# Patient Record
Sex: Male | Born: 1965
Health system: Southern US, Community
[De-identification: ages and names within clinical notes are randomized; demographics above are authoritative.]

## PROBLEM LIST (undated history)

## (undated) DIAGNOSIS — G4733 Obstructive sleep apnea (adult) (pediatric): Secondary | ICD-10-CM

## (undated) DIAGNOSIS — F99 Mental disorder, not otherwise specified: Secondary | ICD-10-CM

## (undated) DIAGNOSIS — G4730 Sleep apnea, unspecified: Secondary | ICD-10-CM

## (undated) DIAGNOSIS — K118 Other diseases of salivary glands: Secondary | ICD-10-CM

## (undated) DIAGNOSIS — K219 Gastro-esophageal reflux disease without esophagitis: Secondary | ICD-10-CM

## (undated) DIAGNOSIS — F32A Depression, unspecified: Secondary | ICD-10-CM

## (undated) DIAGNOSIS — F329 Major depressive disorder, single episode, unspecified: Secondary | ICD-10-CM

## (undated) DIAGNOSIS — N209 Urinary calculus, unspecified: Secondary | ICD-10-CM

## (undated) DIAGNOSIS — Z8719 Personal history of other diseases of the digestive system: Secondary | ICD-10-CM

## (undated) DIAGNOSIS — F419 Anxiety disorder, unspecified: Secondary | ICD-10-CM

## (undated) HISTORY — DX: Anxiety disorder, unspecified: F41.9

## (undated) HISTORY — DX: Obstructive sleep apnea (adult) (pediatric): G47.33

---

## 1999-01-18 ENCOUNTER — Emergency Department (HOSPITAL_COMMUNITY): Admission: EM | Admit: 1999-01-18 | Discharge: 1999-01-18 | Payer: Self-pay | Admitting: Emergency Medicine

## 1999-01-18 ENCOUNTER — Encounter: Payer: Self-pay | Admitting: Emergency Medicine

## 2000-09-28 ENCOUNTER — Encounter (INDEPENDENT_AMBULATORY_CARE_PROVIDER_SITE_OTHER): Payer: Self-pay | Admitting: Specialist

## 2000-09-28 ENCOUNTER — Ambulatory Visit (HOSPITAL_BASED_OUTPATIENT_CLINIC_OR_DEPARTMENT_OTHER): Admission: RE | Admit: 2000-09-28 | Discharge: 2000-09-28 | Payer: Self-pay | Admitting: Unknown Physician Specialty

## 2001-07-24 ENCOUNTER — Emergency Department (HOSPITAL_COMMUNITY): Admission: EM | Admit: 2001-07-24 | Discharge: 2001-07-24 | Payer: Self-pay | Admitting: Emergency Medicine

## 2001-07-24 ENCOUNTER — Encounter: Payer: Self-pay | Admitting: Emergency Medicine

## 2003-10-28 ENCOUNTER — Encounter (INDEPENDENT_AMBULATORY_CARE_PROVIDER_SITE_OTHER): Payer: Self-pay | Admitting: *Deleted

## 2003-10-28 ENCOUNTER — Ambulatory Visit (HOSPITAL_COMMUNITY): Admission: RE | Admit: 2003-10-28 | Discharge: 2003-10-28 | Payer: Self-pay | Admitting: *Deleted

## 2004-05-08 ENCOUNTER — Encounter: Admission: RE | Admit: 2004-05-08 | Discharge: 2004-05-08 | Payer: Self-pay | Admitting: Neurological Surgery

## 2005-05-29 ENCOUNTER — Encounter: Admission: RE | Admit: 2005-05-29 | Discharge: 2005-05-29 | Payer: Self-pay | Admitting: Family Medicine

## 2009-04-20 ENCOUNTER — Emergency Department (HOSPITAL_COMMUNITY): Admission: EM | Admit: 2009-04-20 | Discharge: 2009-04-20 | Payer: Self-pay | Admitting: Emergency Medicine

## 2009-11-03 ENCOUNTER — Emergency Department (HOSPITAL_COMMUNITY): Admission: EM | Admit: 2009-11-03 | Discharge: 2009-11-03 | Payer: Self-pay | Admitting: Emergency Medicine

## 2010-06-22 ENCOUNTER — Emergency Department (HOSPITAL_COMMUNITY): Admission: EM | Admit: 2010-06-22 | Discharge: 2010-06-23 | Payer: Self-pay | Admitting: Emergency Medicine

## 2010-10-25 LAB — URINALYSIS, ROUTINE W REFLEX MICROSCOPIC
Specific Gravity, Urine: 1.032 — ABNORMAL HIGH (ref 1.005–1.030)
Urobilinogen, UA: 0.2 mg/dL (ref 0.0–1.0)
pH: 5 (ref 5.0–8.0)

## 2010-10-25 LAB — POCT I-STAT, CHEM 8
Calcium, Ion: 1.19 mmol/L (ref 1.12–1.32)
Glucose, Bld: 124 mg/dL — ABNORMAL HIGH (ref 70–99)
HCT: 50 % (ref 39.0–52.0)
Hemoglobin: 17 g/dL (ref 13.0–17.0)
Potassium: 4.7 mEq/L (ref 3.5–5.1)

## 2010-10-25 LAB — URINE MICROSCOPIC-ADD ON

## 2010-11-06 LAB — BASIC METABOLIC PANEL
BUN: 17 mg/dL (ref 6–23)
Calcium: 9.2 mg/dL (ref 8.4–10.5)
Creatinine, Ser: 0.99 mg/dL (ref 0.4–1.5)
GFR calc non Af Amer: >60 mL/min (ref >60)
Glucose, Bld: 99 mg/dL (ref 70–99)

## 2010-11-06 LAB — URINALYSIS, ROUTINE W REFLEX MICROSCOPIC
Bilirubin Urine: NEGATIVE
Ketones, ur: NEGATIVE mg/dL
Nitrite: NEGATIVE
Specific Gravity, Urine: 1.031 — ABNORMAL HIGH (ref 1.005–1.030)
Urobilinogen, UA: 0.2 mg/dL (ref 0.0–1.0)

## 2010-11-06 LAB — DIFFERENTIAL
Basophils Absolute: 0 10*3/uL (ref 0.0–0.1)
Lymphocytes Relative: 42 % (ref 12–46)
Neutro Abs: 4.5 10*3/uL (ref 1.7–7.7)

## 2010-11-06 LAB — CBC
Platelets: 245 10*3/uL (ref 150–400)
RDW: 13.9 % (ref 11.5–15.5)

## 2010-11-18 LAB — GLUCOSE, CAPILLARY: Glucose-Capillary: 108 mg/dL — ABNORMAL HIGH (ref 70–99)

## 2010-12-30 NOTE — Op Note (Signed)
[facility]  Patient:    Matthew [name], Matthew [name]                         MRN: [id number] Proc. Date: 09/28/00 Adm. Date:  [id number] Disc. Date: [id number] Attending:  Caleen Essex                           Operative Report  PREOPERATIVE DIAGNOSIS:  Mass on his right low back.  POSTOPERATIVE DIAGNOSIS:  Mass on his right low back.  PROCEDURE:  Excision of the mass on the right low back.  SURGEON:  Chevis Pretty, M.D.  ANESTHESIA:  Local with 1% lidocaine with epinephrine.  DESCRIPTION OF PROCEDURE:  After informed consent was obtained, the patient was brought to the operating room and placed in the prone position on the operating room table.  After patients right low back area was prepped with Betadine and draped in the usual sterile manner, the area in question was then infiltrated with 1% lidocaine with epinephrine, and this was massaged into the tissue for several minutes.  An elliptical incision was used to carve out the area in question where previous scar tissue had been.  This was approximately 4-5 cm in diameter.  The incision was carried down through the skin and subcutaneous tissues with the 15 blade knife until the entire area in question had been excised.  The tissue in the base of the wound all appeared to be healthy subcutaneous tissue.  Hemostasis was achieved using the Bovie electrocautery, and the incision was then closed with multiple interrupted vertical mattress sutures using a 2-0 nylon stitch.  Sterile dressing was applied.  The patient tolerated the procedure well.  At the end of the case, all needle, sponge, and instrument counts were correct.  Patient was taken to the recovery room in stable condition. DD:  10/01/00 TD:  10/02/00 Job: [id number]

## 2012-01-18 ENCOUNTER — Encounter (HOSPITAL_COMMUNITY): Payer: Self-pay | Admitting: *Deleted

## 2012-01-18 ENCOUNTER — Emergency Department (HOSPITAL_COMMUNITY): Payer: 59

## 2012-01-18 ENCOUNTER — Emergency Department (HOSPITAL_COMMUNITY)
Admission: EM | Admit: 2012-01-18 | Discharge: 2012-01-19 | Disposition: A | Discharge status: Home / Self Care | Payer: 59 | Attending: Emergency Medicine | Admitting: Emergency Medicine

## 2012-01-18 DIAGNOSIS — Z87442 Personal history of urinary calculi: Secondary | ICD-10-CM | POA: Insufficient documentation

## 2012-01-18 DIAGNOSIS — Z79899 Other long term (current) drug therapy: Secondary | ICD-10-CM | POA: Insufficient documentation

## 2012-01-18 DIAGNOSIS — N23 Unspecified renal colic: Secondary | ICD-10-CM

## 2012-01-18 DIAGNOSIS — F17200 Nicotine dependence, unspecified, uncomplicated: Secondary | ICD-10-CM | POA: Insufficient documentation

## 2012-01-18 DIAGNOSIS — R109 Unspecified abdominal pain: Secondary | ICD-10-CM | POA: Insufficient documentation

## 2012-01-18 LAB — URINALYSIS, ROUTINE W REFLEX MICROSCOPIC
Bilirubin Urine: NEGATIVE
Glucose, UA: NEGATIVE mg/dL
Ketones, ur: NEGATIVE mg/dL
Leukocytes, UA: NEGATIVE
Protein, ur: NEGATIVE mg/dL

## 2012-01-18 MED ORDER — ONDANSETRON HCL 4 MG/2ML IJ SOLN
4.0000 mg | Freq: Once | INTRAMUSCULAR | Status: AC
  Administered 2012-01-18: 4 mg via INTRAVENOUS
  Filled 2012-01-18: qty 2

## 2012-01-18 MED ORDER — HYDROMORPHONE HCL PF 1 MG/ML IJ SOLN
2.0000 mg | Freq: Once | INTRAMUSCULAR | Status: AC
  Administered 2012-01-18: 2 mg via INTRAVENOUS
  Filled 2012-01-18: qty 2

## 2012-01-18 MED ORDER — SODIUM CHLORIDE 0.9 % IV SOLN
Freq: Once | INTRAVENOUS | Status: AC
  Administered 2012-01-18: via INTRAVENOUS

## 2012-01-18 NOTE — ED Provider Notes (Signed)
History     CSN: [id number]  Arrival date & time 01/18/12  2229   First MD Initiated Contact with Patient 01/18/12 2303      Chief Complaint  Patient presents with  . Flank Pain    (Consider location/radiation/quality/duration/timing/severity/associated sxs/prior treatment) Patient is a 46 y.o. male presenting with flank pain. The history is provided by the patient and the spouse.  Flank Pain   patient here with left-sided flank pain was within 90 minutes ago. Has been colicky and similar to his history of renal stones. No hematuria or dysuria. No fever. Denies any vomiting or diarrhea. No medications taken prior to arrival. Nothing makes her symptoms better worse. Last kidney stone was approximately one year ago  Past Medical History  Diagnosis Date  . Kidney stone     History reviewed. No pertinent past surgical history.  History reviewed. No pertinent family history.  History  Substance Use Topics  . Smoking status: Current Some Day Smoker  . Smokeless tobacco: Not on file  . Alcohol Use:       Review of Systems  Genitourinary: Positive for flank pain.  All other systems reviewed and are negative.    Allergies  Review of patient's allergies indicates no known allergies.  Home Medications   Current Outpatient Rx  Name Route Sig Dispense Refill  . AMITRIPTYLINE HCL 100 MG PO TABS Oral Take 100 mg by mouth daily.    . BUSPIRONE HCL 15 MG PO TABS Oral Take 15 mg by mouth daily.    Marland Kitchen CLONAZEPAM 2 MG PO TABS Oral Take 2 mg by mouth 2 (two) times daily as needed. For anxiety    . FENOFIBRATE 48 MG PO TABS Oral Take 48 mg by mouth daily.    Marland Kitchen METFORMIN HCL 500 MG PO TABS Oral Take 500 mg by mouth daily.    Marland Kitchen OMEPRAZOLE 20 MG PO CPDR Oral Take 20 mg by mouth daily.      BP 122/85  Pulse 86  Temp(Src) 98.1 F (36.7 C) (Oral)  Resp 20  Wt 300 lb 2 oz (136.136 kg)  SpO2 97%  Physical Exam  Nursing note and vitals reviewed. Constitutional: He is oriented to  person, place, and time. He appears well-developed and well-nourished.  Non-toxic appearance. No distress.  HENT:  Head: Normocephalic and atraumatic.  Eyes: Conjunctivae, EOM and lids are normal. Pupils are equal, round, and reactive to light.  Neck: Normal range of motion. Neck supple. No tracheal deviation present. No mass present.  Cardiovascular: Normal rate, regular rhythm and normal heart sounds.  Exam reveals no gallop.   No murmur heard. Pulmonary/Chest: Effort normal and breath sounds normal. No stridor. No respiratory distress. He has no decreased breath sounds. He has no wheezes. He has no rhonchi. He has no rales.  Abdominal: Soft. Normal appearance and bowel sounds are normal. He exhibits no distension. There is no tenderness. There is CVA tenderness. There is no rigidity, no rebound and no guarding.  Musculoskeletal: Normal range of motion. He exhibits no edema and no tenderness.  Neurological: He is alert and oriented to person, place, and time. He has normal strength. No cranial nerve deficit or sensory deficit. GCS eye subscore is 4. GCS verbal subscore is 5. GCS motor subscore is 6.  Skin: Skin is warm and dry. No abrasion and no rash noted.  Psychiatric: He has a normal mood and affect. His speech is normal and behavior is normal.    ED Course  Procedures (  including critical care time)   Labs Reviewed  URINALYSIS, ROUTINE W REFLEX MICROSCOPIC  URINE CULTURE   No results found.   No diagnosis found.    MDM  Patient given medication here and feels better.  We'll give referral to urology on call        Toy Baker, MD 01/19/12 [phone]

## 2012-01-18 NOTE — ED Notes (Signed)
Pt c/o left flank pain x 90 mins; known kidney stone

## 2012-01-19 MED ORDER — OXYCODONE-ACETAMINOPHEN 5-325 MG PO TABS: 2.0000 | ORAL_TABLET | ORAL | Status: AC | PRN

## 2012-01-19 MED ORDER — TAMSULOSIN HCL 0.4 MG PO CAPS: 0.4000 mg | ORAL_CAPSULE | Freq: Every day | ORAL | Status: DC

## 2012-01-20 LAB — URINE CULTURE: Colony Count: NO GROWTH

## 2012-03-20 ENCOUNTER — Other Ambulatory Visit: Payer: Self-pay | Admitting: Family

## 2012-03-20 ENCOUNTER — Ambulatory Visit (INDEPENDENT_AMBULATORY_CARE_PROVIDER_SITE_OTHER): Payer: 59 | Admitting: Family

## 2012-03-20 ENCOUNTER — Encounter: Payer: Self-pay | Admitting: Family

## 2012-03-20 VITALS — BP 116/78 | HR 97 | Ht 71.0 in | Wt 300.0 lb

## 2012-03-20 DIAGNOSIS — G4700 Insomnia, unspecified: Secondary | ICD-10-CM

## 2012-03-20 DIAGNOSIS — M79673 Pain in unspecified foot: Secondary | ICD-10-CM

## 2012-03-20 DIAGNOSIS — E119 Type 2 diabetes mellitus without complications: Secondary | ICD-10-CM

## 2012-03-20 DIAGNOSIS — K113 Abscess of salivary gland: Secondary | ICD-10-CM

## 2012-03-20 DIAGNOSIS — E7800 Pure hypercholesterolemia, unspecified: Secondary | ICD-10-CM

## 2012-03-20 DIAGNOSIS — M79609 Pain in unspecified limb: Secondary | ICD-10-CM

## 2012-03-20 LAB — BASIC METABOLIC PANEL
BUN: 11 mg/dL (ref 6–23)
CO2: 25 mEq/L (ref 19–32)
Chloride: 104 mEq/L (ref 96–112)
GFR: 100.95 mL/min (ref >60.00)
Glucose, Bld: 117 mg/dL — ABNORMAL HIGH (ref 70–99)
Potassium: 3.9 mEq/L (ref 3.5–5.1)

## 2012-03-20 LAB — LIPID PANEL
Cholesterol: 159 mg/dL (ref 0–200)
LDL Cholesterol: 77 mg/dL (ref 0–99)
Triglycerides: 163 mg/dL — ABNORMAL HIGH (ref 0.0–149.0)

## 2012-03-20 MED ORDER — OMEPRAZOLE 20 MG PO CPDR: 20.0000 mg | DELAYED_RELEASE_CAPSULE | Freq: Every day | ORAL | Status: DC

## 2012-03-20 MED ORDER — DOXYCYCLINE HYCLATE 100 MG PO CPEP: 100.0000 mg | ORAL_CAPSULE | Freq: Two times a day (BID) | ORAL | Status: DC | PRN

## 2012-03-20 MED ORDER — AMITRIPTYLINE HCL 100 MG PO TABS: 100.0000 mg | ORAL_TABLET | Freq: Every day | ORAL | Status: DC

## 2012-03-20 MED ORDER — CLONAZEPAM 2 MG PO TABS: 2.0000 mg | ORAL_TABLET | Freq: Two times a day (BID) | ORAL | Status: DC | PRN

## 2012-03-20 MED ORDER — FENOFIBRATE 48 MG PO TABS: 48.0000 mg | ORAL_TABLET | Freq: Every day | ORAL | Status: DC

## 2012-03-20 NOTE — Progress Notes (Signed)
Subjective:    Patient ID: Matthew [name], male    DOB: 12-18-1965, 46 y.o.   MRN: [id number]  HPI 46 year old white male, new patient to the practice is in to be established. He has a history of type 2 diabetes, insomnia, anxiety, and GERD. He is currently stable on his medications. Last A1c check was 6.0 about 6 months ago. He has complaints of left heel pain that's been going on for about 3 months recipient of 10 out of 10 it is worse with walking. His taken over-the-counter medications with no relief.   Patient also has concerns of a tender area to the right side of his face has been present x1 week. The area is swollen, mildly tender to touch. He denies any dental abscesses. No sneezing coughing or congestion. He has not taken anything for relief.   Review of Systems  Constitutional: Negative.   HENT: Negative.   Eyes: Negative.   Respiratory: Negative.   Cardiovascular: Negative.   Gastrointestinal: Negative.   Genitourinary: Negative.  Negative for urgency and frequency.  Musculoskeletal: Negative.   Skin: Negative for color change, pallor and rash.       Tenderness and moderate swelling to the right side of his face. No redness  Neurological: Negative.   Hematological: Negative.   Psychiatric/Behavioral: Negative.    Past Medical History  Diagnosis Date  . Kidney stone   . Diabetes mellitus   . Hyperlipidemia     History   Social History  . Marital Status: Single    Spouse Name: N/A    Number of Children: N/A  . Years of Education: N/A   Occupational History  . Not on file.   Social History Main Topics  . Smoking status: Former Games developer  . Smokeless tobacco: Not on file  . Alcohol Use: Yes  . Drug Use: Yes    Special: Marijuana  . Sexually Active: Not on file   Other Topics Concern  . Not on file   Social History Narrative  . No narrative on file    No past surgical history on file.  Family History  Problem Relation Age of Onset  . Cancer Mother    breast  . Diabetes Father   . Stroke Brother     No Known Allergies  Current Outpatient Prescriptions on File Prior to Visit  Medication Sig Dispense Refill  . busPIRone (BUSPAR) 15 MG tablet Take 15 mg by mouth daily.      . metFORMIN (GLUCOPHAGE) 500 MG tablet Take 500 mg by mouth daily.      Marland Kitchen DISCONTD: amitriptyline (ELAVIL) 100 MG tablet Take 100 mg by mouth daily.      Marland Kitchen DISCONTD: clonazePAM (KLONOPIN) 2 MG tablet Take 2 mg by mouth 2 (two) times daily as needed. For anxiety      . DISCONTD: fenofibrate (TRICOR) 48 MG tablet Take 48 mg by mouth daily.      Marland Kitchen DISCONTD: omeprazole (PRILOSEC) 20 MG capsule Take 20 mg by mouth daily.        BP 116/78  Pulse 97  Ht 5\' 11"  (1.803 m)  Wt 300 lb (136.079 kg)  BMI 41.84 kg/m2  SpO2 98%chart    Objective:   Physical Exam  Constitutional: He is oriented to person, place, and time. He appears well-developed and well-nourished.  HENT:  Right Ear: External ear normal.  Left Ear: External ear normal.  Nose: Nose normal.  Mouth/Throat: Oropharynx is clear and moist.  Eyes: Conjunctivae and EOM are  normal. Pupils are equal, round, and reactive to light.  Neck: Normal range of motion. Neck supple. No thyromegaly present.  Cardiovascular: Normal rate, regular rhythm and normal heart sounds.   Pulmonary/Chest: Effort normal and breath sounds normal.  Abdominal: Soft. Bowel sounds are normal.  Musculoskeletal: Normal range of motion.  Neurological: He is alert and oriented to person, place, and time.  Skin: Skin is warm and dry.  Psychiatric: He has a normal mood and affect.          Assessment & Plan:  Assessment: Type 2 diabetes, insomnia, anxiety, GERD, parotid abscess, left heel pain  Plan: Doxycycline 100 mg twice a day x10 days. Medications refilled. Lab sent. Encouraged healthy diet and exercise. X-ray of the left heel. Will notify patient pending results. Patient call the office if symptoms worsen or persist. Recheck a  schedule, in 3 months, and when necessary.

## 2012-03-20 NOTE — Patient Instructions (Addendum)

## 2012-03-29 ENCOUNTER — Other Ambulatory Visit: Payer: Self-pay | Admitting: Family

## 2012-03-29 ENCOUNTER — Encounter: Payer: Self-pay | Admitting: Family

## 2012-03-29 ENCOUNTER — Telehealth: Payer: Self-pay | Admitting: Family Medicine

## 2012-03-29 ENCOUNTER — Ambulatory Visit (INDEPENDENT_AMBULATORY_CARE_PROVIDER_SITE_OTHER): Payer: 59 | Admitting: Family

## 2012-03-29 VITALS — BP 132/90 | HR 60 | Temp 98.5°F | Wt 298.0 lb

## 2012-03-29 DIAGNOSIS — M79672 Pain in left foot: Secondary | ICD-10-CM

## 2012-03-29 DIAGNOSIS — E119 Type 2 diabetes mellitus without complications: Secondary | ICD-10-CM

## 2012-03-29 DIAGNOSIS — K113 Abscess of salivary gland: Secondary | ICD-10-CM

## 2012-03-29 DIAGNOSIS — M79609 Pain in unspecified limb: Secondary | ICD-10-CM

## 2012-03-29 MED ORDER — TRAMADOL HCL 50 MG PO TABS: 50.0000 mg | ORAL_TABLET | Freq: Three times a day (TID) | ORAL | Status: AC | PRN

## 2012-03-29 NOTE — Patient Instructions (Addendum)
Diabetes and Exercise Regular exercise is important and can help:   Control blood glucose (sugar).   Decrease blood pressure.    Control blood lipids (cholesterol, triglycerides).   Improve overall health.  BENEFITS FROM EXERCISE  Improved fitness.   Improved flexibility.   Improved endurance.   Increased bone density.   Weight control.   Increased muscle strength.   Decreased body fat.   Improvement of the body's use of insulin, a hormone.   Increased insulin sensitivity.   Reduction of insulin needs.   Reduced stress and tension.   Helps you feel better.  People with diabetes who add exercise to their lifestyle gain additional benefits, including:  Weight loss.   Reduced appetite.   Improvement of the body's use of blood glucose.   Decreased risk factors for heart disease:   Lowering of cholesterol and triglycerides.   Raising the level of good cholesterol (high-density lipoproteins, HDL).   Lowering blood sugar.   Decreased blood pressure.  TYPE 1 DIABETES AND EXERCISE  Exercise will usually lower your blood glucose.   If blood glucose is greater than 240 mg/dl, check urine ketones. If ketones are present, do not exercise.   Location of the insulin injection sites may need to be adjusted with exercise. Avoid injecting insulin into areas of the body that will be exercised. For example, avoid injecting insulin into:   The arms when playing tennis.   The legs when jogging. For more information, discuss this with your caregiver.   Keep a record of:   Food intake.   Type and amount of exercise.   Expected peak times of insulin action.   Blood glucose levels.  Do this before, during, and after exercise. Review your records with your caregiver. This will help you to develop guidelines for adjusting food intake and insulin amounts.  TYPE 2 DIABETES AND EXERCISE  Regular physical activity can help control blood glucose.   Exercise is important  because it may:   Increase the body's sensitivity to insulin.   Improve blood glucose control.   Exercise reduces the risk of heart disease. It decreases serum cholesterol and triglycerides. It also lowers blood pressure.   Those who take insulin or oral hypoglycemic agents should watch for signs of hypoglycemia. These signs include dizziness, shaking, sweating, chills, and confusion.   Body water is lost during exercise. It must be replaced. This will help to avoid loss of body fluids (dehydration) or heat stroke.  Be sure to talk to your caregiver before starting an exercise program to make sure it is safe for you. Remember, any activity is better than none.  Document Released: 10/21/2003 Document Revised: 07/20/2011 Document Reviewed: 02/04/2009 ExitCare Patient Information 2012 [employer].   Diabetes Meal Planning Guide The diabetes meal planning guide is a tool to help you plan your meals and snacks. It is important for people with diabetes to manage their blood glucose (sugar) levels. Choosing the right foods and the right amounts throughout your day will help control your blood glucose. Eating right can even help you improve your blood pressure and reach or maintain a healthy weight. CARBOHYDRATE COUNTING MADE EASY When you eat carbohydrates, they turn to sugar. This raises your blood glucose level. Counting carbohydrates can help you control this level so you feel better. When you plan your meals by counting carbohydrates, you can have more flexibility in what you eat and balance your medicine with your food intake. Carbohydrate counting simply means adding up the total   amount of carbohydrate grams in your meals and snacks. Try to eat about the same amount at each meal. Foods with carbohydrates are listed below. Each portion below is 1 carbohydrate serving or 15 grams of carbohydrates. Ask your dietician how many grams of carbohydrates you should eat at each meal or snack. Grains  and Starches 1 slice bread.   English muffin or hotdog/hamburger bun.   cup cold cereal (unsweetened).  ? cup cooked pasta or rice.   cup starchy vegetables (corn, potatoes, peas, beans, winter squash).  1 tortilla (6 inches).   bagel.  1 waffle or pancake (size of a CD).   cup cooked cereal.  4 to 6 small crackers.  *Whole grain is recommended. Fruit 1 cup fresh unsweetened berries, melon, papaya, pineapple.  1 small fresh fruit.   banana or mango.   cup fruit juice (4 oz unsweetened).   cup canned fruit in natural juice or water.  2 tbs dried fruit.  12 to 15 grapes or cherries.  Milk and Yogurt 1 cup fat-free or 1% milk.  1 cup soy milk.  6 oz light yogurt with sugar-free sweetener.  6 oz low-fat soy yogurt.  6 oz plain yogurt.  Vegetables 1 cup raw or  cup cooked is counted as 0 carbohydrates or a "free" food.  If you eat 3 or more servings at 1 meal, count them as 1 carbohydrate serving.  Other Carbohydrates  oz chips or pretzels.   cup ice cream or frozen yogurt.   cup sherbet or sorbet.  2 inch square cake, no frosting.  1 tbs honey, sugar, jam, jelly, or syrup.  2 small cookies.  3 squares of graham crackers.  3 cups popcorn.  6 crackers.  1 cup broth-based soup.  Count 1 cup casserole or other mixed foods as 2 carbohydrate servings.  Foods with less than 20 calories in a serving may be counted as 0 carbohydrates or a "free" food.  You may want to purchase a book or computer software that lists the carbohydrate gram counts of different foods. In addition, the nutrition facts panel on the labels of the foods you eat are a good source of this information. The label will tell you how big the serving size is and the total number of carbohydrate grams you will be eating per serving. Divide this number by 15 to obtain the number of carbohydrate servings in a portion. Remember, 1 carbohydrate serving equals 15 grams of carbohydrate. SERVING SIZES Measuring  foods and serving sizes helps you make sure you are getting the right amount of food. The list below tells how big or small some common serving sizes are. 1 oz.........4 stacked dice.  3 oz.........Deck of cards.  1 tsp........Tip of little finger.  1 tbs........Thumb.  2 tbs........Golf ball.   cup.......Half of a fist.  1 cup........A fist.  SAMPLE DIABETES MEAL PLAN Below is a sample meal plan that includes foods from the grain and starches, dairy, vegetable, fruit, and meat groups. A dietician can individualize a meal plan to fit your calorie needs and tell you the number of servings needed from each food group. However, controlling the total amount of carbohydrates in your meal or snack is more important than making sure you include all of the food groups at every meal. You may interchange carbohydrate containing foods (dairy, starches, and fruits). The meal plan below is an example of a 2000 calorie diet using carbohydrate counting. This meal plan has 17 carbohydrate servings. Breakfast 1   cup oatmeal (2 carb servings).   cup light yogurt (1 carb serving).  1 cup blueberries (1 carb serving).   cup almonds.  Snack 1 large apple (2 carb servings).  1 low-fat string cheese stick.  Lunch Chicken breast salad.  1 cup spinach.   cup chopped tomatoes.  2 oz chicken breast, sliced.  2 tbs low-fat Italian dressing.  12 whole-wheat crackers (2 carb servings).  12 to 15 grapes (1 carb serving).  1 cup low-fat milk (1 carb serving).  Snack 1 cup carrots.   cup hummus (1 carb serving).  Dinner 3 oz broiled salmon.  1 cup brown rice (3 carb servings).  Snack 1  cups steamed broccoli (1 carb serving) drizzled with 1 tsp olive oil and lemon juice.  1 cup light pudding (2 carb servings).  DIABETES MEAL PLANNING WORKSHEET Your dietician can use this worksheet to help you decide how many servings of foods and what types of foods are right for you.  BREAKFAST Food Group and Servings /  Carb Servings Grain/Starches __________________________________ Dairy __________________________________________ Vegetable ______________________________________ Fruit ___________________________________________ Meat __________________________________________ Fat ____________________________________________ LUNCH Food Group and Servings / Carb Servings Grain/Starches ___________________________________ Dairy ___________________________________________ Fruit ____________________________________________ Meat ___________________________________________ Fat _____________________________________________ DINNER Food Group and Servings / Carb Servings Grain/Starches ___________________________________ Dairy ___________________________________________ Fruit ____________________________________________ Meat ___________________________________________ Fat _____________________________________________ SNACKS Food Group and Servings / Carb Servings Grain/Starches ___________________________________ Dairy ___________________________________________ Vegetable _______________________________________ Fruit ____________________________________________ Meat ___________________________________________ Fat _____________________________________________ DAILY TOTALS Starches _________________________ Vegetable ________________________ Fruit ____________________________ Dairy ____________________________ Meat ____________________________ Fat ______________________________ Document Released: 04/27/2005 Document Revised: 07/20/2011 Document Reviewed: 03/08/2009 ExitCare Patient Information 2012 [employer].    

## 2012-03-29 NOTE — Telephone Encounter (Signed)
Radiology notification: this patient did not complete the foot films you ordered 03/20/12

## 2012-03-29 NOTE — Progress Notes (Signed)
Subjective:    Patient ID: Matthew [name], male    DOB: 1966/02/01, 46 y.o.   MRN: [id number]  HPI 46 year old white male is in for recheck of an abscess of the parotid gland x1 week. He's been on doxycycline twice a day x7 days. Medication has not helped.  Patient is also requesting referral to podiatry for evaluation of his foot pain. I advised an x-ray of his last office visit patient has not gone to get an x-ray. Has also requesting medication for pain. Not currently taken anything.  Patient has a history of newly diagnosed type 2 diabetes that is currently being diet controlled. He is requesting a referral to a nutritionist.   Review of Systems  Constitutional: Negative.   HENT: Negative.        Mass to the right face  Respiratory: Negative.   Cardiovascular: Negative.   Musculoskeletal: Negative.   Skin: Negative.   Neurological: Negative.   Hematological: Negative.   Psychiatric/Behavioral: Negative.    Past Medical History  Diagnosis Date  . Kidney stone   . Diabetes mellitus   . Hyperlipidemia     History   Social History  . Marital Status: Single    Spouse Name: N/A    Number of Children: N/A  . Years of Education: N/A   Occupational History  . Not on file.   Social History Main Topics  . Smoking status: Former Games developer  . Smokeless tobacco: Not on file  . Alcohol Use: Yes  . Drug Use: Yes    Special: Marijuana  . Sexually Active: Not on file   Other Topics Concern  . Not on file   Social History Narrative  . No narrative on file    No past surgical history on file.  Family History  Problem Relation Age of Onset  . Cancer Mother     breast  . Diabetes Father   . Stroke Brother     No Known Allergies  Current Outpatient Prescriptions on File Prior to Visit  Medication Sig Dispense Refill  . amitriptyline (ELAVIL) 100 MG tablet Take 1 tablet (100 mg total) by mouth daily.  30 tablet  5  . busPIRone (BUSPAR) 15 MG tablet Take 15 mg by mouth  daily.      . clonazePAM (KLONOPIN) 2 MG tablet Take 1 tablet (2 mg total) by mouth 2 (two) times daily as needed. For anxiety  60 tablet  3  . doxycycline (DORYX) 100 MG DR capsule Take 1 capsule (100 mg total) by mouth 2 (two) times daily as needed.  20 capsule  0  . fenofibrate (TRICOR) 48 MG tablet Take 1 tablet (48 mg total) by mouth daily.  30 tablet  5  . metFORMIN (GLUCOPHAGE) 500 MG tablet Take 500 mg by mouth daily.      Marland Kitchen omeprazole (PRILOSEC) 20 MG capsule Take 1 capsule (20 mg total) by mouth daily.  30 capsule  5    BP 132/90  Pulse 60  Temp 98.5 F (36.9 C) (Oral)  Wt 298 lb (135.172 kg)  SpO2 97%chart    Objective:   Physical Exam  Constitutional: He is oriented to person, place, and time. He appears well-developed and well-nourished.  HENT:  Right Ear: External ear normal.  Left Ear: External ear normal.  Nose: Nose normal.  Mouth/Throat: Oropharynx is clear and moist.       Large, firm, palpable mass noted to the right face just below the tragus of the ear.  Neck:  Normal range of motion. Neck supple.  Pulmonary/Chest: Effort normal and breath sounds normal.  Abdominal: Soft. Bowel sounds are normal.  Neurological: He is alert and oriented to person, place, and time.  Skin: Skin is warm and dry.  Psychiatric: He has a normal mood and affect.          Assessment & Plan:  Assessment: Parotid abscess, type 2 diabetes, left foot pain  Plan: Advised him to get the x-ray of his left foot. Tramadol when necessary for pain. Referred to nutritionist. Encouraged healthy diet, exercise, weight reduction. Ultrasound of the soft tissue order to evaluate what appears to be a parotid mass. We'll follow up patient of results.

## 2012-04-03 ENCOUNTER — Ambulatory Visit
Admission: RE | Admit: 2012-04-03 | Discharge: 2012-04-03 | Disposition: A | Discharge status: Home / Self Care | Payer: 59 | Source: Ambulatory Visit | Attending: Family | Admitting: Family

## 2012-04-03 DIAGNOSIS — K113 Abscess of salivary gland: Secondary | ICD-10-CM

## 2012-04-08 ENCOUNTER — Other Ambulatory Visit: Payer: Self-pay | Admitting: Family

## 2012-04-08 DIAGNOSIS — K118 Other diseases of salivary glands: Secondary | ICD-10-CM

## 2012-04-09 ENCOUNTER — Other Ambulatory Visit: Payer: Self-pay | Admitting: Family

## 2012-04-10 NOTE — Progress Notes (Signed)
Quick Note:  Left a message for pt to return call. ______ 

## 2012-04-11 ENCOUNTER — Other Ambulatory Visit: Payer: 59

## 2012-04-17 ENCOUNTER — Telehealth: Payer: Self-pay | Admitting: Family

## 2012-04-17 NOTE — Telephone Encounter (Signed)
Pts mom called and said that pt went to get an ultrasound done and was told that he would need to get a CT Scan done.

## 2012-04-17 NOTE — Telephone Encounter (Signed)
Pt aware of [place] results and Padonda's note. CT scan scheduled, per pt

## 2012-05-06 ENCOUNTER — Other Ambulatory Visit: Payer: 59

## 2012-05-17 ENCOUNTER — Telehealth: Payer: Self-pay | Admitting: Family

## 2012-05-17 DIAGNOSIS — R221 Localized swelling, mass and lump, neck: Secondary | ICD-10-CM

## 2012-05-17 NOTE — Telephone Encounter (Signed)
Rose from CT called concerning pt. He has rescheduled his CT several times, when appt was scheduled he has resent labs but are no longer in [facility] for the ct.Can you please place a STAT lab order for BUN and Creat?

## 2012-05-20 ENCOUNTER — Inpatient Hospital Stay: Admission: RE | Admit: 2012-05-20 | Payer: 59 | Source: Ambulatory Visit

## 2012-05-20 ENCOUNTER — Other Ambulatory Visit (INDEPENDENT_AMBULATORY_CARE_PROVIDER_SITE_OTHER): Payer: 59

## 2012-05-20 DIAGNOSIS — R220 Localized swelling, mass and lump, head: Secondary | ICD-10-CM

## 2012-05-20 DIAGNOSIS — R221 Localized swelling, mass and lump, neck: Secondary | ICD-10-CM

## 2012-05-20 LAB — COMPREHENSIVE METABOLIC PANEL
CO2: 30 mEq/L (ref 19–32)
Creatinine, Ser: 1.1 mg/dL (ref 0.4–1.5)
GFR: 76.74 mL/min (ref >60.00)
Glucose, Bld: 90 mg/dL (ref 70–99)
Total Bilirubin: 0.4 mg/dL (ref 0.3–1.2)

## 2012-05-20 LAB — BUN: BUN: 11 mg/dL (ref 6–23)

## 2012-05-24 ENCOUNTER — Other Ambulatory Visit: Payer: Self-pay | Admitting: Family

## 2012-05-24 ENCOUNTER — Ambulatory Visit (INDEPENDENT_AMBULATORY_CARE_PROVIDER_SITE_OTHER)
Admission: RE | Admit: 2012-05-24 | Discharge: 2012-05-24 | Disposition: A | Discharge status: Home / Self Care | Payer: 59 | Source: Ambulatory Visit | Attending: Family | Admitting: Family

## 2012-05-24 DIAGNOSIS — R221 Localized swelling, mass and lump, neck: Secondary | ICD-10-CM

## 2012-05-24 DIAGNOSIS — R220 Localized swelling, mass and lump, head: Secondary | ICD-10-CM

## 2012-05-24 MED ORDER — IOHEXOL 300 MG/ML  SOLN
80.0000 mL | Freq: Once | INTRAMUSCULAR | Status: AC | PRN
  Administered 2012-05-24: 80 mL via INTRAVENOUS

## 2012-06-05 ENCOUNTER — Ambulatory Visit (HOSPITAL_BASED_OUTPATIENT_CLINIC_OR_DEPARTMENT_OTHER): Payer: 59 | Attending: Otolaryngology | Admitting: Radiology

## 2012-06-05 VITALS — Ht 71.0 in | Wt 295.0 lb

## 2012-06-05 DIAGNOSIS — G4733 Obstructive sleep apnea (adult) (pediatric): Secondary | ICD-10-CM | POA: Insufficient documentation

## 2012-06-08 DIAGNOSIS — G4733 Obstructive sleep apnea (adult) (pediatric): Secondary | ICD-10-CM

## 2012-06-09 NOTE — Procedures (Signed)
NAMEJULIANO, HEPBURN                ACCOUNT NO.:  [id number]  MEDICAL RECORD NO.:  [id number]          PATIENT TYPE:  OUT  LOCATION:  SLEEP CENTER                 FACILITY:  [facility]  PHYSICIAN:  Clinton D. Maple Hudson, MD, FCCP, FACPDATE OF BIRTH:  1963/09/12  DATE OF STUDY:  06/05/2012                           NOCTURNAL POLYSOMNOGRAM  REFERRING PHYSICIAN:  Antony Contras, MD  INDICATION FOR STUDY:  Hypersomnia with sleep apnea.  EPWORTH SLEEPINESS SCORE:  2/24.  BMI 41.1, weight 295 pounds, height 71 inches, neck 19 inches.  MEDICATIONS:  Home medications are charted and reviewed.  SLEEP ARCHITECTURE:  Total sleep time 389 minutes with sleep efficiency 93.4%.  Stage I was 1.4%, stage II 95%, stage III absent.  REM 3.6% of total sleep time.  Sleep latency 26.5 minutes, REM latency 293 minutes. Awake after sleep onset 0.5 minutes.  Arousal index 5.2.  Bedtime medication:  Amitriptyline.  RESPIRATORY DATA:  Apnea-hypopnea index (AHI) 17.3 per hour.  A total of 112 events was scored including 79 obstructive apneas and 33 hypopneas. Events were associated with supine sleep position and REM.  REM AHI 85.7 per hour.  There were insufficient early events to permit application of split protocol of CPAP titration.  OXYGEN DATA:  Moderate-to-occasionally loud snoring with oxygen desaturation to a nadir of 80% and mean oxygen saturation through the study of 93.3% on room air.  CARDIAC DATA:  Normal sinus rhythm.  MOVEMENT-PARASOMNIA:  No movement disturbance.  No bathroom trips.  IMPRESSION-RECOMMENDATION: 1. Sleep pattern shows medication effect with sustained stage II sleep     after amitriptyline. 2. Moderate obstructive sleep apnea/hypopnea syndrome, AHI 17.3 per     hour with supine events.  Moderate to loud snoring with oxygen     desaturation to a nadir of 80% and mean oxygen saturation through     the study of 93.3% on room air.  There were insufficient early     events to permit  split protocol CPAP titration on this study night.     Clinton D. Maple Hudson, MD, [facility], FACP Diplomate, American Board of Sleep Medicine   CDY/MEDQ  D:  06/08/2012 12:20:21  T:  06/09/2012 01:27:39  Job:  [id number]

## 2012-06-11 ENCOUNTER — Other Ambulatory Visit (HOSPITAL_COMMUNITY): Payer: Self-pay | Admitting: Otolaryngology

## 2012-06-24 ENCOUNTER — Encounter (HOSPITAL_COMMUNITY): Payer: Self-pay | Admitting: Pharmacy Technician

## 2012-06-28 ENCOUNTER — Encounter (HOSPITAL_COMMUNITY): Payer: Self-pay

## 2012-06-28 ENCOUNTER — Other Ambulatory Visit: Payer: Self-pay | Admitting: Family

## 2012-06-28 ENCOUNTER — Encounter (HOSPITAL_COMMUNITY)
Admission: RE | Admit: 2012-06-28 | Discharge: 2012-06-28 | Disposition: A | Discharge status: Home / Self Care | Payer: 59 | Source: Ambulatory Visit | Attending: Otolaryngology | Admitting: Otolaryngology

## 2012-06-28 HISTORY — DX: Personal history of other diseases of the digestive system: Z87.19

## 2012-06-28 HISTORY — DX: Mental disorder, not otherwise specified: F99

## 2012-06-28 HISTORY — DX: Sleep apnea, unspecified: G47.30

## 2012-06-28 HISTORY — DX: Urinary calculus, unspecified: N20.9

## 2012-06-28 HISTORY — DX: Gastro-esophageal reflux disease without esophagitis: K21.9

## 2012-06-28 HISTORY — DX: Depression, unspecified: F32.A

## 2012-06-28 HISTORY — DX: Other diseases of salivary glands: K11.8

## 2012-06-28 HISTORY — DX: Major depressive disorder, single episode, unspecified: F32.9

## 2012-06-28 LAB — CBC
Hemoglobin: 14.7 g/dL (ref 13.0–17.0)
MCH: 30.9 pg (ref 26.0–34.0)
MCHC: 32.8 g/dL (ref 30.0–36.0)
Platelets: 276 10*3/uL (ref 150–400)
RDW: 13.6 % (ref 11.5–15.5)

## 2012-06-28 LAB — BASIC METABOLIC PANEL
BUN: 19 mg/dL (ref 6–23)
Calcium: 9.6 mg/dL (ref 8.4–10.5)
GFR calc Af Amer: 84 mL/min — ABNORMAL LOW (ref >90)
GFR calc non Af Amer: 72 mL/min — ABNORMAL LOW (ref >90)
Glucose, Bld: 140 mg/dL — ABNORMAL HIGH (ref 70–99)
Potassium: 5.4 mEq/L — ABNORMAL HIGH (ref 3.5–5.1)

## 2012-06-28 LAB — SURGICAL PCR SCREEN: MRSA, PCR: NEGATIVE

## 2012-06-28 NOTE — Progress Notes (Signed)
New dx of sleep apnea. cpap for 2 weeks. Study in epic

## 2012-06-28 NOTE — Pre-Procedure Instructions (Addendum)
20 DANLEY ALARCON  06/28/2012   Your procedure is scheduled on:  07/04/12  Report to [facility] at 700 AM.  Call this number if you have problems the morning of surgery: [phone]   Remember:   Do not eat food or drink:After Midnight.    Take these medicines the morning of surgery with A SIP OF WATER: elavil, buspar, kionapin, prilosec,,STOP advil, ibuprofen   Do not wear jewelry, .  Do not wear lotions, powders, or perfumes. .  Do not shave 48 hours prior to surgery. Men may shave face and neck.  Do not bring valuables to the hospital.  Contacts, dentures or bridgework may not be worn into surgery.  Leave suitcase in the car. After surgery it may be brought to your room.  For patients admitted to the hospital, checkout time is 11:00 AM the day of discharge.   Patients discharged the day of surgery will not be allowed to drive home.  Name and phone number of your driver: mom delores  [phone]  Special Instructions: Shower using CHG 2 nights before surgery and the night before surgery.  If you shower the day of surgery use CHG.  Use special wash - you have one bottle of CHG for all showers.  You should use approximately 1/3 of the bottle for each shower.   Please read over the following fact sheets that you were given: Pain Booklet, Coughing and Deep Breathing, MRSA Information and Surgical Site Infection Prevention

## 2012-07-01 NOTE — Consult Note (Signed)
Anesthesia Chart Review:  Patient is a 46 year old male scheduled for parotidectomy by Dr. Jenne Pane on 07/04/12.  History includes newly diagnosed moderate OSA (sleep study on 06/05/12) with CPAP use, morbid obesity, former smoker, GERD, hiatal hernia, DM2, depression, marijuana use.  Labs noted.  K 5.4.  Cr 1.17.  Glucose 140.  CBC WNL.  CT of the neck on 05/24/12 showed: 1. 4.5 cm homogeneously enhancing right parotid mass. This likely represents a benign tumor given the characteristics and size. A benign mixed tumor is favored. A Warthin's tumors also considered. Malignancy is less likely.  2. Asymmetric right-sided right submandibular and level II lymph nodes. These increase the possibility of malignancy although the nodes maintaining an ovoid shaped.   EKG on 06/28/12 showed NSR.  He will be evaluated by his anesthesiologist on the day of surgery to discuss the definitive anesthesia plan.  Anticipate he can proceed as planned.  Shonna Chock, PA-C

## 2012-07-04 ENCOUNTER — Ambulatory Visit (HOSPITAL_COMMUNITY): Payer: 59 | Admitting: Vascular Surgery

## 2012-07-04 ENCOUNTER — Encounter (HOSPITAL_COMMUNITY)
Admission: RE | Disposition: A | Discharge status: Home / Self Care | Payer: Self-pay | Source: Ambulatory Visit | Attending: Otolaryngology

## 2012-07-04 ENCOUNTER — Encounter (HOSPITAL_COMMUNITY): Payer: Self-pay | Admitting: Certified Registered"

## 2012-07-04 ENCOUNTER — Observation Stay (HOSPITAL_COMMUNITY)
Admission: RE | Admit: 2012-07-04 | Discharge: 2012-07-05 | Disposition: A | Discharge status: Home / Self Care | Payer: 59 | Source: Ambulatory Visit | Attending: Otolaryngology | Admitting: Otolaryngology

## 2012-07-04 ENCOUNTER — Encounter (HOSPITAL_COMMUNITY): Payer: Self-pay | Admitting: *Deleted

## 2012-07-04 ENCOUNTER — Encounter (HOSPITAL_COMMUNITY): Payer: Self-pay | Admitting: Vascular Surgery

## 2012-07-04 ENCOUNTER — Ambulatory Visit (HOSPITAL_COMMUNITY): Payer: 59 | Admitting: Certified Registered"

## 2012-07-04 DIAGNOSIS — F3289 Other specified depressive episodes: Secondary | ICD-10-CM | POA: Insufficient documentation

## 2012-07-04 DIAGNOSIS — K219 Gastro-esophageal reflux disease without esophagitis: Secondary | ICD-10-CM | POA: Insufficient documentation

## 2012-07-04 DIAGNOSIS — E119 Type 2 diabetes mellitus without complications: Secondary | ICD-10-CM | POA: Insufficient documentation

## 2012-07-04 DIAGNOSIS — Y838 Other surgical procedures as the cause of abnormal reaction of the patient, or of later complication, without mention of misadventure at the time of the procedure: Secondary | ICD-10-CM | POA: Insufficient documentation

## 2012-07-04 DIAGNOSIS — D490 Neoplasm of unspecified behavior of digestive system: Secondary | ICD-10-CM

## 2012-07-04 DIAGNOSIS — Z01810 Encounter for preprocedural cardiovascular examination: Secondary | ICD-10-CM | POA: Insufficient documentation

## 2012-07-04 DIAGNOSIS — IMO0002 Reserved for concepts with insufficient information to code with codable children: Secondary | ICD-10-CM | POA: Insufficient documentation

## 2012-07-04 DIAGNOSIS — F329 Major depressive disorder, single episode, unspecified: Secondary | ICD-10-CM | POA: Insufficient documentation

## 2012-07-04 DIAGNOSIS — G4730 Sleep apnea, unspecified: Secondary | ICD-10-CM | POA: Insufficient documentation

## 2012-07-04 DIAGNOSIS — D119 Benign neoplasm of major salivary gland, unspecified: Principal | ICD-10-CM | POA: Insufficient documentation

## 2012-07-04 DIAGNOSIS — Z79899 Other long term (current) drug therapy: Secondary | ICD-10-CM | POA: Insufficient documentation

## 2012-07-04 DIAGNOSIS — Z01812 Encounter for preprocedural laboratory examination: Secondary | ICD-10-CM | POA: Insufficient documentation

## 2012-07-04 DIAGNOSIS — Z23 Encounter for immunization: Secondary | ICD-10-CM | POA: Insufficient documentation

## 2012-07-04 DIAGNOSIS — Y9210 Unspecified residential institution as the place of occurrence of the external cause: Secondary | ICD-10-CM | POA: Insufficient documentation

## 2012-07-04 HISTORY — PX: HEMATOMA EVACUATION: SHX5118

## 2012-07-04 HISTORY — PX: PAROTIDECTOMY: SHX2163

## 2012-07-04 LAB — GLUCOSE, CAPILLARY
Glucose-Capillary: 179 mg/dL — ABNORMAL HIGH (ref 70–99)
Glucose-Capillary: 109 mg/dL — ABNORMAL HIGH (ref 70–99)
Glucose-Capillary: 111 mg/dL — ABNORMAL HIGH (ref 70–99)

## 2012-07-04 SURGERY — EVACUATION HEMATOMA
Anesthesia: General | Site: Neck | Laterality: Right | Wound class: Clean

## 2012-07-04 SURGERY — EXCISION, PAROTID GLAND
Anesthesia: General | Site: Neck | Laterality: Right | Wound class: Clean

## 2012-07-04 MED ORDER — HYDROCODONE-ACETAMINOPHEN 5-325 MG PO TABS
1.0000 | ORAL_TABLET | ORAL | Status: DC | PRN
  Administered 2012-07-04 – 2012-07-05 (×2): 2 via ORAL
  Filled 2012-07-04 (×2): qty 2

## 2012-07-04 MED ORDER — BACITRACIN ZINC 500 UNIT/GM EX OINT
TOPICAL_OINTMENT | CUTANEOUS | Status: DC | PRN
  Administered 2012-07-04: 1 via TOPICAL

## 2012-07-04 MED ORDER — METFORMIN HCL 500 MG PO TABS
500.0000 mg | ORAL_TABLET | Freq: Every day | ORAL | Status: DC
  Administered 2012-07-05: 500 mg via ORAL
  Filled 2012-07-04 (×2): qty 1

## 2012-07-04 MED ORDER — AMITRIPTYLINE HCL 100 MG PO TABS
100.0000 mg | ORAL_TABLET | Freq: Every day | ORAL | Status: DC
  Administered 2012-07-04 – 2012-07-05 (×2): 100 mg via ORAL
  Filled 2012-07-04 (×2): qty 1

## 2012-07-04 MED ORDER — ONDANSETRON HCL 4 MG/2ML IJ SOLN
4.0000 mg | Freq: Once | INTRAMUSCULAR | Status: AC | PRN
  Administered 2012-07-04: 4 mg via INTRAVENOUS

## 2012-07-04 MED ORDER — 0.9 % SODIUM CHLORIDE (POUR BTL) OPTIME
TOPICAL | Status: DC | PRN
  Administered 2012-07-04: 1000 mL

## 2012-07-04 MED ORDER — ONDANSETRON HCL 4 MG/2ML IJ SOLN: 4.0000 mg | Freq: Once | INTRAMUSCULAR | Status: DC | PRN

## 2012-07-04 MED ORDER — OXYCODONE HCL 5 MG/5ML PO SOLN: 5.0000 mg | Freq: Once | ORAL | Status: DC | PRN

## 2012-07-04 MED ORDER — MORPHINE SULFATE 2 MG/ML IJ SOLN: 2.0000 mg | INTRAMUSCULAR | Status: DC | PRN

## 2012-07-04 MED ORDER — SUCCINYLCHOLINE CHLORIDE 20 MG/ML IJ SOLN
INTRAMUSCULAR | Status: DC | PRN
  Administered 2012-07-04: 140 mg via INTRAVENOUS

## 2012-07-04 MED ORDER — FENTANYL CITRATE 0.05 MG/ML IJ SOLN
INTRAMUSCULAR | Status: DC | PRN
  Administered 2012-07-04: 150 ug via INTRAVENOUS

## 2012-07-04 MED ORDER — ARTIFICIAL TEARS OP OINT
TOPICAL_OINTMENT | Freq: Every day | OPHTHALMIC | Status: DC
  Filled 2012-07-04: qty 3.5

## 2012-07-04 MED ORDER — MEPERIDINE HCL 25 MG/ML IJ SOLN: 6.2500 mg | INTRAMUSCULAR | Status: DC | PRN

## 2012-07-04 MED ORDER — ONDANSETRON HCL 4 MG/2ML IJ SOLN
INTRAMUSCULAR | Status: AC
  Administered 2012-07-04: 4 mg via INTRAVENOUS
  Filled 2012-07-04: qty 2

## 2012-07-04 MED ORDER — LACTATED RINGERS IV SOLN
INTRAVENOUS | Status: DC | PRN
  Administered 2012-07-04 (×2): via INTRAVENOUS

## 2012-07-04 MED ORDER — INSULIN ASPART 100 UNIT/ML ~~LOC~~ SOLN: 0.0000 [IU] | Freq: Every day | SUBCUTANEOUS | Status: DC

## 2012-07-04 MED ORDER — CLONAZEPAM 1 MG PO TABS
2.0000 mg | ORAL_TABLET | Freq: Two times a day (BID) | ORAL | Status: DC
  Administered 2012-07-05: 2 mg via ORAL
  Filled 2012-07-04: qty 2

## 2012-07-04 MED ORDER — LACTATED RINGERS IV SOLN
INTRAVENOUS | Status: DC
  Administered 2012-07-04: 08:00:00 via INTRAVENOUS

## 2012-07-04 MED ORDER — HYDROMORPHONE HCL PF 1 MG/ML IJ SOLN
INTRAMUSCULAR | Status: AC
  Administered 2012-07-04: 0.5 mg via INTRAVENOUS
  Filled 2012-07-04: qty 1

## 2012-07-04 MED ORDER — ONDANSETRON HCL 4 MG/2ML IJ SOLN
INTRAMUSCULAR | Status: DC | PRN
  Administered 2012-07-04: 4 mg via INTRAVENOUS

## 2012-07-04 MED ORDER — PROPOFOL 10 MG/ML IV BOLUS
INTRAVENOUS | Status: DC | PRN
  Administered 2012-07-04: 200 mg via INTRAVENOUS

## 2012-07-04 MED ORDER — LIDOCAINE-EPINEPHRINE 1 %-1:100000 IJ SOLN
INTRAMUSCULAR | Status: AC
  Filled 2012-07-04: qty 1

## 2012-07-04 MED ORDER — LIDOCAINE HCL (CARDIAC) 20 MG/ML IV SOLN
INTRAVENOUS | Status: DC | PRN
  Administered 2012-07-04: 100 mg via INTRAVENOUS

## 2012-07-04 MED ORDER — BACITRACIN ZINC 500 UNIT/GM EX OINT
TOPICAL_OINTMENT | CUTANEOUS | Status: AC
  Filled 2012-07-04: qty 15

## 2012-07-04 MED ORDER — LACTATED RINGERS IV SOLN
INTRAVENOUS | Status: DC | PRN
  Administered 2012-07-04: 13:00:00 via INTRAVENOUS

## 2012-07-04 MED ORDER — CEFAZOLIN SODIUM-DEXTROSE 2-3 GM-% IV SOLR
INTRAVENOUS | Status: AC
  Filled 2012-07-04: qty 50

## 2012-07-04 MED ORDER — BUSPIRONE HCL 15 MG PO TABS
15.0000 mg | ORAL_TABLET | Freq: Every day | ORAL | Status: DC
  Administered 2012-07-05: 15 mg via ORAL
  Filled 2012-07-04 (×2): qty 1

## 2012-07-04 MED ORDER — PANTOPRAZOLE SODIUM 40 MG PO TBEC
40.0000 mg | DELAYED_RELEASE_TABLET | Freq: Every day | ORAL | Status: DC
  Administered 2012-07-05: 40 mg via ORAL
  Filled 2012-07-04: qty 1

## 2012-07-04 MED ORDER — INFLUENZA VIRUS VACC SPLIT PF IM SUSP
0.5000 mL | INTRAMUSCULAR | Status: AC
  Administered 2012-07-05: 0.5 mL via INTRAMUSCULAR
  Filled 2012-07-04: qty 0.5

## 2012-07-04 MED ORDER — BACITRACIN ZINC 500 UNIT/GM EX OINT
1.0000 "application " | TOPICAL_OINTMENT | Freq: Three times a day (TID) | CUTANEOUS | Status: DC
  Administered 2012-07-04 – 2012-07-05 (×3): 1 via TOPICAL
  Filled 2012-07-04: qty 15

## 2012-07-04 MED ORDER — SUCCINYLCHOLINE CHLORIDE 20 MG/ML IJ SOLN
INTRAMUSCULAR | Status: DC | PRN
  Administered 2012-07-04: 120 mg via INTRAVENOUS

## 2012-07-04 MED ORDER — INSULIN ASPART 100 UNIT/ML ~~LOC~~ SOLN
0.0000 [IU] | Freq: Three times a day (TID) | SUBCUTANEOUS | Status: DC
  Administered 2012-07-05: 2 [IU] via SUBCUTANEOUS

## 2012-07-04 MED ORDER — MIDAZOLAM HCL 5 MG/5ML IJ SOLN
INTRAMUSCULAR | Status: DC | PRN
  Administered 2012-07-04: 2 mg via INTRAVENOUS

## 2012-07-04 MED ORDER — CEFAZOLIN SODIUM-DEXTROSE 2-3 GM-% IV SOLR
INTRAVENOUS | Status: DC | PRN
  Administered 2012-07-04: 2 g via INTRAVENOUS

## 2012-07-04 MED ORDER — PROPOFOL 10 MG/ML IV BOLUS
INTRAVENOUS | Status: DC | PRN
  Administered 2012-07-04: 120 mg via INTRAVENOUS
  Administered 2012-07-04: 40 mg via INTRAVENOUS

## 2012-07-04 MED ORDER — SODIUM CHLORIDE 0.45 % IV SOLN
INTRAVENOUS | Status: DC
  Administered 2012-07-04: 18:00:00 via INTRAVENOUS

## 2012-07-04 MED ORDER — POLYVINYL ALCOHOL 1.4 % OP SOLN
1.0000 [drp] | OPHTHALMIC | Status: DC | PRN
  Filled 2012-07-04: qty 15

## 2012-07-04 MED ORDER — HYDROMORPHONE HCL PF 1 MG/ML IJ SOLN: 0.2500 mg | INTRAMUSCULAR | Status: DC | PRN

## 2012-07-04 MED ORDER — FENOFIBRATE 54 MG PO TABS
54.0000 mg | ORAL_TABLET | Freq: Every day | ORAL | Status: DC
  Administered 2012-07-04 – 2012-07-05 (×2): 54 mg via ORAL
  Filled 2012-07-04 (×2): qty 1

## 2012-07-04 MED ORDER — OXYCODONE HCL 5 MG PO TABS: 5.0000 mg | ORAL_TABLET | Freq: Once | ORAL | Status: DC | PRN

## 2012-07-04 MED ORDER — HYDROMORPHONE HCL PF 1 MG/ML IJ SOLN
0.2500 mg | INTRAMUSCULAR | Status: DC | PRN
  Administered 2012-07-04: 0.5 mg via INTRAVENOUS

## 2012-07-04 MED ORDER — FENTANYL CITRATE 0.05 MG/ML IJ SOLN
INTRAMUSCULAR | Status: DC | PRN
  Administered 2012-07-04: 250 ug via INTRAVENOUS
  Administered 2012-07-04 (×3): 50 ug via INTRAVENOUS

## 2012-07-04 MED ORDER — LIDOCAINE-EPINEPHRINE 1 %-1:100000 IJ SOLN
INTRAMUSCULAR | Status: DC | PRN
  Administered 2012-07-04: 5.5 mL

## 2012-07-04 MED ORDER — CEFAZOLIN SODIUM 1-5 GM-% IV SOLN
1.0000 g | Freq: Three times a day (TID) | INTRAVENOUS | Status: AC
  Administered 2012-07-04 – 2012-07-05 (×3): 1 g via INTRAVENOUS
  Filled 2012-07-04 (×4): qty 50

## 2012-07-04 MED ORDER — LIDOCAINE HCL (CARDIAC) 20 MG/ML IV SOLN
INTRAVENOUS | Status: DC | PRN
  Administered 2012-07-04: 75 mg via INTRAVENOUS

## 2012-07-04 MED ORDER — PROMETHAZINE HCL 25 MG PO TABS: 12.5000 mg | ORAL_TABLET | Freq: Four times a day (QID) | ORAL | Status: DC | PRN

## 2012-07-04 MED ORDER — PROPOFOL INFUSION 10 MG/ML OPTIME
INTRAVENOUS | Status: DC | PRN
  Administered 2012-07-04: 100 ug/kg/min via INTRAVENOUS

## 2012-07-04 MED ORDER — PROMETHAZINE HCL 25 MG RE SUPP: 12.5000 mg | Freq: Four times a day (QID) | RECTAL | Status: DC | PRN

## 2012-07-04 MED ORDER — PNEUMOCOCCAL VAC POLYVALENT 25 MCG/0.5ML IJ INJ
0.5000 mL | INJECTION | INTRAMUSCULAR | Status: AC
  Administered 2012-07-05: 0.5 mL via INTRAMUSCULAR
  Filled 2012-07-04: qty 0.5

## 2012-07-04 MED ORDER — ACETAMINOPHEN 80 MG PO CHEW
320.0000 mg | CHEWABLE_TABLET | ORAL | Status: DC | PRN
  Filled 2012-07-04: qty 4

## 2012-07-04 SURGICAL SUPPLY — 41 items
ATTRACTOMAT 16X20 MAGNETIC DRP (DRAPES) ×2 IMPLANT
BLADE SURG ROTATE 9660 (MISCELLANEOUS) IMPLANT
CANISTER SUCTION 2500CC (MISCELLANEOUS) ×2 IMPLANT
CLEANER TIP ELECTROSURG 2X2 (MISCELLANEOUS) ×2 IMPLANT
CLOTH BEACON ORANGE TIMEOUT ST (SAFETY) ×2 IMPLANT
CONT SPEC 4OZ CLIKSEAL STRL BL (MISCELLANEOUS) ×2 IMPLANT
CORDS BIPOLAR (ELECTRODE) ×2 IMPLANT
COVER SURGICAL LIGHT HANDLE (MISCELLANEOUS) ×2 IMPLANT
CRADLE DONUT ADULT HEAD (MISCELLANEOUS) ×2 IMPLANT
DRAIN JACKSON RD 7FR 3/32 (WOUND CARE) ×2 IMPLANT
DRAPE INCISE 13X13 STRL (DRAPES) ×2 IMPLANT
ELECT COATED BLADE 2.86 ST (ELECTRODE) ×2 IMPLANT
ELECT PAIRED SUBDERMAL (MISCELLANEOUS) ×2
ELECT REM PT RETURN 9FT ADLT (ELECTROSURGICAL) ×2
ELECTRODE PAIRED SUBDERMAL (MISCELLANEOUS) ×1 IMPLANT
ELECTRODE REM PT RTRN 9FT ADLT (ELECTROSURGICAL) ×1 IMPLANT
EVACUATOR SILICONE 100CC (DRAIN) ×2 IMPLANT
GAUZE SPONGE 4X4 16PLY XRAY LF (GAUZE/BANDAGES/DRESSINGS) IMPLANT
GLOVE BIO SURGEON STRL SZ 6.5 (GLOVE) ×2 IMPLANT
GLOVE BIO SURGEON STRL SZ7.5 (GLOVE) ×2 IMPLANT
GOWN STRL NON-REIN LRG LVL3 (GOWN DISPOSABLE) ×4 IMPLANT
KIT BASIN OR (CUSTOM PROCEDURE TRAY) ×2 IMPLANT
KIT ROOM TURNOVER OR (KITS) ×2 IMPLANT
NS IRRIG 1000ML POUR BTL (IV SOLUTION) ×4 IMPLANT
PAD ARMBOARD 7.5X6 YLW CONV (MISCELLANEOUS) ×4 IMPLANT
PENCIL BUTTON HOLSTER BLD 10FT (ELECTRODE) ×2 IMPLANT
PROBE NERVBE PRASS .33 (MISCELLANEOUS) ×2 IMPLANT
SPECIMEN JAR MEDIUM (MISCELLANEOUS) ×2 IMPLANT
STAPLER VISISTAT 35W (STAPLE) ×2 IMPLANT
SUT ETHILON 2 0 FS 18 (SUTURE) ×2 IMPLANT
SUT ETHILON 5 0 P 3 18 (SUTURE) ×1
SUT NYLON ETHILON 5-0 P-3 1X18 (SUTURE) ×1 IMPLANT
SUT SILK 2 0 FS (SUTURE) ×4 IMPLANT
SUT VIC AB 3-0 SH 27 (SUTURE) ×2
SUT VIC AB 3-0 SH 27X BRD (SUTURE) ×2 IMPLANT
TOWEL OR 17X24 6PK STRL BLUE (TOWEL DISPOSABLE) ×2 IMPLANT
TOWEL OR 17X26 10 PK STRL BLUE (TOWEL DISPOSABLE) ×2 IMPLANT
TRAY ENT MC OR (CUSTOM PROCEDURE TRAY) ×2 IMPLANT
TRAY FOLEY CATH 14FRSI W/METER (CATHETERS) IMPLANT
UNDERPAD 30X30 INCONTINENT (UNDERPADS AND DIAPERS) IMPLANT
WATER STERILE IRR 1000ML POUR (IV SOLUTION) IMPLANT

## 2012-07-04 SURGICAL SUPPLY — 47 items
ATTRACTOMAT 16X20 MAGNETIC DRP (DRAPES) IMPLANT
BENZOIN TINCTURE PRP APPL 2/3 (GAUZE/BANDAGES/DRESSINGS) ×3 IMPLANT
BLADE SURG ROTATE 9660 (MISCELLANEOUS) IMPLANT
CANISTER SUCTION 2500CC (MISCELLANEOUS) ×3 IMPLANT
CLEANER TIP ELECTROSURG 2X2 (MISCELLANEOUS) ×3 IMPLANT
CLOTH BEACON ORANGE TIMEOUT ST (SAFETY) ×3 IMPLANT
CONT SPEC 4OZ CLIKSEAL STRL BL (MISCELLANEOUS) IMPLANT
CORDS BIPOLAR (ELECTRODE) ×3 IMPLANT
COVER SURGICAL LIGHT HANDLE (MISCELLANEOUS) ×3 IMPLANT
CRADLE DONUT ADULT HEAD (MISCELLANEOUS) ×3 IMPLANT
DRAIN JACKSON RD 7FR 3/32 (WOUND CARE) ×3 IMPLANT
DRAPE INCISE 13X13 STRL (DRAPES) ×3 IMPLANT
ELECT COATED BLADE 2.86 ST (ELECTRODE) ×6 IMPLANT
ELECT PAIRED SUBDERMAL (MISCELLANEOUS)
ELECT REM PT RETURN 9FT ADLT (ELECTROSURGICAL) ×3
ELECTRODE PAIRED SUBDERMAL (MISCELLANEOUS) IMPLANT
ELECTRODE REM PT RTRN 9FT ADLT (ELECTROSURGICAL) ×2 IMPLANT
EVACUATOR SILICONE 100CC (DRAIN) ×6 IMPLANT
GAUZE SPONGE 4X4 16PLY XRAY LF (GAUZE/BANDAGES/DRESSINGS) IMPLANT
GLOVE BIO SURGEON STRL SZ7.5 (GLOVE) ×3 IMPLANT
GLOVE BIOGEL PI IND STRL 6.5 (GLOVE) ×4 IMPLANT
GLOVE BIOGEL PI INDICATOR 6.5 (GLOVE) ×2
GLOVE ECLIPSE 6.5 STRL STRAW (GLOVE) ×3 IMPLANT
GOWN STRL NON-REIN LRG LVL3 (GOWN DISPOSABLE) ×6 IMPLANT
KIT BASIN OR (CUSTOM PROCEDURE TRAY) ×3 IMPLANT
KIT ROOM TURNOVER OR (KITS) ×3 IMPLANT
NS IRRIG 1000ML POUR BTL (IV SOLUTION) ×3 IMPLANT
PAD ARMBOARD 7.5X6 YLW CONV (MISCELLANEOUS) ×6 IMPLANT
PENCIL BUTTON HOLSTER BLD 10FT (ELECTRODE) ×3 IMPLANT
PROBE NERVBE PRASS .33 (MISCELLANEOUS) IMPLANT
SPECIMEN JAR MEDIUM (MISCELLANEOUS) IMPLANT
STAPLER VISISTAT 35W (STAPLE) IMPLANT
SUT ETHILON 2 0 FS 18 (SUTURE) ×6 IMPLANT
SUT ETHILON 5 0 P 3 18 (SUTURE) ×2
SUT NYLON ETHILON 5-0 P-3 1X18 (SUTURE) ×4 IMPLANT
SUT SILK 2 0 FS (SUTURE) ×3 IMPLANT
SUT SILK 2 0 SH (SUTURE) ×3 IMPLANT
SUT SILK 3 0 REEL (SUTURE) ×3 IMPLANT
SUT SILK 4 0 REEL (SUTURE) ×3 IMPLANT
SUT VIC AB 3-0 SH 27 (SUTURE) ×4
SUT VIC AB 3-0 SH 27X BRD (SUTURE) ×8 IMPLANT
TOWEL OR 17X24 6PK STRL BLUE (TOWEL DISPOSABLE) ×3 IMPLANT
TOWEL OR 17X26 10 PK STRL BLUE (TOWEL DISPOSABLE) ×3 IMPLANT
TRAY ENT MC OR (CUSTOM PROCEDURE TRAY) ×3 IMPLANT
TRAY FOLEY CATH 14FRSI W/METER (CATHETERS) IMPLANT
UNDERPAD 30X30 INCONTINENT (UNDERPADS AND DIAPERS) IMPLANT
WATER STERILE IRR 1000ML POUR (IV SOLUTION) ×3 IMPLANT

## 2012-07-04 NOTE — Anesthesia Procedure Notes (Signed)
Procedure Name: Intubation Date/Time: 07/04/2012 1:01 PM Performed by: Jerilee Hoh Pre-anesthesia Checklist: Patient identified, Emergency Drugs available, Suction available and Patient being monitored Patient Re-evaluated:Patient Re-evaluated prior to inductionOxygen Delivery Method: Circle system utilized Preoxygenation: Pre-oxygenation with 100% oxygen Intubation Type: IV induction Ventilation: Mask ventilation with difficulty and Oral airway inserted - appropriate to patient size Laryngoscope Size: Mac and 4 Grade View: Grade III Tube type: Oral Tube size: 7.5 mm Number of attempts: 2 Airway Equipment and Method: Stylet Placement Confirmation: ETT inserted through vocal cords under direct vision,  positive ETCO2 and breath sounds checked- equal and bilateral Secured at: 22 cm Tube secured with: Tape Dental Injury: Teeth and Oropharynx as per pre-operative assessment

## 2012-07-04 NOTE — Preoperative (Signed)
Beta Blockers   Reason not to administer Beta Blockers:Not Applicable 

## 2012-07-04 NOTE — Anesthesia Preprocedure Evaluation (Addendum)
Anesthesia Evaluation  Patient identified by MRN, date of birth, ID band Patient awake    Reviewed: Allergy & Precautions, H&P , NPO status , Patient's Chart, lab work & pertinent test results  History of Anesthesia Complications Negative for: history of anesthetic complications  Airway Mallampati: II TM Distance: >3 FB Neck ROM: Full    Dental  (+) Dental Advisory Given   Pulmonary sleep apnea and Continuous Positive Airway Pressure Ventilation ,          Cardiovascular negative cardio ROS      Neuro/Psych Depression    GI/Hepatic Neg liver ROS, hiatal hernia, GERD-  Medicated and Controlled,  Endo/Other  diabetes, Well Controlled, Oral Hypoglycemic AgentsMorbid obesity  Renal/GU negative Renal ROS  negative genitourinary   Musculoskeletal negative musculoskeletal ROS (+)   Abdominal   Peds  Hematology negative hematology ROS (+)   Anesthesia Other Findings   Reproductive/Obstetrics negative OB ROS                           Anesthesia Physical Anesthesia Plan  ASA: III  Anesthesia Plan: General   Post-op Pain Management:    Induction: Intravenous  Airway Management Planned: Oral ETT  Additional Equipment:   Intra-op Plan:   Post-operative Plan: Extubation in OR  Informed Consent: I have reviewed the patients History and Physical, chart, labs and discussed the procedure including the risks, benefits and alternatives for the proposed anesthesia with the patient or authorized representative who has indicated his/her understanding and acceptance.     Plan Discussed with: CRNA and Surgeon  Anesthesia Plan Comments:         Anesthesia Quick Evaluation

## 2012-07-04 NOTE — H&P (Signed)
Matthew [name] is an 46 y.o. male.   Chief Complaint: right parotid mass HPI: 46 year old with four month history of right parotid mass that has not changed.  No pain.  CT demonstrated round parotid mass.  Recently diagnosed with sleep apnea, on CPAP.  Past Medical History  Diagnosis Date  . Diabetes mellitus   . Mental disorder   . Depression   . Parotid mass     rt  . Sleep apnea     just started on cpap 2 weeks  . Stones in the urinary tract   . GERD (gastroesophageal reflux disease)   . H/O hiatal hernia     ?    History reviewed. No pertinent past surgical history.  Family History  Problem Relation Age of Onset  . Cancer Mother     breast  . Diabetes Father   . Stroke Brother    Social History:  reports that he quit smoking about 3 months ago. His smoking use included Cigarettes. He has a 1.25 pack-year smoking history. He does not have any smokeless tobacco history on file. He reports that he drinks alcohol. He reports that he uses illicit drugs (Marijuana) about once per week.  Allergies: No Known Allergies  Medications Prior to Admission  Medication Sig Dispense Refill  . amitriptyline (ELAVIL) 100 MG tablet Take 100 mg by mouth daily.      . busPIRone (BUSPAR) 15 MG tablet Take 15 mg by mouth daily.      . clonazePAM (KLONOPIN) 2 MG tablet Take 2 mg by mouth 2 (two) times daily. For anxiety      . fenofibrate (TRICOR) 48 MG tablet Take 48 mg by mouth daily.      Marland Kitchen ibuprofen (ADVIL,MOTRIN) 200 MG tablet Take 600 mg by mouth every 6 (six) hours as needed. For pain      . metFORMIN (GLUCOPHAGE) 500 MG tablet Take 500 mg by mouth daily.      Marland Kitchen omeprazole (PRILOSEC) 20 MG capsule Take 20 mg by mouth daily.      . traMADol (ULTRAM) 50 MG tablet TAKE 1 TABLET BY MOUTH EVERY 6 HOURS AS NEEDED FOR PAIN AFTER MEALS  60 tablet  1    Results for orders placed during the hospital encounter of 07/04/12 (from the past 48 hour(s))  GLUCOSE, CAPILLARY     Status: Abnormal   Collection Time   07/04/12  7:18 AM      Component Value Range Comment   Glucose-Capillary 179 (*) 70 - 99 mg/dL    No results found.  Review of Systems  Musculoskeletal: Positive for joint pain (left foot).  All other systems reviewed and are negative.    Blood pressure 139/82, pulse 84, temperature 97.6 F (36.4 C), temperature source Oral, resp. rate 20, SpO2 96.00%. Physical Exam  Constitutional: He is oriented to person, place, and time. He appears well-developed and well-nourished. No distress.  HENT:  Head: Normocephalic and atraumatic.  Right Ear: External ear normal.  Left Ear: External ear normal.  Nose: Nose normal.  Mouth/Throat: Oropharynx is clear and moist.  Eyes: Conjunctivae normal and EOM are normal. Pupils are equal, round, and reactive to light.  Neck: Normal range of motion. Neck supple.       Right parotid mass with 3 cm firm, round mass.  Cardiovascular: Normal rate.   Respiratory: Effort normal.  GI:       Did not examine.  Genitourinary:       Did  not examine.  Musculoskeletal: Normal range of motion.  Neurological: He is alert and oriented to person, place, and time. No cranial nerve deficit.  Skin: Skin is warm and dry.  Psychiatric: He has a normal mood and affect. His behavior is normal. Judgment and thought content normal.     Assessment/Plan Right parotid neoplasm To OR for right parotidectomy.  Observe overnight.  Matthew [name] 07/04/2012, 9:06 AM

## 2012-07-04 NOTE — Brief Op Note (Signed)
07/04/2012  11:56 AM  PATIENT:  Matthew [name]  46 y.o. male  PRE-OPERATIVE DIAGNOSIS:  right parotid mass  POST-OPERATIVE DIAGNOSIS:  right parotid mass  PROCEDURE:  Procedure(s) (LRB) with comments: PAROTIDECTOMY (Right) - right parotidectomy  SURGEON:  Surgeon(s) and Role:    * Christia Reading, MD - Primary  PHYSICIAN ASSISTANT: Clovis Cao  ASSISTANTS: as above  ANESTHESIA:   general  EBL:  Total I/O In: 1200 [I.V.:1200] Out: -   BLOOD ADMINISTERED:none  DRAINS: (7 Fr) Jackson-Pratt drain(s) with closed bulb suction in the right parotid   LOCAL MEDICATIONS USED:  LIDOCAINE   SPECIMEN:  Source of Specimen:  Right parotid gland  DISPOSITION OF SPECIMEN:  PATHOLOGY  COUNTS:  YES  TOURNIQUET:  * No tourniquets in log *  DICTATION: .Other Dictation: Dictation Number [id number]  PLAN OF CARE: Admit for overnight observation  PATIENT DISPOSITION:  PACU - hemodynamically stable.   Delay start of Pharmacological VTE agent (>24hrs) due to surgical blood loss or risk of bleeding: no

## 2012-07-04 NOTE — Brief Op Note (Signed)
07/04/2012  1:32 PM  PATIENT:  Matthew [name]  46 y.o. male  PRE-OPERATIVE DIAGNOSIS:  hematoma right neck  POST-OPERATIVE DIAGNOSIS:  hematoma right neck  PROCEDURE:  Procedure(s) (LRB) with comments: EVACUATION HEMATOMA (Right)  SURGEON:  Surgeon(s) and Role:    * Christia Reading, MD - Primary  PHYSICIAN ASSISTANT:   ASSISTANTS: none   ANESTHESIA:   general  EBL:  Total I/O In: 1750 [I.V.:1750] Out: -   BLOOD ADMINISTERED:none  DRAINS: (7 Fr) Jackson-Pratt drain(s) with closed bulb suction in the right parotid   LOCAL MEDICATIONS USED:  NONE  SPECIMEN:  No Specimen  DISPOSITION OF SPECIMEN:  N/A  COUNTS:  YES  TOURNIQUET:  * No tourniquets in log *  DICTATION: .Other Dictation: Dictation Number [id number]  PLAN OF CARE: Admit for overnight observation  PATIENT DISPOSITION:  PACU - hemodynamically stable.   Delay start of Pharmacological VTE agent (>24hrs) due to surgical blood loss or risk of bleeding: no

## 2012-07-04 NOTE — Anesthesia Postprocedure Evaluation (Signed)
Anesthesia Post Note  Patient: Matthew [name]  Procedure(s) Performed: Procedure(s) (LRB): EVACUATION HEMATOMA (Right)  Anesthesia type: general  Patient location: PACU  Post pain: Pain level controlled  Post assessment: Patient's Cardiovascular Status Stable  Last Vitals:  Filed Vitals:   07/04/12 1415  BP:   Pulse: 89  Temp:   Resp: 17    Post vital signs: Reviewed and stable  Level of consciousness: sedated  Complications: No apparent anesthesia complications

## 2012-07-04 NOTE — Transfer of Care (Signed)
Immediate Anesthesia Transfer of Care Note  Patient: Matthew [name]  Procedure(s) Performed: Procedure(s) (LRB) with comments: EVACUATION HEMATOMA (Right)  Patient Location: PACU  Anesthesia Type:General  Level of Consciousness: awake, alert , oriented and patient cooperative  Airway & Oxygen Therapy: Patient Spontanous Breathing and Patient connected to nasal cannula oxygen  Post-op Assessment: Report given to PACU RN, Post -op Vital signs reviewed and stable and Patient moving all extremities  Post vital signs: Reviewed and stable  Complications: No apparent anesthesia complications

## 2012-07-04 NOTE — Anesthesia Postprocedure Evaluation (Signed)
Anesthesia Post Note  Patient: Matthew [name]  Procedure(s) Performed: Procedure(s) (LRB): PAROTIDECTOMY (Right)  Anesthesia type: general  Patient location: PACU  Post pain: Pain level controlled  Post assessment: Patient's Cardiovascular Status Stable  Last Vitals:  Filed Vitals:   07/04/12 1415  BP:   Pulse: 89  Temp:   Resp: 17    Post vital signs: Reviewed and stable  Level of consciousness: sedated  Complications: No apparent anesthesia complications Surgical Sub Q bleeding to OR

## 2012-07-04 NOTE — Anesthesia Procedure Notes (Signed)
Procedure Name: Intubation Date/Time: 07/04/2012 9:40 AM Performed by: Gwenyth Allegra Pre-anesthesia Checklist: Patient identified, Timeout performed, Emergency Drugs available, Suction available and Patient being monitored Patient Re-evaluated:Patient Re-evaluated prior to inductionOxygen Delivery Method: Circle system utilized Preoxygenation: Pre-oxygenation with 100% oxygen Intubation Type: IV induction, Rapid sequence and Cricoid Pressure applied Laryngoscope Size: Mac and 4 Grade View: Grade III Tube type: Oral Tube size: 8.0 mm Number of attempts: 2 Airway Equipment and Method: Stylet Placement Confirmation: ETT inserted through vocal cords under direct vision,  breath sounds checked- equal and bilateral and positive ETCO2 Secured at: 23 cm Dental Injury: Teeth and Oropharynx as per pre-operative assessment

## 2012-07-04 NOTE — Anesthesia Preprocedure Evaluation (Signed)
Anesthesia Evaluation  Patient identified by MRN, date of birth, ID band Patient awake    Reviewed: Allergy & Precautions, H&P , NPO status   Airway Mallampati: I TM Distance: >3 FB Neck ROM: Full    Dental   Pulmonary sleep apnea ,          Cardiovascular     Neuro/Psych    GI/Hepatic GERD-  Medicated and Controlled,  Endo/Other  diabetes  Renal/GU      Musculoskeletal   Abdominal   Peds  Hematology   Anesthesia Other Findings   Reproductive/Obstetrics                           Anesthesia Physical Anesthesia Plan  ASA: III and emergent  Anesthesia Plan: General   Post-op Pain Management:    Induction: Intravenous, Rapid sequence and Cricoid pressure planned  Airway Management Planned: Oral ETT  Additional Equipment:   Intra-op Plan:   Post-operative Plan: Extubation in OR  Informed Consent: I have reviewed the patients History and Physical, chart, labs and discussed the procedure including the risks, benefits and alternatives for the proposed anesthesia with the patient or authorized representative who has indicated his/her understanding and acceptance.     Plan Discussed with: CRNA and Surgeon  Anesthesia Plan Comments:         Anesthesia Quick Evaluation

## 2012-07-04 NOTE — Transfer of Care (Signed)
Immediate Anesthesia Transfer of Care Note  Patient: Matthew [name]  Procedure(s) Performed: Procedure(s) (LRB) with comments: PAROTIDECTOMY (Right) - right parotidectomy  Patient Location: PACU  Anesthesia Type:General  Level of Consciousness: awake, alert  and oriented  Airway & Oxygen Therapy: Patient Spontanous Breathing and Patient connected to nasal cannula oxygen  Post-op Assessment: Report given to PACU RN and Post -op Vital signs reviewed and stable  Post vital signs: Reviewed and stable  Complications: No apparent anesthesia complications

## 2012-07-04 NOTE — Progress Notes (Signed)
Subjective: S/p right parotidectomy and subsequent drainage of right neck hematoma. Doing well.  Objective: Vital signs in last 24 hours: Temp:  [97.3 F (36.3 C)-98.6 F (37 C)] 97.8 F (36.6 C) (11/21 1600) Pulse Rate:  [75-101] 87  (11/21 1600) Resp:  [15-23] 18  (11/21 1600) BP: (115-154)/(66-82) 146/82 mmHg (11/21 1600) SpO2:  [93 %-100 %] 97 % (11/21 1600) Weight:  [134.9 kg (297 lb 6.4 oz)] 134.9 kg (297 lb 6.4 oz) (11/21 1600)  Facial nerve is House Brackmann 1/6 on left, 1 to 2/6 on right with full eye closure bilaterally with maybe some minimal/mild right upper division weakness but this is subtle. Neck supple with expected post-parotidectomy concavity and incision C/D/I with drain holding suction and no hematoma.  @LABLAST2 (wbc:2,hgb:2,hct:2,plt:2) No results found for this basename: NA:2,K:2,CL:2,CO2:2,GLUCOSE:2,BUN:2,CREATININE:2,CALCIUM:2 in the last 72 hours  Medications:  Scheduled Meds:   . amitriptyline  100 mg Oral Daily  . artificial tears   Both Eyes QHS  . bacitracin  1 application Topical Q8H  . busPIRone  15 mg Oral Daily  .  ceFAZolin (ANCEF) IV  1 g Intravenous Q8H  . clonazePAM  2 mg Oral BID  . fenofibrate  54 mg Oral Daily  . influenza  inactive virus vaccine  0.5 mL Intramuscular Tomorrow-1000  . insulin aspart  0-15 Units Subcutaneous TID WC  . insulin aspart  0-5 Units Subcutaneous QHS  . metFORMIN  500 mg Oral Daily  . pantoprazole  40 mg Oral Daily  . pneumococcal 23 valent vaccine  0.5 mL Intramuscular Tomorrow-1000   Continuous Infusions:   . sodium chloride 75 mL/hr at 07/04/12 1738  . [DISCONTINUED] lactated ringers 50 mL/hr at 07/04/12 0829   PRN Meds:.acetaminophen, HYDROcodone-acetaminophen, morphine injection, [COMPLETED] ondansetron (ZOFRAN) IV, polyvinyl alcohol, promethazine, promethazine, [DISCONTINUED] 0.9 % irrigation (POUR BTL), [DISCONTINUED] 0.9 % irrigation (POUR BTL), [DISCONTINUED] bacitracin, [DISCONTINUED] bacitracin,  [DISCONTINUED]  HYDROmorphone (DILAUDID) injection, [DISCONTINUED]  HYDROmorphone (DILAUDID) injection, [DISCONTINUED] lidocaine-EPINEPHrine [DISCONTINUED] meperidine (DEMEROL) injection, [DISCONTINUED] meperidine (DEMEROL) injection, [DISCONTINUED] ondansetron (ZOFRAN) IV, [DISCONTINUED] oxyCODONE, [DISCONTINUED] oxyCODONE, [DISCONTINUED] oxyCODONE, [DISCONTINUED] oxyCODONE  Assessment/Plan: Doing well s/p right parotidectomy followed by incision and drainage of right hematoma. Doing well with facial nerve intact in all divisions bilaterally. Monitor drain output, sliding scale insulin, home meds.   LOS: 0 days   Melvenia Beam 07/04/2012, 5:43 PM

## 2012-07-04 NOTE — Progress Notes (Signed)
Pt had nipple ring in left  Breast .. It was removed ... Today in short stay and given to pt's mother ...    Also pt drank  From water fountain upon arrival to short stay .Marland Kitchen He states " only one sip"

## 2012-07-05 ENCOUNTER — Encounter (HOSPITAL_COMMUNITY): Payer: Self-pay | Admitting: Otolaryngology

## 2012-07-05 LAB — GLUCOSE, CAPILLARY: Glucose-Capillary: 124 mg/dL — ABNORMAL HIGH (ref 70–99)

## 2012-07-05 MED ORDER — HYDROCODONE-ACETAMINOPHEN 5-325 MG PO TABS: 1.0000 | ORAL_TABLET | ORAL | Status: DC | PRN

## 2012-07-05 NOTE — Op Note (Signed)
Matthew [name], Matthew [name]                ACCOUNT NO.:  [id number]  MEDICAL RECORD NO.:  [id number]  LOCATION:  6N30C                        FACILITY:  [facility]  PHYSICIAN:  Antony Contras, MD     DATE OF BIRTH:  02/11/1966  DATE OF PROCEDURE:  07/04/2012 DATE OF DISCHARGE:                              OPERATIVE REPORT   PREOPERATIVE DIAGNOSIS:  Right parotid gland neoplasm.  POSTOPERATIVE DIAGNOSIS:  Right parotid gland neoplasm.  PROCEDURE:  Right total parotidectomy.  SURGEON:  Antony Contras, MD  ANESTHESIA:  General endotracheal anesthesia.  COMPLICATIONS:  None.  INDICATION:  The patient is a 46 year old white male who has felt a mass in his right parotid gland for the past 4 months that has not changed over that time.  CT imaging demonstrates a well-circumscribed benign- appearing mass and he presents to the operating room for surgical management.  FINDINGS:  A large round mass was found in the center of the parotid gland that was soft and well circumscribed.  The mass extended down through the full depth of the parotid gland to the maxillary vasculature and pushed the superior division of the facial nerve inferiorly and anteriorly.  DESCRIPTION OF PROCEDURE:  The patient was identified in the holding room and informed consent having been obtained including discussion of risks, benefits, and alternatives, the patient was brought to the operative suite and put on the operating table in supine position. Anesthesia was induced and the patient was intubated by the anesthesia team without difficulty.  The patient was given intravenous antibiotics during the case.  The eyes were taped closed and a shoulder roll was placed.  The parotid incision was marked with a marking pen and injected with 1% lidocaine with 1:100,000 epinephrine.  The right face was prepped and draped in sterile fashion.  An incision was made with a 15- blade scalpel through the skin and extended through the  subcutaneous layer using Bovie electrocautery.  A preparotid flap was then elevated using Bovie electrocautery to the periphery of the gland anteriorly. The earlobe was then also freed up and stay sutures were then placed to hold back the flaps.  Dissection was then performed around the parotid tail down to the sternocleidomastoid muscle and the great auricular nerve was divided in this process.  Dissection continued inferiorly down to the depth of the digastric muscle.  Careful dissection was then performed down the tragal cartilage into the region of the stylomastoid foramen.  Careful dissection was performed in this area with the assistance of the nerve stimulator until the main trunk of the facial nerve was finally identified deep in the dissection.  The main trunk was then dissected in an antegrade fashion dividing the parotid gland from the nerve leaving a small portion of the gland inferiorly and dissecting along the nerve dividing it with bipolar electrocautery and scissors. The nerve wrapped around the edge of the tumor and was carefully dissected.  The superior division was not found initially and careful dissection continued out to the periphery of the inferior division until the tumor was passed.  At this point, dissection was performed superiorly from the main trunk of the facial nerve using nerve  stimulator for guidance dividing the parotid gland in same fashion, and then starting around the superior part of the gland above the tumor. The parotid fascia was divided in this fashion and careful dissection continued to free the tumor somewhat to be able follow the facial nerve better.  At this point, the nerve was additionally dissected where it had to be carefully dissected from the actual tumor.  This yielded a branch that wrapped around the tumor and then had it superiorly, which turned out to represent the superior division.  This was carefully dissected and gland safely  divided off of the nerve.  After the superior division was fully dissected, additional dissection was performed then around the tumor through the parotid gland still using the nerve stimulator for assistance.  At this point, with the tumor freed from surrounding tissues except for deeply, careful dissection was performed then deeply to dissect the nerve from the maxillary vasculature.  A couple of arterial branches and venous branches required ligation. Ultimately, the tumor was then removed with the overlying parotid gland tissue and passed to nursing for pathology.  The wound was then copiously irrigated with saline and carefully examined with no sign of any bleeding.  The main trunk of the facial nerve was then stimulated and the inferior division stimulated very well.  The superior division, which was kept intact and carefully dissected did not stimulate very well probably given the dissection that was required off of the tumor. A 7-French round drain was then placed in the depth of the wound and secured at the anterior extent of the incision using 2-0 nylon sutures and a standard drain stitch.  The flaps were released and subcutaneous layer was closed with 3-0 Vicryl suture in a simple interrupted fashion. The skin was closed with 5-0 nylon in a simple running fashion.  The drain was hooked to suction during wake up.  At this point, the drain was changed to a bulb suction and the patient was cleaned off. Antibiotic ointment was added to the incision and the drain was attached to the right shoulder.  The patient was then returned to Anesthesia for wake up, and was extubated and moved to the recovery room in stable condition.     Antony Contras, MD     DDB/MEDQ  D:  07/04/2012  T:  07/05/2012  Job:  [id number]

## 2012-07-05 NOTE — Progress Notes (Signed)
Discharge instructions reviewed with pt and prescription given.  Pt verbalized understanding and had no questions.  Pt discharged in stable condition via wheelchair with family.  Archana Eckman Lindsay   

## 2012-07-05 NOTE — Op Note (Signed)
Matthew [name], Matthew [name]                ACCOUNT NO.:  [id number]  MEDICAL RECORD NO.:  [id number]  LOCATION:  6N30C                        FACILITY:  [facility]  PHYSICIAN:  Antony Contras, MD     DATE OF BIRTH:  09-Sep-1965  DATE OF PROCEDURE:  07/04/2012 DATE OF DISCHARGE:                              OPERATIVE REPORT   PREOPERATIVE DIAGNOSIS:  Right neck postoperative hematoma.  POSTOPERATIVE DIAGNOSIS:  Right neck postoperative hematoma.  PROCEDURE:  Evacuation of right neck hematoma.  SURGEON:  Antony Contras, MD.  ANESTHESIA:  General endotracheal anesthesia.  COMPLICATIONS:  None.  INDICATION:  The patient is a 46 year old male who underwent total thyroidectomy earlier this morning, which was uneventful.  A drain was placed at the time of surgery.  No bleeding seen at the end of surgery. However, in the recovery room, he was found to have an obvious hematoma of the surgical site.  Thus, he presents back to the operating room for evacuation and control bleeding.  FINDINGS:  A hematoma was found to fill the parotid dissection zone that was cleaned out and washed out.  Two bleeding sites were identified and were ligated.  One was in the deep portion of the dissection near the maxillary vessels and the other was near the inferior facial nerve division more distally.  Couple of other spots were cauterized with bipolar electrocautery.  DESCRIPTION OF PROCEDURE:  The patient was identified in the PACU and after discussing the situation with his mother by telephone, he was moved back to the operating room and placed in the operative suite on supine position.  Anesthesia was induced and the patient was intubated by the anesthesia team without difficulty.  The right neck was prepped and draped in sterile fashion.  This was done after removing the drain. After prep, the sutures were removed with scissors and the flaps opened. Hematoma was then cleaned out with finger dissection as well  as irrigation until all the blood was removed.  There were 2 bleeding sites identified with careful observation including 1 near the maxillary vessels deeply and 1 near the distal inferior facial nerve division. Both of these were ligated with 3-0 silk suture.  A couple of other small bleeding sites were cauterized with bipolar electrocautery.  The patient was given a Valsalva, and no additional bleeding was seen.  A new 7-French drain was placed into the wound and secured to the skin using 2-0 nylon suture in a standard drain stitch.  The subcutaneous layer was closed with 3-0 Vicryl suture in a simple interrupted fashion. The skin was closed with 5-0 nylon in simple running fashion.  The drain was hooked to suction during closure.  At this point, the drain was changed to a bulb suction, and the patient was cleaned off. Bacitracin ointment was added to the incision.  The drain was attached to the right shoulder.  The patient was returned to anesthesia for wake up, was extubated, and moved to the recovery room in stable condition.     Antony Contras, MD     DDB/MEDQ  D:  07/04/2012  T:  07/05/2012  Job:  [id number]

## 2012-07-05 NOTE — Discharge Summary (Signed)
Physician Discharge Summary  Patient ID: Matthew [name] MRN: [id number] DOB/AGE: 1966/07/05 46 y.o.  Admit date: 07/04/2012 Discharge date: 07/05/2012  Admission Diagnoses: Right parotid neoplasm  Discharge Diagnoses: same Active Problems:  * No active hospital problems. *    Discharged Condition: good  Hospital Course: 46 year old male with four month known history of right parotid mass that has not changed in size.  CT demonstrated well-circumscribed mass in right parotid gland.  Presented to OR for excision.  For details, see operative note.  In the PACU, he was found to have an immediate hematoma and had to go back to the OR for evacuation and control of bleeding.  For details, see operative note.  He did fine thereafter with drain in place and was observed overnight.  The upper distribution of the facial nerve was found wrapped around the tumor and had to be carefully dissected from the tumor wall.  Post-operatively, the upper division facial movement was found to be weak and eye care was initiated.  On POD 1, the patient felt good and the drain was removed.  The right upper face remains weak.  He was felt stable for discharge.  Consults: None  Significant Diagnostic Studies: none  Treatments: surgery: Right total parotidectomy, evacuation of right neck hematoma  Discharge Exam: Blood pressure 125/74, pulse 82, temperature 98.9 F (37.2 C), temperature source Tympanic, resp. rate 22, height 5\' 11"  (1.803 m), weight 134.9 kg (297 lb 6.4 oz), SpO2 94.00%. General appearance: alert, cooperative and no distress Neck: Right parotid incision clean and intact.  No fluid collection.  Drain removed.  Right upper face weak with poor eye closure.  Right conjunctiva without redness.  Disposition: 01-Home or Self Care  Discharge Orders    Future Orders Please Complete By Expires   Diet - low sodium heart healthy      Increase activity slowly      Discharge instructions      Comments:   Keep the right eye moisturized by applying artificial tears at least hourly or more often and taping the right eye shut when sleeping.  Call immediately if the right eye becomes red or irritated.  Keep the diet bland, no spicy or sour.  Clean incision twice daily with half strength peroxide and apply antibiotic ointment (Bacitracin or Neosporin).  OK to bathe the incision starting tomorrow, gently pat dry.       Medication List     As of 07/05/2012  8:41 AM    TAKE these medications         amitriptyline 100 MG tablet   Commonly known as: ELAVIL   Take 100 mg by mouth daily.      busPIRone 15 MG tablet   Commonly known as: BUSPAR   Take 15 mg by mouth daily.      clonazePAM 2 MG tablet   Commonly known as: KLONOPIN   Take 2 mg by mouth 2 (two) times daily. For anxiety      fenofibrate 48 MG tablet   Commonly known as: TRICOR   Take 48 mg by mouth daily.      HYDROcodone-acetaminophen 5-325 MG per tablet   Commonly known as: NORCO/VICODIN   Take 1-2 tablets by mouth every 4 (four) hours as needed.      ibuprofen 200 MG tablet   Commonly known as: ADVIL,MOTRIN   Take 600 mg by mouth every 6 (six) hours as needed. For pain      metFORMIN 500 MG  tablet   Commonly known as: GLUCOPHAGE   Take 500 mg by mouth daily.      omeprazole 20 MG capsule   Commonly known as: PRILOSEC   Take 20 mg by mouth daily.      traMADol 50 MG tablet   Commonly known as: ULTRAM   TAKE 1 TABLET BY MOUTH EVERY 6 HOURS AS NEEDED FOR PAIN AFTER MEALS           Follow-up Information    Follow up with Ally Knodel, MD. Schedule an appointment as soon as possible for a visit on 07/10/2012.   Contact information:   [address] [place] [address] [phone]          Signed: Christia Reading 07/05/2012, 8:41 AM

## 2012-07-16 ENCOUNTER — Other Ambulatory Visit: Payer: Self-pay | Admitting: Family

## 2012-07-29 ENCOUNTER — Other Ambulatory Visit: Payer: Self-pay | Admitting: Family

## 2012-10-05 ENCOUNTER — Other Ambulatory Visit: Payer: Self-pay | Admitting: Family

## 2012-12-12 ENCOUNTER — Other Ambulatory Visit: Payer: Self-pay | Admitting: Family

## 2013-01-13 ENCOUNTER — Ambulatory Visit: Payer: 59 | Admitting: Family

## 2013-01-20 ENCOUNTER — Telehealth: Payer: Self-pay | Admitting: Family

## 2013-01-20 NOTE — Telephone Encounter (Signed)
Noted  

## 2013-01-20 NOTE — Telephone Encounter (Signed)
Patient Information:  Caller Name: Delores  Phone: [phone]  Patient: Katie, Moch  Gender: Male  DOB: 02-04-1966  Age: 47 Years  PCP: Ad[facility])  Office Follow Up:  Does the office need to follow up with this patient?: No  Instructions For The Office: N/A  RN Note:  States he has periods of moodiness and "up" as well as depression.  States currently he is sleeping a lot, and gets to work daily, but sleeps otherwise.  Told caller that he is currently depressed.  Mom states he has had history of bipolar disorder.  Per depression protocol, advised appt within 72 hours; declines appt until 01/24/13.  Caller transferred to office to schedule appt.  krs/can  Symptoms  Reason For Call & Symptoms: depression, sleeping a lot.  Is working.  Reviewed Health History In EMR: Yes  Reviewed Medications In EMR: Yes  Reviewed Allergies In EMR: Yes  Reviewed Surgeries / Procedures: Yes  Date of Onset of Symptoms: Unknown  Guideline(s) Used:  Depression  Disposition Per Guideline:   See Within 3 Days in Office  Reason For Disposition Reached:   History of manic-depression (bipolar disorder)  Advice Given:  N/A  Patient Will Follow Care Advice:  YES

## 2013-01-24 ENCOUNTER — Ambulatory Visit (INDEPENDENT_AMBULATORY_CARE_PROVIDER_SITE_OTHER): Payer: 59 | Admitting: Family

## 2013-01-24 ENCOUNTER — Encounter: Payer: Self-pay | Admitting: Family

## 2013-01-24 ENCOUNTER — Telehealth: Payer: Self-pay | Admitting: Family

## 2013-01-24 VITALS — BP 130/80 | HR 77 | Wt 302.0 lb

## 2013-01-24 DIAGNOSIS — E119 Type 2 diabetes mellitus without complications: Secondary | ICD-10-CM

## 2013-01-24 DIAGNOSIS — E669 Obesity, unspecified: Secondary | ICD-10-CM | POA: Insufficient documentation

## 2013-01-24 DIAGNOSIS — K219 Gastro-esophageal reflux disease without esophagitis: Secondary | ICD-10-CM

## 2013-01-24 DIAGNOSIS — F32A Depression, unspecified: Secondary | ICD-10-CM

## 2013-01-24 DIAGNOSIS — F329 Major depressive disorder, single episode, unspecified: Secondary | ICD-10-CM

## 2013-01-24 DIAGNOSIS — F3289 Other specified depressive episodes: Secondary | ICD-10-CM

## 2013-01-24 DIAGNOSIS — F411 Generalized anxiety disorder: Secondary | ICD-10-CM

## 2013-01-24 DIAGNOSIS — E7800 Pure hypercholesterolemia, unspecified: Secondary | ICD-10-CM

## 2013-01-24 DIAGNOSIS — F419 Anxiety disorder, unspecified: Secondary | ICD-10-CM | POA: Insufficient documentation

## 2013-01-24 DIAGNOSIS — E1169 Type 2 diabetes mellitus with other specified complication: Secondary | ICD-10-CM | POA: Insufficient documentation

## 2013-01-24 LAB — COMPREHENSIVE METABOLIC PANEL
ALT: 54 U/L — ABNORMAL HIGH (ref 0–53)
AST: 34 U/L (ref 0–37)
CO2: 30 mEq/L (ref 19–32)
Calcium: 9.7 mg/dL (ref 8.4–10.5)
Chloride: 108 mEq/L (ref 96–112)
Creatinine, Ser: 1 mg/dL (ref 0.4–1.5)
GFR: 81.68 mL/min (ref >60.00)
Sodium: 143 mEq/L (ref 135–145)
Total Protein: 6.2 g/dL (ref 6.0–8.3)

## 2013-01-24 LAB — LIPID PANEL
HDL: 35 mg/dL — ABNORMAL LOW (ref >39.00)
VLDL: 53.4 mg/dL — ABNORMAL HIGH (ref 0.0–40.0)

## 2013-01-24 LAB — TESTOSTERONE: Testosterone: 169.6 ng/dL — ABNORMAL LOW (ref 350.00–890.00)

## 2013-01-24 LAB — HEMOGLOBIN A1C: Hgb A1c MFr Bld: 7.3 % — ABNORMAL HIGH (ref 4.6–6.5)

## 2013-01-24 LAB — LDL CHOLESTEROL, DIRECT: Direct LDL: 76.8 mg/dL

## 2013-01-24 LAB — TSH: TSH: 2.07 u[IU]/mL (ref 0.35–5.50)

## 2013-01-24 MED ORDER — OMEPRAZOLE 20 MG PO CPDR: 20.0000 mg | DELAYED_RELEASE_CAPSULE | Freq: Every day | ORAL | Status: DC

## 2013-01-24 MED ORDER — TRAMADOL HCL 50 MG PO TABS: ORAL_TABLET | ORAL | Status: DC

## 2013-01-24 NOTE — Addendum Note (Signed)
Addended by: Beverely Low on: 01/24/2013 04:03 PM   Modules accepted: Orders

## 2013-01-24 NOTE — Telephone Encounter (Signed)
PT stated that he needed a refill of his omeprazole (PRILOSEC) 20 MG capsule, and traMADol (ULTRAM) 50 MG tablet. He would like them called into [facility] [place]. Please assist.

## 2013-01-24 NOTE — Progress Notes (Signed)
Subjective:    Patient ID: Matthew [name], male    DOB: [birth year], 47 y.o.   MRN: [id number]  HPI  47 year old white male, nonsmoker is in today for recheck of depression, anxiety, obesity, hyperlipidemia, and type 2 diabetes. He has concerns of worsening fatigue that's been ongoing for several months. He also has concerns of erectile dysfunction. Occasionally, he wakes up with an erection in the mornings. He reports eating able to achieve an erection but has difficulty maintaining an erection.  Review of Systems  Constitutional: Positive for fatigue.  HENT: Negative.   Respiratory: Negative.   Cardiovascular: Negative.   Gastrointestinal: Negative.   Endocrine: Negative.  Negative for polydipsia.  Genitourinary:       Erectile dysfunction  Musculoskeletal: Negative.   Skin: Negative.   Hematological: Negative.   Psychiatric/Behavioral: Positive for sleep disturbance and decreased concentration. The patient is nervous/anxious.    Past Medical History  Diagnosis Date  . Diabetes mellitus   . Mental disorder   . Depression   . Parotid mass     rt  . Sleep apnea     just started on cpap 2 weeks  . Stones in the urinary tract   . GERD (gastroesophageal reflux disease)   . H/O hiatal hernia     ?    History   Social History  . Marital Status: Single    Spouse Name: N/A    Number of Children: N/A  . Years of Education: N/A   Occupational History  . Not on file.   Social History Main Topics  . Smoking status: Former Smoker -- 0.25 packs/day for 5 years    Types: Cigarettes    Quit date: 03/28/2012  . Smokeless tobacco: Not on file     Comment: marijuana 06/26/12  occ social drinker  . Alcohol Use: Yes  . Drug Use: 1.00 per week    Special: Marijuana  . Sexually Active: Not on file   Other Topics Concern  . Not on file   Social History Narrative  . No narrative on file    Past Surgical History  Procedure Laterality Date  . Parotidectomy  07/04/2012   Procedure: PAROTIDECTOMY;  Surgeon: Christia Reading, MD;  Location: [facility] OR;  Service: ENT;  Laterality: Right;  right parotidectomy  . Hematoma evacuation  07/04/2012    Procedure: EVACUATION HEMATOMA;  Surgeon: Christia Reading, MD;  Location: [facility] OR;  Service: ENT;  Laterality: Right;    Family History  Problem Relation Age of Onset  . Cancer Mother     breast  . Diabetes Father   . Stroke Brother     No Known Allergies  Current Outpatient Prescriptions on File Prior to Visit  Medication Sig Dispense Refill  . amitriptyline (ELAVIL) 100 MG tablet Take 100 mg by mouth daily.      Marland Kitchen amitriptyline (ELAVIL) 100 MG tablet TAKE 1 TABLET EVERY DAY  30 tablet  5  . busPIRone (BUSPAR) 15 MG tablet Take 15 mg by mouth daily.      . clonazePAM (KLONOPIN) 2 MG tablet TAKE 1 TABLET TWICE A DAY AS NEEDED FOR ANIXIETY  60 tablet  3  . fenofibrate (TRICOR) 48 MG tablet Take 48 mg by mouth daily.      Marland Kitchen HYDROcodone-acetaminophen (NORCO/VICODIN) 5-325 MG per tablet Take 1-2 tablets by mouth every 4 (four) hours as needed.  30 tablet  0  . ibuprofen (ADVIL,MOTRIN) 200 MG tablet Take 600 mg by mouth every 6 (six) hours  as needed. For pain      . metFORMIN (GLUCOPHAGE) 500 MG tablet Take 500 mg by mouth daily.      . metFORMIN (GLUCOPHAGE-XR) 500 MG 24 hr tablet TAKE 1 TABLET BY MOUTH DAILY WITH MEALS  90 tablet  0   No current facility-administered medications on file prior to visit.    BP 130/80  Pulse 77  Wt 302 lb (136.986 kg)  BMI 42.14 kg/m2  SpO2 98%chart    Objective:   Physical Exam  Constitutional: He is oriented to person, place, and time. He appears well-developed and well-nourished.  HENT:  Right Ear: External ear normal.  Left Ear: External ear normal.  Nose: Nose normal.  Mouth/Throat: Oropharynx is clear and moist.  Eyes: Conjunctivae are normal.  Neck: Normal range of motion. Neck supple. No thyromegaly present.  Cardiovascular: Normal rate, regular rhythm and normal heart sounds.    Pulmonary/Chest: Effort normal and breath sounds normal.  Abdominal: Soft. Bowel sounds are normal.  Neurological: He is alert and oriented to person, place, and time.  Skin: Skin is warm and dry.  Psychiatric: He has a normal mood and affect.          Assessment & Plan:  Assessment:  1. Type 2 diabetes 2. Depression 3. Anxiety 4. Hypercholesterolemia 5. Obesity 6. Fatigue  Plan: Lab sent to include A1c, CMP, lipids, testosterone, TSH notify patient pending results. We'll discuss further treatment plan thereafter. Return for complete physical exam as soon as possible. Call the office with any questions or concerns.

## 2013-01-25 ENCOUNTER — Other Ambulatory Visit: Payer: Self-pay | Admitting: Family

## 2013-01-27 ENCOUNTER — Other Ambulatory Visit: Payer: Self-pay | Admitting: Family

## 2013-01-27 MED ORDER — METFORMIN HCL ER (OSM) 1000 MG PO TB24: ORAL_TABLET | ORAL | Status: DC

## 2013-01-27 MED ORDER — TESTOSTERONE CYPIONATE 200 MG/ML IM SOLN: 200.0000 mg | INTRAMUSCULAR | Status: DC

## 2013-02-04 ENCOUNTER — Ambulatory Visit (INDEPENDENT_AMBULATORY_CARE_PROVIDER_SITE_OTHER): Payer: 59

## 2013-02-04 DIAGNOSIS — E291 Testicular hypofunction: Secondary | ICD-10-CM

## 2013-02-04 DIAGNOSIS — R7989 Other specified abnormal findings of blood chemistry: Secondary | ICD-10-CM

## 2013-02-04 MED ORDER — TESTOSTERONE CYPIONATE 200 MG/ML IM SOLN
200.0000 mg | INTRAMUSCULAR | Status: DC
  Administered 2013-02-04: 200 mg via INTRAMUSCULAR

## 2013-02-07 ENCOUNTER — Other Ambulatory Visit: Payer: 59

## 2013-02-21 ENCOUNTER — Ambulatory Visit (INDEPENDENT_AMBULATORY_CARE_PROVIDER_SITE_OTHER): Payer: 59 | Admitting: Family

## 2013-02-21 ENCOUNTER — Telehealth: Payer: Self-pay | Admitting: Family

## 2013-02-21 ENCOUNTER — Other Ambulatory Visit: Payer: 59

## 2013-02-21 ENCOUNTER — Encounter: Payer: Self-pay | Admitting: Family

## 2013-02-21 VITALS — BP 120/80 | HR 77 | Ht 71.0 in | Wt 294.0 lb

## 2013-02-21 DIAGNOSIS — E1159 Type 2 diabetes mellitus with other circulatory complications: Secondary | ICD-10-CM

## 2013-02-21 DIAGNOSIS — R3911 Hesitancy of micturition: Secondary | ICD-10-CM

## 2013-02-21 DIAGNOSIS — Z0000 Encounter for general adult medical examination without abnormal findings: Secondary | ICD-10-CM

## 2013-02-21 DIAGNOSIS — E291 Testicular hypofunction: Secondary | ICD-10-CM | POA: Insufficient documentation

## 2013-02-21 DIAGNOSIS — R7989 Other specified abnormal findings of blood chemistry: Secondary | ICD-10-CM

## 2013-02-21 DIAGNOSIS — K644 Residual hemorrhoidal skin tags: Secondary | ICD-10-CM

## 2013-02-21 MED ORDER — METFORMIN HCL 500 MG PO TABS: 500.0000 mg | ORAL_TABLET | Freq: Two times a day (BID) | ORAL | Status: DC

## 2013-02-21 MED ORDER — TESTOSTERONE CYPIONATE 200 MG/ML IM SOLN
200.0000 mg | INTRAMUSCULAR | Status: DC
  Administered 2013-02-21: 200 mg via INTRAMUSCULAR

## 2013-02-21 MED ORDER — TAMSULOSIN HCL 0.4 MG PO CAPS: 0.4000 mg | ORAL_CAPSULE | Freq: Every day | ORAL | Status: DC

## 2013-02-21 NOTE — Patient Instructions (Addendum)

## 2013-02-21 NOTE — Progress Notes (Signed)
Subjective:    Patient ID: Matthew [name], male    DOB: Jan 16, 1966, 47 y.o.   MRN: [id number]  HPI 47 year old white male, is in for complete physical exam. He has a history of hyperlipidemia, obesity, depression, anxiety, type 2 diabetes, and hypogonadism. He tolerates his medications well. Denies any concerns. Last colonoscopy was in 2013. He is due to go back in 5 years.   Review of Systems  Constitutional: Negative.   HENT: Negative.   Eyes: Negative.   Respiratory: Negative.   Cardiovascular: Negative.   Gastrointestinal: Negative.   Endocrine: Negative.   Genitourinary: Negative.   Musculoskeletal: Negative.   Skin: Negative.   Allergic/Immunologic: Negative.   Neurological: Negative.   Hematological: Negative.   Psychiatric/Behavioral: Negative.    Past Medical History  Diagnosis Date  . Diabetes mellitus   . Mental disorder   . Depression   . Parotid mass     rt  . Sleep apnea     just started on cpap 2 weeks  . Stones in the urinary tract   . GERD (gastroesophageal reflux disease)   . H/O hiatal hernia     ?    History   Social History  . Marital Status: Single    Spouse Name: N/A    Number of Children: N/A  . Years of Education: N/A   Occupational History  . Not on file.   Social History Main Topics  . Smoking status: Former Smoker -- 0.25 packs/day for 5 years    Types: Cigarettes    Quit date: 03/28/2012  . Smokeless tobacco: Not on file     Comment: marijuana 06/26/12  occ social drinker  . Alcohol Use: Yes  . Drug Use: 1.00 per week    Special: Marijuana  . Sexually Active: Not on file   Other Topics Concern  . Not on file   Social History Narrative  . No narrative on file    Past Surgical History  Procedure Laterality Date  . Parotidectomy  07/04/2012    Procedure: PAROTIDECTOMY;  Surgeon: Christia Reading, MD;  Location: [facility] OR;  Service: ENT;  Laterality: Right;  right parotidectomy  . Hematoma evacuation  07/04/2012    Procedure:  EVACUATION HEMATOMA;  Surgeon: Christia Reading, MD;  Location: [facility] OR;  Service: ENT;  Laterality: Right;    Family History  Problem Relation Age of Onset  . Cancer Mother     breast  . Diabetes Father   . Stroke Brother     No Known Allergies  Current Outpatient Prescriptions on File Prior to Visit  Medication Sig Dispense Refill  . amitriptyline (ELAVIL) 100 MG tablet Take 100 mg by mouth daily.      Marland Kitchen amitriptyline (ELAVIL) 100 MG tablet TAKE 1 TABLET EVERY DAY  30 tablet  5  . busPIRone (BUSPAR) 15 MG tablet Take 15 mg by mouth daily.      . clonazePAM (KLONOPIN) 2 MG tablet TAKE 1 TABLET TWICE A DAY AS NEEDED FOR ANIXIETY  60 tablet  3  . fenofibrate (TRICOR) 48 MG tablet Take 48 mg by mouth daily.      Marland Kitchen omeprazole (PRILOSEC) 20 MG capsule Take 1 capsule (20 mg total) by mouth daily.  30 capsule  3  . testosterone cypionate (DEPOTESTOTERONE CYPIONATE) 200 MG/ML injection Inject 1 mL (200 mg total) into the muscle every 14 (fourteen) days.  10 mL  0  . traMADol (ULTRAM) 50 MG tablet TAKE 1 TABLET BY MOUTH EVERY 6  HOURS AS NEEDED FOR PAIN AFTER MEALS  60 tablet  1  . HYDROcodone-acetaminophen (NORCO/VICODIN) 5-325 MG per tablet Take 1-2 tablets by mouth every 4 (four) hours as needed.  30 tablet  0  . ibuprofen (ADVIL,MOTRIN) 200 MG tablet Take 600 mg by mouth every 6 (six) hours as needed. For pain       No current facility-administered medications on file prior to visit.    BP 120/80  Pulse 77  Ht 5\' 11"  (1.803 m)  Wt 294 lb (133.358 kg)  BMI 41.02 kg/m2  SpO2 98%chart    Objective:   Physical Exam  Constitutional: He is oriented to person, place, and time. He appears well-developed and well-nourished.  HENT:  Head: Normocephalic.  Right Ear: External ear normal.  Left Ear: External ear normal.  Nose: Nose normal.  Mouth/Throat: Oropharynx is clear and moist.  Eyes: Conjunctivae are normal. Pupils are equal, round, and reactive to light.  Neck: Normal range of motion.  Neck supple. No thyromegaly present.  Cardiovascular: Normal rate, regular rhythm and normal heart sounds.   Pulmonary/Chest: Effort normal and breath sounds normal.  Abdominal: Soft. Bowel sounds are normal.  Genitourinary: Rectum normal, prostate normal and penis normal. Guaiac negative stool. No penile tenderness.  Musculoskeletal: Normal range of motion.  Neurological: He is alert and oriented to person, place, and time. He has normal reflexes.  Skin: Skin is warm and dry.  Psychiatric: He has a normal mood and affect.          Assessment & Plan:  Assessment:  1. Complete physical exam 2. Obesity 3. Hyperlipidemia 4. Anxiety 5. Hypogonadism 6. Type 2 diabetes  Plan: Continue current medications. Encouraged healthy diet, exercise, weight reduction. We'll follow with patient in 4 months and sooner as needed.

## 2013-02-21 NOTE — Telephone Encounter (Signed)
PT would like to know if he could come in at 8am for his injection on Monday 03/03/13. Please assist.

## 2013-02-21 NOTE — Telephone Encounter (Signed)
Left message to notify pt it is ok for him to come in at 8

## 2013-03-03 ENCOUNTER — Ambulatory Visit (INDEPENDENT_AMBULATORY_CARE_PROVIDER_SITE_OTHER): Payer: 59

## 2013-03-03 DIAGNOSIS — E291 Testicular hypofunction: Secondary | ICD-10-CM

## 2013-03-03 MED ORDER — TESTOSTERONE CYPIONATE 200 MG/ML IM SOLN
200.0000 mg | Freq: Once | INTRAMUSCULAR | Status: AC
  Administered 2013-03-03: 200 mg via INTRAMUSCULAR

## 2013-03-14 ENCOUNTER — Ambulatory Visit (INDEPENDENT_AMBULATORY_CARE_PROVIDER_SITE_OTHER): Payer: 59

## 2013-03-14 DIAGNOSIS — E291 Testicular hypofunction: Secondary | ICD-10-CM

## 2013-03-14 MED ORDER — TESTOSTERONE CYPIONATE 200 MG/ML IM SOLN
200.0000 mg | INTRAMUSCULAR | Status: DC
  Administered 2013-03-14: 200 mg via INTRAMUSCULAR

## 2013-03-14 MED ORDER — "SYRINGE/NEEDLE (DISP) 20G X 1"" 1 ML MISC": 1.0000 mL | Status: DC

## 2013-03-20 ENCOUNTER — Other Ambulatory Visit: Payer: Self-pay | Admitting: Family

## 2013-04-15 ENCOUNTER — Ambulatory Visit: Payer: 59 | Admitting: Family

## 2013-04-15 ENCOUNTER — Ambulatory Visit (INDEPENDENT_AMBULATORY_CARE_PROVIDER_SITE_OTHER): Payer: 59 | Admitting: Family

## 2013-04-15 ENCOUNTER — Encounter: Payer: Self-pay | Admitting: Family

## 2013-04-15 VITALS — BP 100/80 | HR 88 | Wt 276.0 lb

## 2013-04-15 DIAGNOSIS — E291 Testicular hypofunction: Secondary | ICD-10-CM

## 2013-04-15 DIAGNOSIS — W19XXXA Unspecified fall, initial encounter: Secondary | ICD-10-CM

## 2013-04-15 DIAGNOSIS — M533 Sacrococcygeal disorders, not elsewhere classified: Secondary | ICD-10-CM

## 2013-04-15 MED ORDER — HYDROCODONE-ACETAMINOPHEN 5-325 MG PO TABS: 1.0000 | ORAL_TABLET | Freq: Three times a day (TID) | ORAL | Status: DC | PRN

## 2013-04-15 MED ORDER — TESTOSTERONE CYPIONATE 200 MG/ML IM SOLN
200.0000 mg | INTRAMUSCULAR | Status: DC
  Administered 2013-04-15: 200 mg via INTRAMUSCULAR

## 2013-04-15 NOTE — Patient Instructions (Signed)
Tailbone Injury °The tailbone (coccyx) is the small bone at the lower end of the spine. A tailbone injury may involve stretched ligaments, bruising, or a broken bone (fracture). Women are more vulnerable to this injury due to having a wider pelvis. °CAUSES  °This type of injury typically occurs from falling and landing on the tailbone. Repeated strain or friction from actions such as rowing and bicycling may also injure the area. The tailbone can be injured during childbirth. Infections or tumors may also press on the tailbone and cause pain. Sometimes, the cause of injury is unknown. °SYMPTOMS  °· Bruising. °· Pain when sitting. °· Painful bowel movements. °· In women, pain during intercourse. °DIAGNOSIS  °Your caregiver can diagnose a tailbone injury based on your symptoms and a physical exam. X-rays may be taken if a fracture is suspected. Your caregiver may also use an MRI scan imaging test to evaluate your symptoms. °TREATMENT  °Your caregiver may prescribe medicines to help relieve your pain. Most tailbone injuries heal on their own in 4 to 6 weeks. However, if the injury is caused by an infection or tumor, the recovery period may vary. °PREVENTION  °Wear appropriate padding and sports gear when bicycling and rowing. This can help prevent an injury from repeated strain or friction. °HOME CARE INSTRUCTIONS  °· Put ice on the injured area. °· Put ice in a plastic bag. °· Place a towel between your skin and the bag. °· Leave the ice on for 15-20 minutes, every hour while awake for the first 1 to 2 days. °· Sit on a large, rubber or inflated ring or cushion to ease your pain. Lean forward when sitting to help decrease discomfort. °· Avoid sitting for long periods of time. °· Increase your activity as the pain allows. °· Only take over-the-counter or prescription medicines for pain, discomfort, or fever as directed by your caregiver. °· You may use stool softeners if it is painful to have a bowel movement, or as  directed by your caregiver. °· Eat a diet with plenty of fiber to help prevent constipation. °· Keep all follow-up appointments as directed by your caregiver. °SEEK MEDICAL CARE IF:  °· Your pain becomes worse. °· Your bowel movements cause a great deal of discomfort. °· You are unable to have a bowel movement. °· You have a fever. °MAKE SURE YOU: °· Understand these instructions. °· Will watch your condition. °· Will get help right away if you are not doing well or get worse. °Document Released: 07/28/2000 Document Revised: 10/23/2011 Document Reviewed: 02/23/2011 °ExitCare® Patient Information ©2014 [employer]. ° °

## 2013-04-16 ENCOUNTER — Encounter: Payer: Self-pay | Admitting: Family

## 2013-04-16 LAB — TESTOSTERONE: Testosterone: 476.77 ng/dL (ref 350.00–890.00)

## 2013-04-16 NOTE — Progress Notes (Signed)
Subjective:    Patient ID: Matthew [name], male    DOB: [birth year], 47 y.o.   MRN: [id number]  HPI 47 year old male, smoker, is in today after a fall 2 days ago. Reports he has been walking on crutches. He had surgery on his foot. Reports falling backwards while trying to learn to walk without crutches. Pain is a 7/10, worse with sitting. Has been taking pain medication that's not helping.  Patient has a history of hypergonadism. Reports feeling like the testosterone helps but can fill the medication when it wears off for a new dose.   Review of Systems  Constitutional: Negative.   Respiratory: Negative.   Cardiovascular: Negative.   Gastrointestinal: Negative.   Endocrine: Negative.   Musculoskeletal: Positive for back pain.  Skin: Negative.   Allergic/Immunologic: Negative.   Neurological: Negative.   Psychiatric/Behavioral: Negative.    Past Medical History  Diagnosis Date  . Diabetes mellitus   . Mental disorder   . Depression   . Parotid mass     rt  . Sleep apnea     just started on cpap 2 weeks  . Stones in the urinary tract   . GERD (gastroesophageal reflux disease)   . H/O hiatal hernia     ?    History   Social History  . Marital Status: Single    Spouse Name: N/A    Number of Children: N/A  . Years of Education: N/A   Occupational History  . Not on file.   Social History Main Topics  . Smoking status: Former Smoker -- 0.25 packs/day for 5 years    Types: Cigarettes    Quit date: 03/28/2012  . Smokeless tobacco: Not on file     Comment: marijuana 06/26/12  occ social drinker  . Alcohol Use: Yes  . Drug Use: 1.00 per week    Special: Marijuana  . Sexual Activity: Not on file   Other Topics Concern  . Not on file   Social History Narrative  . No narrative on file    Past Surgical History  Procedure Laterality Date  . Parotidectomy  07/04/2012    Procedure: PAROTIDECTOMY;  Surgeon: Christia Reading, MD;  Location: [facility] OR;  Service: ENT;   Laterality: Right;  right parotidectomy  . Hematoma evacuation  07/04/2012    Procedure: EVACUATION HEMATOMA;  Surgeon: Christia Reading, MD;  Location: [facility] OR;  Service: ENT;  Laterality: Right;    Family History  Problem Relation Age of Onset  . Cancer Mother     breast  . Diabetes Father   . Stroke Brother     No Known Allergies  Current Outpatient Prescriptions on File Prior to Visit  Medication Sig Dispense Refill  . amitriptyline (ELAVIL) 100 MG tablet Take 100 mg by mouth daily.      Marland Kitchen amitriptyline (ELAVIL) 100 MG tablet TAKE 1 TABLET EVERY DAY  30 tablet  5  . busPIRone (BUSPAR) 15 MG tablet Take 15 mg by mouth daily.      . clonazePAM (KLONOPIN) 2 MG tablet TAKE 1 TABLET TWICE A DAY AS NEEDED FOR ANIXIETY  60 tablet  3  . fenofibrate (TRICOR) 48 MG tablet Take 48 mg by mouth daily.      Marland Kitchen ibuprofen (ADVIL,MOTRIN) 200 MG tablet Take 600 mg by mouth every 6 (six) hours as needed. For pain      . metFORMIN (GLUCOPHAGE) 500 MG tablet Take 1 tablet (500 mg total) by mouth 2 (two) times  daily with a meal.  60 tablet  3  . omeprazole (PRILOSEC) 20 MG capsule Take 1 capsule (20 mg total) by mouth daily.  30 capsule  3  . Syringe/Needle, Disp, 20G X 1" 1 ML MISC Inject 1 mL into the muscle every 14 (fourteen) days.  100 each  0  . tamsulosin (FLOMAX) 0.4 MG CAPS Take 1 capsule (0.4 mg total) by mouth daily.  30 capsule  3  . testosterone cypionate (DEPOTESTOTERONE CYPIONATE) 200 MG/ML injection Inject 1 mL (200 mg total) into the muscle every 14 (fourteen) days.  10 mL  0  . traMADol (ULTRAM) 50 MG tablet TAKE 1 TABLET BY MOUTH EVERY 6 HOURS AS NEEDED FOR PAIN AFTER MEALS  60 tablet  1   No current facility-administered medications on file prior to visit.    BP 100/80  Pulse 88  Wt 276 lb (125.193 kg)  BMI 38.51 kg/m2chart    Objective:   Physical Exam  Constitutional: He is oriented to person, place, and time. He appears well-developed and well-nourished.  HENT:  Right Ear:  External ear normal.  Left Ear: External ear normal.  Nose: Nose normal.  Mouth/Throat: Oropharynx is clear and moist.  Neck: Normal range of motion. Neck supple.  Cardiovascular: Normal rate, regular rhythm and normal heart sounds.   Pulmonary/Chest: Effort normal and breath sounds normal.  Abdominal: Soft. Bowel sounds are normal.  Musculoskeletal: He exhibits tenderness. He exhibits no edema.  Pain to palpation of the coccyx.   Neurological: He is alert and oriented to person, place, and time. He has normal reflexes. He displays normal reflexes. No cranial nerve deficit. Coordination normal.  Skin: Skin is warm and dry.  Psychiatric: He has a normal mood and affect.    Testosterone injection given today.      Assessment & Plan:  Assessment: 1. Type 2 Diabetes  2. Hypogonadism 3. Coccyx Pain  4. Fall  Plan: Xray of the coccyx, will notify patient pending results. Testosterone level sent. Ice to the coccyx. Recheck as scheduled or sooner as needed.

## 2013-04-27 ENCOUNTER — Other Ambulatory Visit: Payer: Self-pay | Admitting: Family

## 2013-04-28 ENCOUNTER — Other Ambulatory Visit: Payer: Self-pay | Admitting: Family

## 2013-04-28 NOTE — Telephone Encounter (Signed)
Last filled on 04/15/13

## 2013-04-30 ENCOUNTER — Other Ambulatory Visit: Payer: Self-pay

## 2013-04-30 MED ORDER — AMITRIPTYLINE HCL 100 MG PO TABS: ORAL_TABLET | ORAL | Status: DC

## 2013-05-09 ENCOUNTER — Other Ambulatory Visit: Payer: Self-pay | Admitting: Family

## 2013-05-16 ENCOUNTER — Other Ambulatory Visit: Payer: Self-pay | Admitting: Family

## 2013-06-02 ENCOUNTER — Other Ambulatory Visit: Payer: Self-pay

## 2013-06-02 MED ORDER — METFORMIN HCL 500 MG PO TABS: 500.0000 mg | ORAL_TABLET | Freq: Two times a day (BID) | ORAL | Status: DC

## 2013-06-18 ENCOUNTER — Ambulatory Visit (INDEPENDENT_AMBULATORY_CARE_PROVIDER_SITE_OTHER): Payer: 59 | Admitting: Family

## 2013-06-18 ENCOUNTER — Encounter: Payer: Self-pay | Admitting: Family

## 2013-06-18 VITALS — BP 132/80 | HR 83 | Wt 299.0 lb

## 2013-06-18 DIAGNOSIS — IMO0001 Reserved for inherently not codable concepts without codable children: Secondary | ICD-10-CM

## 2013-06-18 DIAGNOSIS — E785 Hyperlipidemia, unspecified: Secondary | ICD-10-CM

## 2013-06-18 DIAGNOSIS — Z23 Encounter for immunization: Secondary | ICD-10-CM

## 2013-06-18 DIAGNOSIS — F411 Generalized anxiety disorder: Secondary | ICD-10-CM

## 2013-06-18 DIAGNOSIS — E1165 Type 2 diabetes mellitus with hyperglycemia: Secondary | ICD-10-CM

## 2013-06-18 DIAGNOSIS — E291 Testicular hypofunction: Secondary | ICD-10-CM

## 2013-06-18 LAB — LIPID PANEL
Cholesterol: 146 mg/dL (ref 0–200)
HDL: 38 mg/dL — ABNORMAL LOW (ref >39.00)
VLDL: 61 mg/dL — ABNORMAL HIGH (ref 0.0–40.0)

## 2013-06-18 LAB — HEPATIC FUNCTION PANEL
AST: 33 U/L (ref 0–37)
Albumin: 4.2 g/dL (ref 3.5–5.2)
Bilirubin, Direct: 0 mg/dL (ref 0.0–0.3)
Total Bilirubin: 0.3 mg/dL (ref 0.3–1.2)

## 2013-06-18 LAB — BASIC METABOLIC PANEL
BUN: 13 mg/dL (ref 6–23)
CO2: 28 mEq/L (ref 19–32)
Calcium: 9.2 mg/dL (ref 8.4–10.5)
Chloride: 102 mEq/L (ref 96–112)
Creatinine, Ser: 1.2 mg/dL (ref 0.4–1.5)
Glucose, Bld: 144 mg/dL — ABNORMAL HIGH (ref 70–99)
Potassium: 5 mEq/L (ref 3.5–5.1)

## 2013-06-18 LAB — CBC WITH DIFFERENTIAL/PLATELET
Basophils Absolute: 0 10*3/uL (ref 0.0–0.1)
Eosinophils Absolute: 0.2 10*3/uL (ref 0.0–0.7)
Eosinophils Relative: 3.2 % (ref 0.0–5.0)
HCT: 47.2 % (ref 39.0–52.0)
Hemoglobin: 15.9 g/dL (ref 13.0–17.0)
Lymphs Abs: 3 10*3/uL (ref 0.7–4.0)
MCHC: 33.8 g/dL (ref 30.0–36.0)
MCV: 91.7 fl (ref 78.0–100.0)
Monocytes Absolute: 0.6 10*3/uL (ref 0.1–1.0)
Monocytes Relative: 8.4 % (ref 3.0–12.0)
Neutro Abs: 3.3 10*3/uL (ref 1.4–7.7)
Platelets: 296 10*3/uL (ref 150.0–400.0)
RDW: 14.8 % — ABNORMAL HIGH (ref 11.5–14.6)
WBC: 7.2 10*3/uL (ref 4.5–10.5)

## 2013-06-18 LAB — TESTOSTERONE: Testosterone: 177.36 ng/dL — ABNORMAL LOW (ref 350.00–890.00)

## 2013-06-18 MED ORDER — TRAMADOL HCL 50 MG PO TABS: ORAL_TABLET | ORAL | Status: DC

## 2013-06-18 MED ORDER — OMEPRAZOLE 20 MG PO CPDR: 20.0000 mg | DELAYED_RELEASE_CAPSULE | Freq: Every day | ORAL | Status: DC

## 2013-06-18 MED ORDER — DOXYCYCLINE HYCLATE 100 MG PO TABS: 100.0000 mg | ORAL_TABLET | Freq: Two times a day (BID) | ORAL | Status: DC

## 2013-06-18 MED ORDER — METFORMIN HCL 500 MG PO TABS: 500.0000 mg | ORAL_TABLET | Freq: Two times a day (BID) | ORAL | Status: DC

## 2013-06-18 MED ORDER — FENOFIBRATE 48 MG PO TABS: 48.0000 mg | ORAL_TABLET | Freq: Every day | ORAL | Status: DC

## 2013-06-18 MED ORDER — CLONAZEPAM 2 MG PO TABS: ORAL_TABLET | ORAL | Status: DC

## 2013-06-18 MED ORDER — AMITRIPTYLINE HCL 100 MG PO TABS: ORAL_TABLET | ORAL | Status: DC

## 2013-06-18 NOTE — Progress Notes (Signed)
Subjective:    Patient ID: Matthew [name], male    DOB: 07-Mar-1966, 47 y.o.   MRN: [id number]  HPI 47 year old white male, is in for recheck of type 2 diabetes, obesity, hyperlipidemia, hypogonadism, and depression. He reports he is doing well. Denies any concerns. Requesting a 90 supply on his medications. He is not currently exercising or following any particular diet    Review of Systems  Constitutional: Negative.   HENT: Negative.   Respiratory: Negative.   Cardiovascular: Negative.   Gastrointestinal: Negative.   Endocrine: Negative.   Genitourinary: Negative.   Skin: Negative.   Neurological: Negative.   Hematological: Negative.   Psychiatric/Behavioral: Negative.    Past Medical History  Diagnosis Date  . Diabetes mellitus   . Mental disorder   . Depression   . Parotid mass     rt  . Sleep apnea     just started on cpap 2 weeks  . Stones in the urinary tract   . GERD (gastroesophageal reflux disease)   . H/O hiatal hernia     ?    History   Social History  . Marital Status: Single    Spouse Name: N/A    Number of Children: N/A  . Years of Education: N/A   Occupational History  . Not on file.   Social History Main Topics  . Smoking status: Former Smoker -- 0.25 packs/day for 5 years    Types: Cigarettes    Quit date: 03/28/2012  . Smokeless tobacco: Not on file     Comment: marijuana 06/26/12  occ social drinker  . Alcohol Use: Yes  . Drug Use: 1.00 per week    Special: Marijuana  . Sexual Activity: Not on file   Other Topics Concern  . Not on file   Social History Narrative  . No narrative on file    Past Surgical History  Procedure Laterality Date  . Parotidectomy  07/04/2012    Procedure: PAROTIDECTOMY;  Surgeon: Christia Reading, MD;  Location: [facility] OR;  Service: ENT;  Laterality: Right;  right parotidectomy  . Hematoma evacuation  07/04/2012    Procedure: EVACUATION HEMATOMA;  Surgeon: Christia Reading, MD;  Location: [facility] OR;  Service: ENT;   Laterality: Right;    Family History  Problem Relation Age of Onset  . Cancer Mother     breast  . Diabetes Father   . Stroke Brother     No Known Allergies  Current Outpatient Prescriptions on File Prior to Visit  Medication Sig Dispense Refill  . busPIRone (BUSPAR) 15 MG tablet Take 15 mg by mouth daily.      Marland Kitchen HYDROcodone-acetaminophen (NORCO/VICODIN) 5-325 MG per tablet TAKE 1 TABLET BY MOUTH EVERY 8 HOURS AS NEEDED  30 tablet  0  . ibuprofen (ADVIL,MOTRIN) 200 MG tablet Take 600 mg by mouth every 6 (six) hours as needed. For pain      . Syringe/Needle, Disp, 20G X 1" 1 ML MISC Inject 1 mL into the muscle every 14 (fourteen) days.  100 each  0  . tamsulosin (FLOMAX) 0.4 MG CAPS Take 1 capsule (0.4 mg total) by mouth daily.  30 capsule  3  . testosterone cypionate (DEPOTESTOTERONE CYPIONATE) 200 MG/ML injection INJECT 1 ML INTO THE MUSCLE EVERY 14 DAYS  10 mL  0   No current facility-administered medications on file prior to visit.    BP 132/80  Pulse 83  Wt 299 lb (135.626 kg)chart    Objective:   Physical Exam  Constitutional: He is oriented to person, place, and time. He appears well-developed and well-nourished.  HENT:  Right Ear: External ear normal.  Left Ear: External ear normal.  Nose: Nose normal.  Mouth/Throat: Oropharynx is clear and moist.  Neck: Normal range of motion. Neck supple. No thyromegaly present.  Cardiovascular: Normal rate, regular rhythm and normal heart sounds.   Pulmonary/Chest: Effort normal and breath sounds normal.  Abdominal: Soft. Bowel sounds are normal. He exhibits no distension. There is no tenderness.  Musculoskeletal: Normal range of motion. He exhibits no edema and no tenderness.  Neurological: He is alert and oriented to person, place, and time.  Skin: Skin is warm and dry.  Psychiatric: He has a normal mood and affect.          Assessment & Plan:  Assessment:  1. Type 2 diabetes-uncontrolled 2. Anxiety 3.  Hyperlipidemia 4. Hypogonadism  Plan: Lab sent to include A1c, lipids, CBC, BMP, and LFTs will notify patient of results. Encouraged healthy diet, exercise, weight reduction. We'll follow with patient and and labs, 4 months come in sooner if needed.

## 2013-08-24 IMAGING — CT CT ABD-PELV W/O CM
1 series · 15 of 27 positions shown, 19 images · non-contrast
Comparison: CT of abdomen and pelvis 11/03/2009.

CLINICAL DATA: Left-sided flank pain.

CT ABDOMEN AND PELVIS WITHOUT CONTRAST
TECHNIQUE: Multidetector CT imaging of the abdomen and pelvis was
performed following the standard protocol without intravenous
contrast.

[Series 4: lung · axial · 0.74mm/px · z∈[+1335,+1455]mm · 15 of 27 slices shown, 19 images]
[im 2/27  soft-tissue]
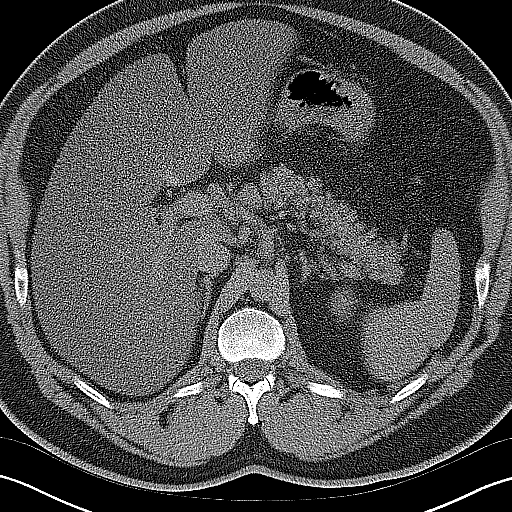
[im 2/27  bone]
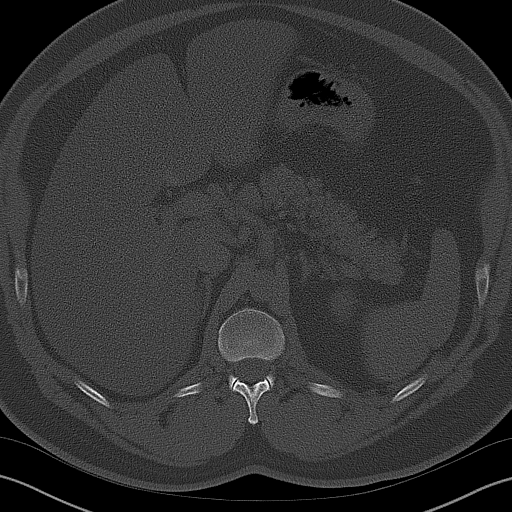
[im 4/27  soft-tissue]
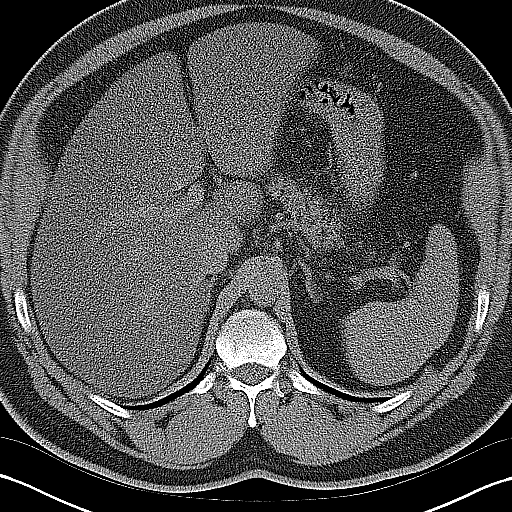
[im 6/27  soft-tissue]
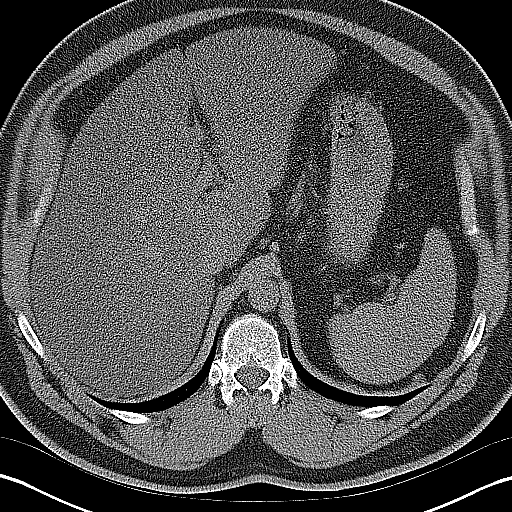
[im 8/27  soft-tissue]
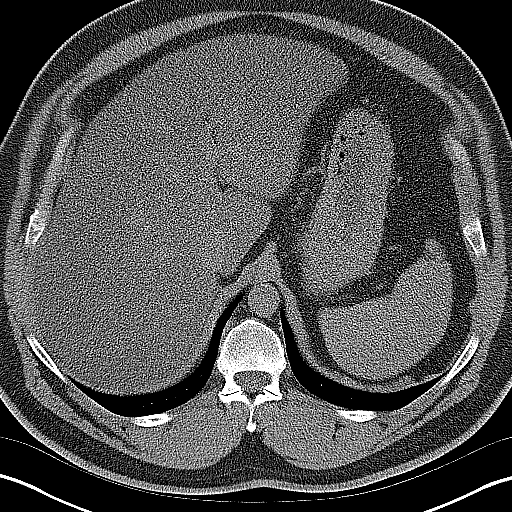
[im 10/27  soft-tissue]
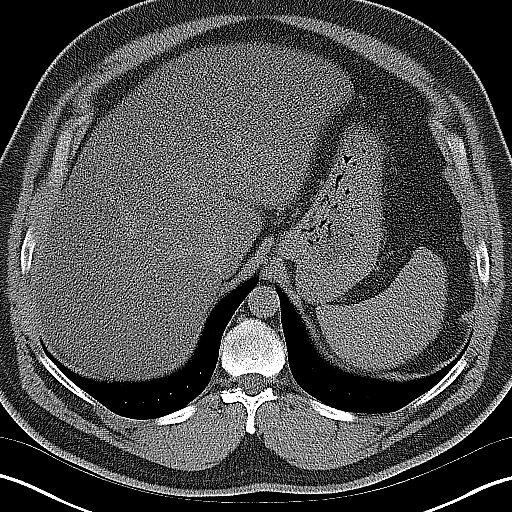
[im 12/27  soft-tissue]
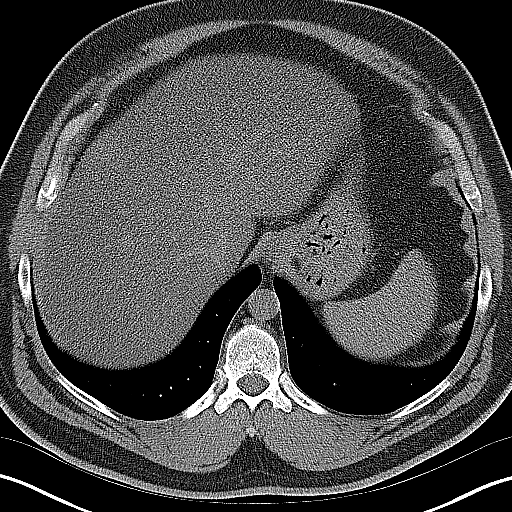
[im 14/27  soft-tissue]
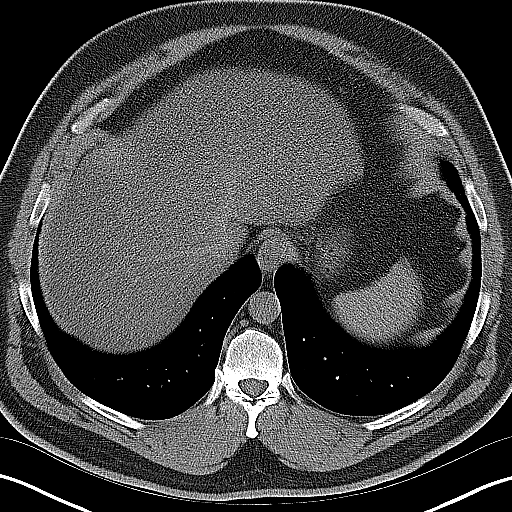
[im 16/27  soft-tissue]
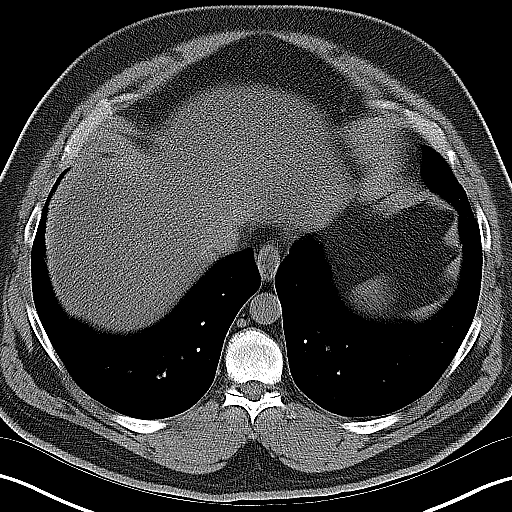
[im 18/27  soft-tissue]
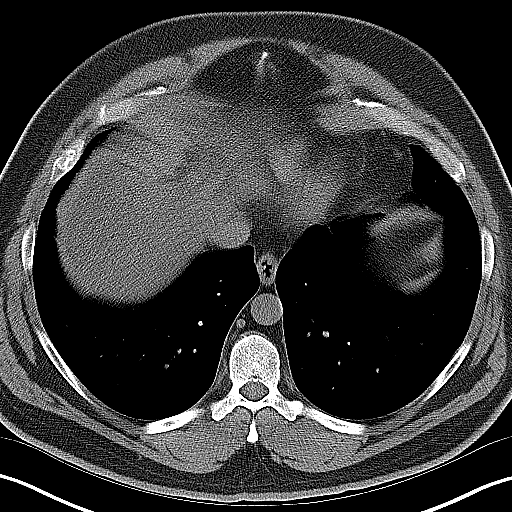
[im 18/27  bone]
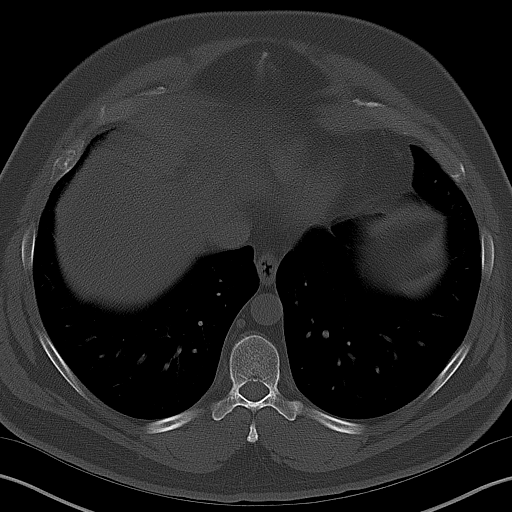
[im 20/27  soft-tissue]
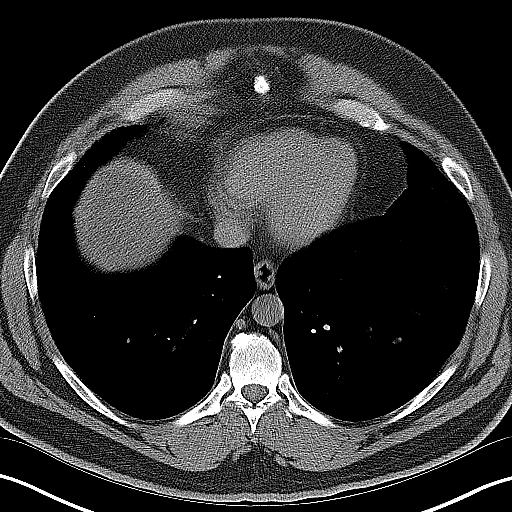
[im 22/27  soft-tissue]
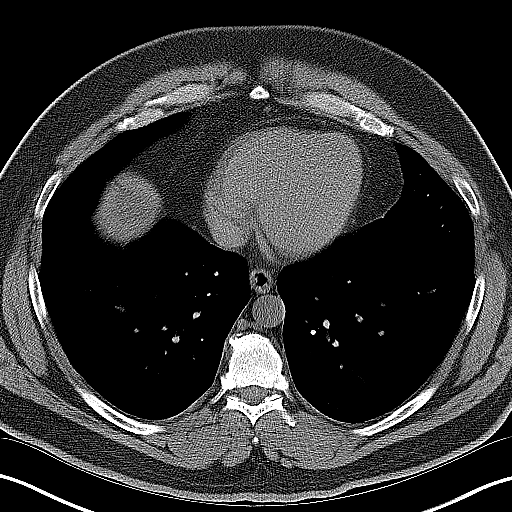
[im 23/27  lung]
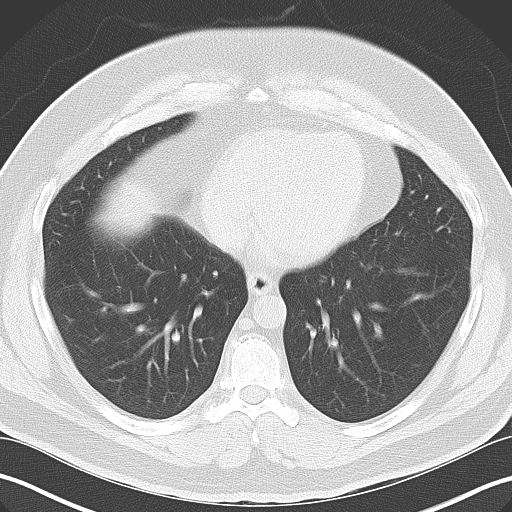
[im 24/27  soft-tissue]
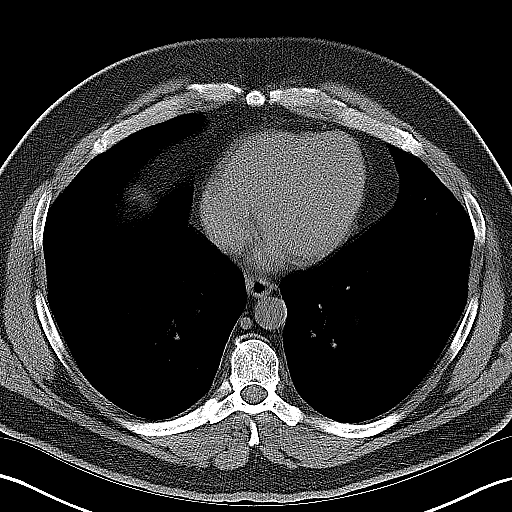
[im 24/27  lung]
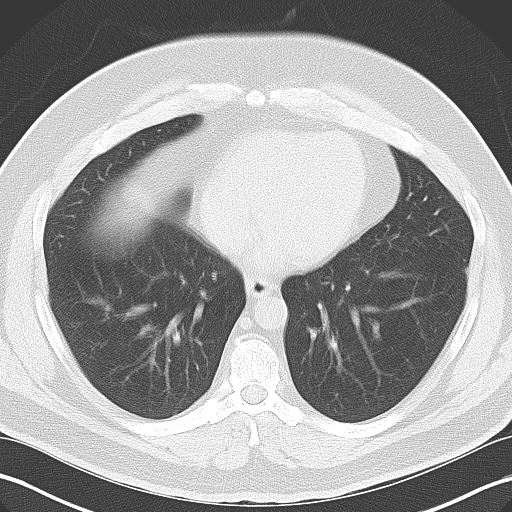
[im 25/27  lung]
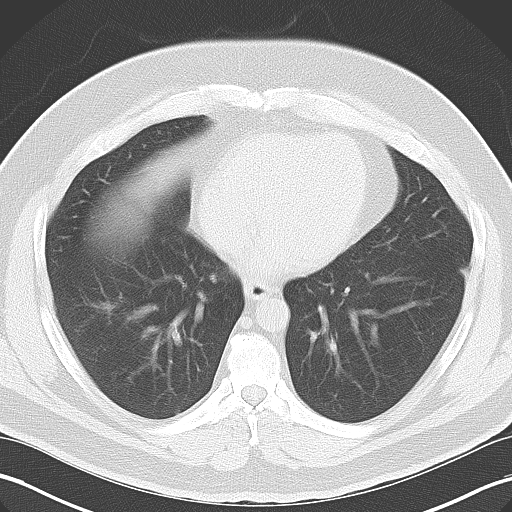
[im 26/27  soft-tissue]
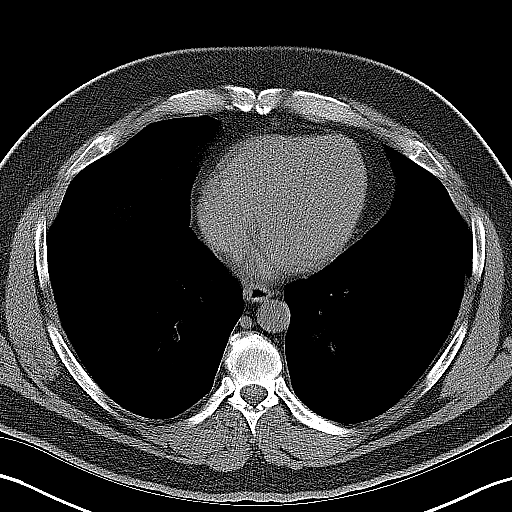
[im 26/27  lung]
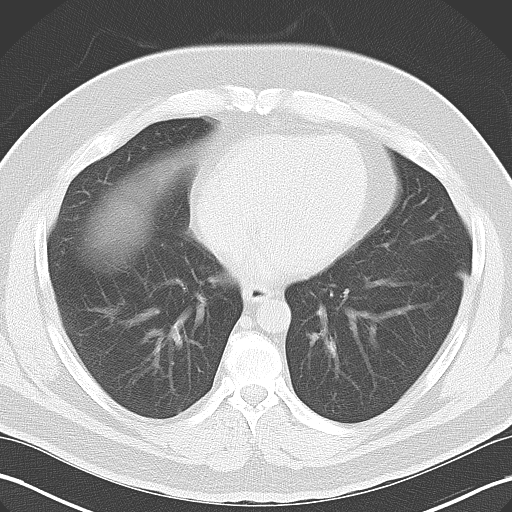

[15 of 27 positions shown; findings below may reference images not displayed]

FINDINGS: Lung Bases: Unremarkable.

Abdomen/Pelvis:  There is a 4 mm nonobstructive calculus in the
lower pole collecting system of the right kidney.  No additional
calculi are noted along the course of either ureter, within the
lumen of the urinary bladder, or within the left renal collecting
system.  No hydroureteronephrosis or perinephric stranding to
suggest urinary tract obstruction at this time.

Diffusely decreased attenuation throughout the hepatic parenchyma
as compatible with hepatic steatosis.  Some focal areas of fatty
sparing are noted adjacent to the gallbladder fossa.  The
unenhanced appearance of the pancreas, spleen and bilateral adrenal
glands is unremarkable.

There is no ascites or pneumoperitoneum and no pathologic
distension of bowel.  No pathologic lymphadenopathy identified
within the abdomen or pelvis.  The appendix is normal.  Prostate
and urinary bladder are unremarkable in appearance.

Musculoskeletal: There are no aggressive appearing lytic or blastic
lesions noted in the visualized portions of the skeleton.
Spondylolisthesis at L5-S1 is noted with 14 mm of anterolisthesis
at this level (similar to prior).
IMPRESSION: 1.  4 mm nonobstructive calculus in the lower pole collecting
system of the right kidney.
2.  No ureteral calculi or findings of urinary tract obstruction at
this time.
3.  Normal appendix.
4.  Spondylolisthesis at L5-S1, as above.

## 2013-09-09 ENCOUNTER — Other Ambulatory Visit: Payer: Self-pay | Admitting: Family

## 2013-12-02 ENCOUNTER — Other Ambulatory Visit: Payer: Self-pay | Admitting: Family

## 2014-01-06 ENCOUNTER — Other Ambulatory Visit: Payer: Self-pay | Admitting: Family

## 2014-01-09 ENCOUNTER — Ambulatory Visit (INDEPENDENT_AMBULATORY_CARE_PROVIDER_SITE_OTHER): Payer: 59 | Admitting: Family

## 2014-01-09 ENCOUNTER — Encounter: Payer: Self-pay | Admitting: Family

## 2014-01-09 VITALS — BP 130/80 | HR 95 | Wt 285.0 lb

## 2014-01-09 DIAGNOSIS — M25561 Pain in right knee: Secondary | ICD-10-CM

## 2014-01-09 DIAGNOSIS — F411 Generalized anxiety disorder: Secondary | ICD-10-CM

## 2014-01-09 DIAGNOSIS — M25569 Pain in unspecified knee: Secondary | ICD-10-CM

## 2014-01-09 DIAGNOSIS — E291 Testicular hypofunction: Secondary | ICD-10-CM

## 2014-01-09 DIAGNOSIS — E119 Type 2 diabetes mellitus without complications: Secondary | ICD-10-CM

## 2014-01-09 DIAGNOSIS — E7800 Pure hypercholesterolemia, unspecified: Secondary | ICD-10-CM

## 2014-01-09 LAB — CBC WITH DIFFERENTIAL/PLATELET
BASOS PCT: 0.3 % (ref 0.0–3.0)
Basophils Absolute: 0 10*3/uL (ref 0.0–0.1)
EOS PCT: 2.9 % (ref 0.0–5.0)
Eosinophils Absolute: 0.4 10*3/uL (ref 0.0–0.7)
HEMATOCRIT: 49.6 % (ref 39.0–52.0)
Hemoglobin: 16.8 g/dL (ref 13.0–17.0)
LYMPHS ABS: 6.1 10*3/uL — AB (ref 0.7–4.0)
Lymphocytes Relative: 46.4 % — ABNORMAL HIGH (ref 12.0–46.0)
MCHC: 33.9 g/dL (ref 30.0–36.0)
MCV: 92.5 fl (ref 78.0–100.0)
MONO ABS: 0.8 10*3/uL (ref 0.1–1.0)
Monocytes Relative: 6.1 % (ref 3.0–12.0)
NEUTROS ABS: 5.8 10*3/uL (ref 1.4–7.7)
Neutrophils Relative %: 44.3 % (ref 43.0–77.0)
Platelets: 316 10*3/uL (ref 150.0–400.0)
RBC: 5.37 Mil/uL (ref 4.22–5.81)
RDW: 14.3 % (ref 11.5–15.5)
WBC: 13.1 10*3/uL — ABNORMAL HIGH (ref 4.0–10.5)

## 2014-01-09 LAB — BASIC METABOLIC PANEL
BUN: 27 mg/dL — ABNORMAL HIGH (ref 6–23)
CO2: 29 mEq/L (ref 19–32)
Calcium: 9.6 mg/dL (ref 8.4–10.5)
Chloride: 102 mEq/L (ref 96–112)
Creatinine, Ser: 1.4 mg/dL (ref 0.4–1.5)
GFR: 55.27 mL/min — AB (ref >60.00)
Glucose, Bld: 123 mg/dL — ABNORMAL HIGH (ref 70–99)
POTASSIUM: 4.1 meq/L (ref 3.5–5.1)
Sodium: 141 mEq/L (ref 135–145)

## 2014-01-09 LAB — HEPATIC FUNCTION PANEL
ALT: 53 U/L (ref 0–53)
AST: 33 U/L (ref 0–37)
Albumin: 4.4 g/dL (ref 3.5–5.2)
Alkaline Phosphatase: 56 U/L (ref 39–117)
BILIRUBIN TOTAL: 0.6 mg/dL (ref 0.2–1.2)
Bilirubin, Direct: 0.1 mg/dL (ref 0.0–0.3)
Total Protein: 7.5 g/dL (ref 6.0–8.3)

## 2014-01-09 LAB — LIPID PANEL
CHOL/HDL RATIO: 5
Cholesterol: 204 mg/dL — ABNORMAL HIGH (ref 0–200)
HDL: 42.2 mg/dL (ref >39.00)
LDL CALC: 81 mg/dL (ref 0–99)
Triglycerides: 402 mg/dL — ABNORMAL HIGH (ref 0.0–149.0)
VLDL: 80.4 mg/dL — AB (ref 0.0–40.0)

## 2014-01-09 LAB — TESTOSTERONE: Testosterone: 92.66 ng/dL — ABNORMAL LOW (ref 300.00–890.00)

## 2014-01-09 LAB — HEMOGLOBIN A1C: HEMOGLOBIN A1C: 6.6 % — AB (ref 4.6–6.5)

## 2014-01-09 MED ORDER — AMITRIPTYLINE HCL 100 MG PO TABS: ORAL_TABLET | ORAL | Status: DC

## 2014-01-09 MED ORDER — CLONAZEPAM 2 MG PO TABS: ORAL_TABLET | ORAL | Status: DC

## 2014-01-09 MED ORDER — TESTOSTERONE CYPIONATE 200 MG/ML IM SOLN: INTRAMUSCULAR | Status: DC

## 2014-01-09 NOTE — Patient Instructions (Signed)
Wear and Tear Disorders of the Knee (Arthritis, Osteoarthritis)  Everyone will experience wear and tear injuries (arthritis, osteoarthritis) of the knee. These are the changes we all get as we age. They come from the joint stress of daily living. The amount of cartilage damage in your knee and your symptoms determine if you need surgery. Mild problems require approximately two months recovery time. More severe problems take several months to recover. With mild problems, your surgeon may find worn and rough cartilage surfaces. With severe changes, your surgeon may find cartilage that has completely worn away and exposed the bone. Loose bodies of bone and cartilage, bone spurs (excess bone growth), and injuries to the menisci (cushions between the large bones of your leg) are also common. All of these problems can cause pain.  For a mild wear and tear problem, rough cartilage may simply need to be shaved and smoothed. For more severe problems with areas of exposed bone, your surgeon may use an instrument for roughing up the bone surfaces to stimulate new cartilage growth. Loose bodies are usually removed. Torn menisci may be trimmed or repaired.  ABOUT THE ARTHROSCOPIC PROCEDURE  Arthroscopy is a surgical technique. It allows your orthopedic surgeon to diagnose and treat your knee injury with accuracy. The surgeon looks into your knee through a small scope. The scope is like a small (pencil-sized) telescope. Arthroscopy is less invasive than open knee surgery. You can expect a more rapid recovery. After the procedure, you will be moved to a recovery area until most of the effects of the medication have worn off. Your caregiver will discuss the test results with you.  RECOVERY  The severity of the arthritis and the type of procedure performed will determine recovery time. Other important factors include age, physical condition, medical conditions, and the type of rehabilitation program. Strengthening your muscles after  arthroscopy helps guarantee a better recovery. Follow your caregiver's instructions. Use crutches, rest, elevate, ice, and do knee exercises as instructed. Your caregivers will help you and instruct you with exercises and other physical therapy required to regain your mobility, muscle strength, and functioning following surgery. Only take over-the-counter or prescription medicines for pain, discomfort, or fever as directed by your caregiver.   SEEK MEDICAL CARE IF:   · There is increased bleeding (more than a small spot) from the wound.  · You notice redness, swelling, or increasing pain in the wound.  · Pus is coming from wound.  · You develop an unexplained oral temperature above 102° F (38.9° C) , or as your caregiver suggests.  · You notice a foul smell coming from the wound or dressing.  · You have severe pain with motion of the knee.  SEEK IMMEDIATE MEDICAL CARE IF:   · You develop a rash.  · You have difficulty breathing.  · You have any allergic problems.  MAKE SURE YOU:   · Understand these instructions.  · Will watch your condition.  · Will get help right away if you are not doing well or get worse.  Document Released: 07/28/2000 Document Revised: 10/23/2011 Document Reviewed: 12/25/2007  ExitCare® Patient Information ©2014 [employer].

## 2014-01-09 NOTE — Progress Notes (Signed)
   Subjective:    Patient ID: Matthew [name], male    DOB: 11-30-1965, 48 y.o.   MRN: [id number]  HPI 48 year old white male, smoker, is in for recheck of type 2 diabetes, obesity, depression, hypercholesterolemia, hypergonadism. Currently stable on all medications. Has concerns of right knee pain that tends to be worse in the morning. Rates pain as 6/10. Has not been taking any medication for relief.   Review of Systems  Constitutional: Negative.   HENT: Negative.   Respiratory: Negative.   Cardiovascular: Negative.   Gastrointestinal: Negative.   Genitourinary: Negative.   Musculoskeletal: Negative.   Skin: Negative.   Neurological: Negative.   Psychiatric/Behavioral: Negative.        Objective:   Physical Exam  Constitutional: He is oriented to person, place, and time. He appears well-developed and well-nourished.  HENT:  Right Ear: External ear normal.  Left Ear: External ear normal.  Nose: Nose normal.  Mouth/Throat: Oropharynx is clear and moist.  Neck: Normal range of motion. Neck supple. No thyromegaly present.  Cardiovascular: Normal rate, regular rhythm and normal heart sounds.   Pulmonary/Chest: Effort normal and breath sounds normal.  Abdominal: Soft. Bowel sounds are normal.  Musculoskeletal: Normal range of motion.  Neurological: He is alert and oriented to person, place, and time.  Skin: Skin is warm and dry.  Psychiatric: He has a normal mood and affect.          Assessment & Plan:  Anis was seen today for follow-up.  Diagnoses and associated orders for this visit:  Type II or unspecified type diabetes mellitus without mention of complication, not stated as uncontrolled - Basic Metabolic Panel - Hepatic Function Panel - Hemoglobin A1c  Anxiety state, unspecified - CBC with Differential  Hypogonadism male - Testosterone  Right knee pain - DG Knee Complete 4 Views Right; Future  Pure hypercholesterolemia - Lipid Panel  Other  Orders - clonazePAM (KLONOPIN) 2 MG tablet; TAKE 1 TABLET TWICE A DAY AS NEEDED FOR ANXIETY - amitriptyline (ELAVIL) 100 MG tablet; TAKE 1 TABLET EVERY DAY - testosterone cypionate (DEPOTESTOTERONE CYPIONATE) 200 MG/ML injection; INJECT 1 ML INTO THE MUSCLE EVERY 14 DAYS    call the office with any questions or concerns. Recheck in 6 months and sooner as needed.

## 2014-01-09 NOTE — Progress Notes (Signed)
Pre visit review using our clinic review tool, if applicable. No additional management support is needed unless otherwise documented below in the visit note. 

## 2014-01-12 ENCOUNTER — Ambulatory Visit: Payer: 59 | Admitting: Family

## 2014-01-12 ENCOUNTER — Other Ambulatory Visit: Payer: Self-pay | Admitting: Family

## 2014-01-12 MED ORDER — OMEGA-3-ACID ETHYL ESTERS 1 G PO CAPS: 1.0000 g | ORAL_CAPSULE | Freq: Two times a day (BID) | ORAL | Status: DC

## 2014-01-13 ENCOUNTER — Other Ambulatory Visit: Payer: Self-pay | Admitting: Family

## 2014-01-13 DIAGNOSIS — E291 Testicular hypofunction: Secondary | ICD-10-CM

## 2014-01-15 ENCOUNTER — Encounter: Payer: Self-pay | Admitting: Family

## 2014-01-15 ENCOUNTER — Telehealth: Payer: Self-pay | Admitting: Family

## 2014-01-15 NOTE — Telephone Encounter (Signed)
Spoke with pt's mom who is listed on his DPR. She had questions concerning his recent Rx for Lovaza. Advised her that pt's trigs are extremely high and that medication will help lower it. Also advised that testosterone is for low testosterone level and that pt has been referred to Urology for management of that. He has an appt on 02/03/14. Pt's mom verbalized understanding

## 2014-01-15 NOTE — Telephone Encounter (Signed)
Pt's mother is requesting a call back, states she has questions about her son's prescriptions.

## 2014-02-03 ENCOUNTER — Ambulatory Visit (INDEPENDENT_AMBULATORY_CARE_PROVIDER_SITE_OTHER)
Admission: RE | Admit: 2014-02-03 | Discharge: 2014-02-03 | Disposition: A | Discharge status: Home / Self Care | Payer: 59 | Source: Ambulatory Visit | Attending: Family | Admitting: Family

## 2014-02-03 DIAGNOSIS — M25569 Pain in unspecified knee: Secondary | ICD-10-CM

## 2014-02-03 DIAGNOSIS — M25561 Pain in right knee: Secondary | ICD-10-CM

## 2014-02-05 ENCOUNTER — Telehealth: Payer: Self-pay | Admitting: Family

## 2014-02-05 MED ORDER — DICLOFENAC SODIUM 75 MG PO TBEC: 75.0000 mg | DELAYED_RELEASE_TABLET | Freq: Two times a day (BID) | ORAL | Status: DC

## 2014-02-05 NOTE — Telephone Encounter (Signed)
Pt states he was advised he has arthritis in his knees, he would like a RX for the pain.  He states when he gets home from work he is extreme pain.

## 2014-02-05 NOTE — Telephone Encounter (Signed)
Please advise 

## 2014-02-17 ENCOUNTER — Other Ambulatory Visit: Payer: Self-pay | Admitting: Family

## 2014-03-16 ENCOUNTER — Other Ambulatory Visit: Payer: Self-pay | Admitting: Family

## 2014-04-10 ENCOUNTER — Other Ambulatory Visit: Payer: Self-pay | Admitting: Family

## 2014-04-11 ENCOUNTER — Other Ambulatory Visit: Payer: Self-pay | Admitting: Family

## 2014-05-06 ENCOUNTER — Ambulatory Visit: Payer: 59 | Admitting: Family

## 2014-05-19 ENCOUNTER — Ambulatory Visit: Payer: 59 | Admitting: Family

## 2014-05-25 ENCOUNTER — Ambulatory Visit (INDEPENDENT_AMBULATORY_CARE_PROVIDER_SITE_OTHER): Payer: 59 | Admitting: Family

## 2014-05-25 ENCOUNTER — Encounter: Payer: Self-pay | Admitting: Family

## 2014-05-25 VITALS — BP 122/78 | HR 86 | Wt 277.0 lb

## 2014-05-25 DIAGNOSIS — Z23 Encounter for immunization: Secondary | ICD-10-CM

## 2014-05-25 DIAGNOSIS — E119 Type 2 diabetes mellitus without complications: Secondary | ICD-10-CM

## 2014-05-25 DIAGNOSIS — F331 Major depressive disorder, recurrent, moderate: Secondary | ICD-10-CM

## 2014-05-25 DIAGNOSIS — F411 Generalized anxiety disorder: Secondary | ICD-10-CM

## 2014-05-25 DIAGNOSIS — E7800 Pure hypercholesterolemia, unspecified: Secondary | ICD-10-CM

## 2014-05-25 LAB — HEPATIC FUNCTION PANEL
ALBUMIN: 3.9 g/dL (ref 3.5–5.2)
ALT: 55 U/L — ABNORMAL HIGH (ref 0–53)
AST: 31 U/L (ref 0–37)
Alkaline Phosphatase: 55 U/L (ref 39–117)
BILIRUBIN DIRECT: 0.1 mg/dL (ref 0.0–0.3)
Total Bilirubin: 0.5 mg/dL (ref 0.2–1.2)
Total Protein: 7.9 g/dL (ref 6.0–8.3)

## 2014-05-25 LAB — BASIC METABOLIC PANEL
BUN: 16 mg/dL (ref 6–23)
CALCIUM: 9.4 mg/dL (ref 8.4–10.5)
CO2: 26 mEq/L (ref 19–32)
Chloride: 104 mEq/L (ref 96–112)
Creatinine, Ser: 1.2 mg/dL (ref 0.4–1.5)
GFR: 68.11 mL/min (ref >60.00)
GLUCOSE: 99 mg/dL (ref 70–99)
Potassium: 4.7 mEq/L (ref 3.5–5.1)
SODIUM: 141 meq/L (ref 135–145)

## 2014-05-25 LAB — CBC WITH DIFFERENTIAL/PLATELET
BASOS ABS: 0 10*3/uL (ref 0.0–0.1)
Basophils Relative: 0.3 % (ref 0.0–3.0)
Eosinophils Absolute: 0.2 10*3/uL (ref 0.0–0.7)
Eosinophils Relative: 2 % (ref 0.0–5.0)
HEMOGLOBIN: 17.8 g/dL — AB (ref 13.0–17.0)
LYMPHS ABS: 4.4 10*3/uL — AB (ref 0.7–4.0)
Lymphocytes Relative: 35.9 % (ref 12.0–46.0)
MCHC: 32.8 g/dL (ref 30.0–36.0)
MCV: 94 fl (ref 78.0–100.0)
MONOS PCT: 6.2 % (ref 3.0–12.0)
Monocytes Absolute: 0.8 10*3/uL (ref 0.1–1.0)
Neutro Abs: 6.8 10*3/uL (ref 1.4–7.7)
Neutrophils Relative %: 55.6 % (ref 43.0–77.0)
Platelets: 291 10*3/uL (ref 150.0–400.0)
RBC: 5.77 Mil/uL (ref 4.22–5.81)
RDW: 13.9 % (ref 11.5–15.5)
WBC: 12.3 10*3/uL — ABNORMAL HIGH (ref 4.0–10.5)

## 2014-05-25 LAB — HEMOGLOBIN A1C: HEMOGLOBIN A1C: 6.2 % (ref 4.6–6.5)

## 2014-05-25 NOTE — Progress Notes (Signed)
Pre visit review using our clinic review tool, if applicable. No additional management support is needed unless otherwise documented below in the visit note. 

## 2014-05-25 NOTE — Patient Instructions (Signed)
Exercise to Lose Weight Exercise and a healthy diet may help you lose weight. Your doctor may suggest specific exercises. EXERCISE IDEAS AND TIPS  Choose low-cost things you enjoy doing, such as walking, bicycling, or exercising to workout videos.  Take stairs instead of the elevator.  Walk during your lunch break.  Park your car further away from work or school.  Go to a gym or an exercise class.  Start with 5 to 10 minutes of exercise each day. Build up to 30 minutes of exercise 4 to 6 days a week.  Wear shoes with good support and comfortable clothes.  Stretch before and after working out.  Work out until you breathe harder and your heart beats faster.  Drink extra water when you exercise.  Do not do so much that you hurt yourself, feel dizzy, or get very short of breath. Exercises that burn about 150 calories:  Running 1  miles in 15 minutes.  Playing volleyball for 45 to 60 minutes.  Washing and waxing a car for 45 to 60 minutes.  Playing touch football for 45 minutes.  Walking 1  miles in 35 minutes.  Pushing a stroller 1  miles in 30 minutes.  Playing basketball for 30 minutes.  Raking leaves for 30 minutes.  Bicycling 5 miles in 30 minutes.  Walking 2 miles in 30 minutes.  Dancing for 30 minutes.  Shoveling snow for 15 minutes.  Swimming laps for 20 minutes.  Walking up stairs for 15 minutes.  Bicycling 4 miles in 15 minutes.  Gardening for 30 to 45 minutes.  Jumping rope for 15 minutes.  Washing windows or floors for 45 to 60 minutes. Document Released: 09/02/2010 Document Revised: 10/23/2011 Document Reviewed: 09/02/2010 ExitCare Patient Information 2015 [employer]. This information is not intended to replace advice given to you by your health care provider. Make sure you discuss any questions you have with your health care provider.  

## 2014-05-25 NOTE — Progress Notes (Signed)
Subjective:    Patient ID: Matthew [name], male    DOB: 01-03-1966, 48 y.o.   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 He has a history of anxiety and depression and reports that every time he thinks about attempting to remember it his anxiety levels increased. His type 2 diabetes, hypogonadism. Urology is caring for a hypogonadism. Does not routinely exercise. He's been working his job 19 years and previous PCP wrote note.    Review of Systems  Constitutional: Negative.   HENT: Negative.   Respiratory: Negative.   Cardiovascular: Negative.   Gastrointestinal: Negative.   Endocrine: Negative.   Genitourinary: Negative.   Musculoskeletal: Negative.   Skin: Negative.   Allergic/Immunologic: Negative.   Neurological: Negative.   Hematological: Negative.   Psychiatric/Behavioral: Negative.    Past Medical History  Diagnosis Date  . Diabetes mellitus   . Mental disorder   . Depression   . Parotid mass     rt  . Sleep apnea     just started on cpap 2 weeks  . Stones in the urinary tract   . GERD (gastroesophageal reflux disease)   . H/O hiatal hernia     ?    History   Social History  . Marital Status: Single    Spouse Name: N/A    Number of Children: N/A  . Years of Education: N/A   Occupational History  . Not on file.   Social History Main Topics  . Smoking status: Former Smoker -- 0.25 packs/day for 5 years    Types: Cigarettes    Quit date: 03/28/2012  . Smokeless tobacco: Not on file     Comment: marijuana 06/26/12  occ social drinker  . Alcohol Use: Yes  . Drug Use: 1.00 per week    Special: Marijuana  . Sexual Activity: Not on file   Other Topics Concern  . Not on file   Social History Narrative  . No narrative on file    Past Surgical History  Procedure Laterality Date  . Parotidectomy  07/04/2012    Procedure: PAROTIDECTOMY;  Surgeon:  Melida Quitter, MD;  Location: [place];  Service: ENT;  Laterality: Right;  right parotidectomy  . Hematoma evacuation  07/04/2012    Procedure: EVACUATION HEMATOMA;  Surgeon: Melida Quitter, MD;  Location: [facility] OR;  Service: ENT;  Laterality: Right;    Family History  Problem Relation Age of Onset  . Cancer Mother     breast  . Diabetes Father   . Stroke Brother     No Known Allergies  Current Outpatient Prescriptions on File Prior to Visit  Medication Sig Dispense Refill  . amitriptyline (ELAVIL) 100 MG tablet TAKE 1 TABLET EVERY DAY  90 tablet  1  . busPIRone (BUSPAR) 15 MG tablet Take 15 mg by mouth daily.      . clonazePAM (KLONOPIN) 2 MG tablet TAKE 1 TABLET TWICE A DAY AS NEEDED FOR ANXIETY  180 tablet  1  . diclofenac (VOLTAREN) 75 MG EC tablet TAKE 1 TABLET BY MOUTH TWICE A DAY  60 tablet  4  . diclofenac (VOLTAREN) 75 MG EC tablet TAKE 1 TABLET BY MOUTH TWICE A DAY  60 tablet  1  . fenofibrate (TRICOR) 48 MG tablet Take 1 tablet (48 mg total) by mouth daily.  90 tablet  1  . HYDROcodone-acetaminophen (NORCO/VICODIN) 5-325 MG per  tablet TAKE 1 TABLET BY MOUTH EVERY 8 HOURS AS NEEDED  30 tablet  0  . ibuprofen (ADVIL,MOTRIN) 200 MG tablet Take 600 mg by mouth every 6 (six) hours as needed. For pain      . metFORMIN (GLUCOPHAGE) 500 MG tablet TAKE 1 TABLET BY MOUTH 2 TIMES DAILY WITH A MEAL.  180 tablet  0  . omega-3 acid ethyl esters (LOVAZA) 1 G capsule Take 1 capsule (1 g total) by mouth 2 (two) times daily.  60 capsule  3  . omeprazole (PRILOSEC) 20 MG capsule Take 1 capsule (20 mg total) by mouth daily.  90 capsule  1  . omeprazole (PRILOSEC) 20 MG capsule TAKE ONE CAPSULE EVERY DAY  30 capsule  4  . Syringe/Needle, Disp, 20G X 1" 1 ML MISC Inject 1 mL into the muscle every 14 (fourteen) days.  100 each  0  . tamsulosin (FLOMAX) 0.4 MG CAPS Take 1 capsule (0.4 mg total) by mouth daily.  30 capsule  3  . testosterone cypionate (DEPOTESTOTERONE CYPIONATE) 200 MG/ML injection INJECT 1  ML INTO THE MUSCLE EVERY 14 DAYS  10 mL  0  . traMADol (ULTRAM) 50 MG tablet TAKE 1 TABLET EVERY 6 HOURS AS NEEDED FOR PAIN (AFTER MEALS)  60 tablet  4   No current facility-administered medications on file prior to visit.    BP 122/78  Pulse 86  Wt 277 lb (125.646 kg)chart    Objective:   Physical Exam  Constitutional: He is oriented to person, place, and time. He appears well-developed and well-nourished.  HENT:  Right Ear: External ear normal.  Left Ear: External ear normal.  Nose: Nose normal.  Mouth/Throat: Oropharynx is clear and moist.  Neck: Normal range of motion. Neck supple.  Cardiovascular: Normal rate and normal heart sounds.   Pulmonary/Chest: Effort normal and breath sounds normal.  Abdominal: Soft. Bowel sounds are normal.  Musculoskeletal: Normal range of motion.  Neurological: He is alert and oriented to person, place, and time.  Skin: Skin is warm and dry.  Psychiatric: He has a normal mood and affect.          Assessment & Plan:  Jordanny was seen today for no specified reason.  Diagnoses and associated orders for this visit:  Generalized anxiety disorder - CBC with Differential  Major depressive disorder, recurrent episode, moderate - CBC with Differential  Type 2 diabetes mellitus without complication - Hemoglobin H0W - Basic Metabolic Panel - Hepatic Function Panel - Microalbumin/Creatinine Ratio, Urine - CBC with Differential  Pure hypercholesterolemia - Basic Metabolic Panel - Hepatic Function Panel - CBC with Differential  Need for prophylactic vaccination against Streptococcus pneumoniae (pneumococcus) - Pneumococcal conjugate vaccine 13-valent  Encounter for immunization   Call the office with any questions or concerns. Recheck in 3 months and sooner as needed.

## 2014-05-26 ENCOUNTER — Telehealth: Payer: Self-pay | Admitting: Family

## 2014-05-26 NOTE — Telephone Encounter (Signed)
[id number] [place], [place] - [address] is requesting 90 day re-fill on omega-3 acid ethyl esters (LOVAZA) 1 G capsule

## 2014-05-27 LAB — MICROALBUMIN / CREATININE URINE RATIO
Creatinine,U: 193.1 mg/dL
Microalb Creat Ratio: 0.3 mg/g (ref 0.0–30.0)
Microalb, Ur: 0.6 mg/dL (ref 0.0–1.9)

## 2014-05-27 MED ORDER — OMEGA-3-ACID ETHYL ESTERS 1 G PO CAPS: 1.0000 g | ORAL_CAPSULE | Freq: Two times a day (BID) | ORAL | Status: DC

## 2014-05-27 NOTE — Telephone Encounter (Signed)
Done

## 2014-07-13 ENCOUNTER — Other Ambulatory Visit: Payer: Self-pay | Admitting: Family

## 2014-08-19 ENCOUNTER — Other Ambulatory Visit: Payer: Self-pay | Admitting: Family

## 2014-09-16 ENCOUNTER — Ambulatory Visit: Payer: Self-pay | Admitting: Family

## 2014-09-17 ENCOUNTER — Encounter: Payer: Self-pay | Admitting: Family

## 2014-09-17 ENCOUNTER — Ambulatory Visit (INDEPENDENT_AMBULATORY_CARE_PROVIDER_SITE_OTHER): Payer: 59 | Admitting: Family

## 2014-09-17 VITALS — BP 130/78 | Temp 98.2°F | Wt 277.0 lb

## 2014-09-17 DIAGNOSIS — E7800 Pure hypercholesterolemia, unspecified: Secondary | ICD-10-CM

## 2014-09-17 DIAGNOSIS — E119 Type 2 diabetes mellitus without complications: Secondary | ICD-10-CM

## 2014-09-17 DIAGNOSIS — F411 Generalized anxiety disorder: Secondary | ICD-10-CM

## 2014-09-17 DIAGNOSIS — K219 Gastro-esophageal reflux disease without esophagitis: Secondary | ICD-10-CM

## 2014-09-17 LAB — BASIC METABOLIC PANEL
BUN: 19 mg/dL (ref 6–23)
CALCIUM: 9.7 mg/dL (ref 8.4–10.5)
CO2: 28 meq/L (ref 19–32)
CREATININE: 1.36 mg/dL (ref 0.40–1.50)
Chloride: 102 mEq/L (ref 96–112)
GFR: 58.87 mL/min — ABNORMAL LOW (ref >60.00)
Glucose, Bld: 104 mg/dL — ABNORMAL HIGH (ref 70–99)
Potassium: 4.4 mEq/L (ref 3.5–5.1)
Sodium: 140 mEq/L (ref 135–145)

## 2014-09-17 LAB — LIPID PANEL
CHOL/HDL RATIO: 5
Cholesterol: 179 mg/dL (ref 0–200)
HDL: 38.2 mg/dL — ABNORMAL LOW (ref >39.00)
NONHDL: 140.8
Triglycerides: 349 mg/dL — ABNORMAL HIGH (ref 0.0–149.0)
VLDL: 69.8 mg/dL — ABNORMAL HIGH (ref 0.0–40.0)

## 2014-09-17 LAB — HEPATIC FUNCTION PANEL
ALBUMIN: 4.7 g/dL (ref 3.5–5.2)
ALT: 58 U/L — AB (ref 0–53)
AST: 29 U/L (ref 0–37)
Alkaline Phosphatase: 57 U/L (ref 39–117)
BILIRUBIN DIRECT: 0.1 mg/dL (ref 0.0–0.3)
Total Bilirubin: 0.4 mg/dL (ref 0.2–1.2)
Total Protein: 7.4 g/dL (ref 6.0–8.3)

## 2014-09-17 LAB — LDL CHOLESTEROL, DIRECT: LDL DIRECT: 84 mg/dL

## 2014-09-17 LAB — HEMOGLOBIN A1C: HEMOGLOBIN A1C: 5.8 % (ref 4.6–6.5)

## 2014-09-17 MED ORDER — DICLOFENAC SODIUM 75 MG PO TBEC: 75.0000 mg | DELAYED_RELEASE_TABLET | Freq: Two times a day (BID) | ORAL | Status: DC

## 2014-09-17 MED ORDER — TRAMADOL HCL 50 MG PO TABS: ORAL_TABLET | ORAL | Status: DC

## 2014-09-17 MED ORDER — OMEPRAZOLE 20 MG PO CPDR: 20.0000 mg | DELAYED_RELEASE_CAPSULE | Freq: Every day | ORAL | Status: DC

## 2014-09-17 MED ORDER — METFORMIN HCL 500 MG PO TABS: ORAL_TABLET | ORAL | Status: DC

## 2014-09-17 MED ORDER — AMITRIPTYLINE HCL 100 MG PO TABS: 100.0000 mg | ORAL_TABLET | Freq: Every day | ORAL | Status: DC

## 2014-09-17 NOTE — Progress Notes (Signed)
Subjective:    Patient ID: Matthew [name], male    DOB: [birth year], 49 y.o.   MRN: [id number]  HPI 49 year old white male, smoker with a history of anxiety, type 2 diabetes, morbid obesity, hypercholesterolemia, hypogonadism is in today for recheck. Sees urology for management of hypergonadism. Requesting refills on medications. Weight is down approximately 20 pounds from a year ago. Does not routinely exercise but tries to follow a healthy diet.   Review of Systems  Constitutional: Negative.   HENT: Negative.   Respiratory: Negative.   Cardiovascular: Negative.   Gastrointestinal: Negative.   Endocrine: Negative.   Genitourinary: Negative.   Musculoskeletal: Negative.   Skin: Negative.   Allergic/Immunologic: Negative.   Neurological: Negative.   Hematological: Negative.   Psychiatric/Behavioral: Negative.    Past Medical History  Diagnosis Date  . Diabetes mellitus   . Mental disorder   . Depression   . Parotid mass     rt  . Sleep apnea     just started on cpap 2 weeks  . Stones in the urinary tract   . GERD (gastroesophageal reflux disease)   . H/O hiatal hernia     ?    History   Social History  . Marital Status: Single    Spouse Name: N/A    Number of Children: N/A  . Years of Education: N/A   Occupational History  . Not on file.   Social History Main Topics  . Smoking status: Former Smoker -- 0.25 packs/day for 5 years    Types: Cigarettes    Quit date: 03/28/2012  . Smokeless tobacco: Not on file     Comment: marijuana 06/26/12  occ social drinker  . Alcohol Use: Yes  . Drug Use: 1.00 per week    Special: Marijuana  . Sexual Activity: Not on file   Other Topics Concern  . Not on file   Social History Narrative    Past Surgical History  Procedure Laterality Date  . Parotidectomy  07/04/2012    Procedure: PAROTIDECTOMY;  Surgeon: Melida Quitter, MD;  Location: [place];  Service: ENT;  Laterality: Right;  right parotidectomy  . Hematoma  evacuation  07/04/2012    Procedure: EVACUATION HEMATOMA;  Surgeon: Melida Quitter, MD;  [facility];  Service: ENT;  Laterality: Right;    Family History  Problem Relation Age of Onset  . Cancer Mother     breast  . Diabetes Father   . Stroke Brother     No Known Allergies  Current Outpatient Prescriptions on File Prior to Visit  Medication Sig Dispense Refill  . busPIRone (BUSPAR) 15 MG tablet Take 15 mg by mouth daily.    . clonazePAM (KLONOPIN) 2 MG tablet TAKE 1 TABLET BY MOUTH TWICE A DAY AS NEEDED FOR ANXIETY 180 tablet 0  . fenofibrate (TRICOR) 48 MG tablet Take 1 tablet (48 mg total) by mouth daily. 90 tablet 1  . HYDROcodone-acetaminophen (NORCO/VICODIN) 5-325 MG per tablet TAKE 1 TABLET BY MOUTH EVERY 8 HOURS AS NEEDED 30 tablet 0  . ibuprofen (ADVIL,MOTRIN) 200 MG tablet Take 600 mg by mouth every 6 (six) hours as needed. For pain    . omega-3 acid ethyl esters (LOVAZA) 1 G capsule Take 1 capsule (1 g total) by mouth 2 (two) times daily. 60 capsule 3  . Syringe/Needle, Disp, 20G X 1" 1 ML MISC Inject 1 mL into the muscle every 14 (fourteen) days. 100 each 0  . tamsulosin (FLOMAX) 0.4 MG CAPS  Take 1 capsule (0.4 mg total) by mouth daily. 30 capsule 3  . testosterone cypionate (DEPOTESTOTERONE CYPIONATE) 200 MG/ML injection INJECT 1 ML INTO THE MUSCLE EVERY 14 DAYS 10 mL 0   No current facility-administered medications on file prior to visit.    BP 130/78 mmHg  Temp(Src) 98.2 F (36.8 C) (Oral)  Wt 277 lb (125.646 kg)chart    Objective:   Physical Exam  Constitutional: He is oriented to person, place, and time. He appears well-developed and well-nourished.  HENT:  Right Ear: External ear normal.  Left Ear: External ear normal.  Nose: Nose normal.  Mouth/Throat: Oropharynx is clear and moist.  Neck: Normal range of motion. Neck supple.  Cardiovascular: Normal rate, regular rhythm and normal heart sounds.   Pulmonary/Chest: Effort normal and breath sounds normal.   Abdominal: Soft. Bowel sounds are normal.  Musculoskeletal: Normal range of motion.  Neurological: He is alert and oriented to person, place, and time.  Skin: Skin is warm and dry.  Psychiatric: He has a normal mood and affect.          Assessment & Plan:  Mychal was seen today for follow-up.  Diagnoses and associated orders for this visit:  Type 2 diabetes mellitus without complication - Hemoglobin Y5W - Basic Metabolic Panel - Hepatic Function Panel - Lipid Panel  Generalized anxiety disorder  Gastroesophageal reflux disease without esophagitis  Pure hypercholesterolemia - Lipid Panel  Other Orders - omeprazole (PRILOSEC) 20 MG capsule; Take 1 capsule (20 mg total) by mouth daily. - diclofenac (VOLTAREN) 75 MG EC tablet; Take 1 tablet (75 mg total) by mouth 2 (two) times daily. - amitriptyline (ELAVIL) 100 MG tablet; Take 1 tablet (100 mg total) by mouth daily. - metFORMIN (GLUCOPHAGE) 500 MG tablet; TAKE 1 TABLET BY MOUTH 2 TIMES DAILY WITH A MEAL. - traMADol (ULTRAM) 50 MG tablet; TAKE 1 TABLET EVERY 6 HOURS AS NEEDED FOR PAIN (AFTER MEALS)    Continue current medications. Labs sent today. Call the office with any questions or concerns. Strongly encourage smoking cessation. Recheck in 3 months and sooner as needed.

## 2014-09-17 NOTE — Patient Instructions (Signed)
Diabetes and Exercise Diabetes mellitus is a common, chronic disease, in which the pancreas is unable to adequately control blood glucose (sugar) levels. There are 2 types of diabetes. Type 1 diabetes patients are unable to produce insulin, a hormone that causes sugar in the blood to be stored in the body. People with type 1 diabetes may compensate by giving themselves injections of insulin. Type 2 diabetes involves not producing adequate amounts of insulin to control blood glucose levels. People with type 2 diabetes control their blood glucose by monitoring their food intake or by taking medicine. Exercise is an important part of diabetes treatment. During exercise, the muscles use a greater amount of glucose from the blood for energy. This lowers your blood glucose, which is the same effect you would get from taking insulin. It has been shown that endurance athletes are more sensitive to insulin than inactive people. SYMPTOMS  Many people with a mild case of diabetes have no symptoms. However, if left uncontrolled, diabetes can lead to several complications that could be prevented with treatment of the disease. General symptoms of diabetes include:   Frequent urination (polyuria).  Frequent thirst and drinking (polydipsia).  Increased food consumption (polyphagia).  Fatigue.  Poor exercise performance.  Blurred vision.  Inflammation of the vagina (vaginitis) caused by fungal infections.  Skin infections (uncommon).  Numbness in the feet, caused by nerve injury.  Kidney disease. CAUSES  The cause of most cases of diabetes is unknown. In children, diabetes is often due to an autoimmune response to the cells in the pancreas that make insulin. It is also linked with other diseases, such as cystic fibrosis. Diabetes may have a genetic link. PREVENTION  Athletes should strive to begin exercise with blood glucose in a well-controlled state.  Feet should always be kept clean and  dry.  Activities in which low blood sugar levels cannot be treated easily (scuba diving, rock climbing, swimming) should be avoided.  Anticipate alterations in diet or training to avoid low blood sugar (hypoglycemia) and high blood sugar (hyperglycemia).  Athletes should try to increase sugar consumption after strenuous exercise to avoid hypoglycemia.  Short-acting insulin should not be injected into an actively exercising muscle. The athlete should rest the injection site for about 1 hour after exercise.  Patients with diabetes should get routine checkups of the feet to prevent complications. PROGNOSIS  Exercise provides many benefits to the person with diabetes:   Reduced body fat.  Lower blood pressure.  Often, reduced need for medicines.  Improved exercise tolerance.  Lower insulin levels.  Weight loss.  Improved lipid profile (decreased cholesterol and low-density lipoproteins). RELATED COMPLICATIONS  If performed incorrectly, exercise can result in complications of diabetes:   Poor control of blood sugar, when exercise is performed at the wrong time.  Increase in renal disease, from loss of body fluids (dehydration).  Increased risk of nerve injury (neuropathy) when performing exercises that increase foot injury.  Increased risk of eye problems when performing activities that involve breath holding or lowering or jarring the head.  Increased risk of sudden death from exercise in patients with heart disease.  Worsening of hypertension with heavy lifting (more than 10 lb/4.5 kg). Altered blood glucose and insulin dose as a result of mild illness that produces loss of appetite.  Altered uptake of insulin after injection when insulin injection site is changed. NOTE: Exercise can lower blood glucose effectively, but the effects are short-lasting (no more than a couple of days). Exercise has been shown to   improve your sensitivity to insulin. This may alter how your body  responds to a given dose of injected insulin. It is important for every patient with diabetes to know how his or her body may react to exercise, and to adjust insulin dosages accordingly. TREATMENT  Eat about 1 to 3 hours before exercise.  Check blood glucose immediately before and after exercise.  Stop exercise if blood glucose is more than 250 mg/dL.  Stop exercise if blood glucose is less than 100 mg/dL.  Do not exercise within 1 hour of an insulin injection.  Be prepared to treat low blood glucose while exercising. Keep some sugar product with you, such as a candy bar.  For prolonged exercise, use a sports drink to maintain your glucose level.  Replace used-up glucose in the body after exercise.  Consume fluids during and after exercise to avoid dehydration. SEEK MEDICAL CARE IF:  You have vision changes after a run.  You notice a loss of sensation in your feet after exercise.  You have increased numbness, tingling, or pins and needles sensations after exercise.  You have chest pain during or after exercise.  You have a fast, irregular heartbeat (palpitations) during or after exercise.  Your exercise tolerance gets worse.  You have fainting or dizzy spells for brief periods during or after exercise. Document Released: 07/31/2005 Document Revised: 12/15/2013 Document Reviewed: 11/12/2008 Exit[employer] Patient Information 2015 [employer]. This information is not intended to replace advice given to you by your health care provider. Make sure you discuss any questions you have with your health care provider.  

## 2014-09-17 NOTE — Progress Notes (Signed)
Pre visit review using our clinic review tool, if applicable. No additional management support is needed unless otherwise documented below in the visit note. 

## 2014-09-24 ENCOUNTER — Other Ambulatory Visit: Payer: Self-pay | Admitting: Family

## 2014-10-16 ENCOUNTER — Other Ambulatory Visit: Payer: Self-pay | Admitting: Family

## 2014-10-21 ENCOUNTER — Other Ambulatory Visit: Payer: Self-pay | Admitting: Family

## 2014-11-23 ENCOUNTER — Other Ambulatory Visit: Payer: Self-pay | Admitting: Family

## 2014-11-24 ENCOUNTER — Telehealth: Payer: Self-pay | Admitting: Family

## 2014-11-24 MED ORDER — DICLOFENAC SODIUM 75 MG PO TBEC: 75.0000 mg | DELAYED_RELEASE_TABLET | Freq: Two times a day (BID) | ORAL | Status: DC

## 2014-11-24 MED ORDER — TRAMADOL HCL 50 MG PO TABS: ORAL_TABLET | ORAL | Status: DC

## 2014-11-24 NOTE — Telephone Encounter (Signed)
Pt is questioning why we are not refilling his Ultram. He was seen last on 09/17/14. He also needs a refill on his diclofenac. [facility] [address] He works [employer] and goes to bed around 10:30AM.

## 2014-11-24 NOTE — Telephone Encounter (Signed)
Ultram phoned in. Diclofenac escribed. Pt needs to schedule appointment to est with a provider. He has been made aware of this need several times

## 2014-12-08 ENCOUNTER — Ambulatory Visit: Payer: 59 | Admitting: Adult Health

## 2014-12-11 ENCOUNTER — Telehealth: Payer: Self-pay | Admitting: Family

## 2014-12-11 NOTE — Telephone Encounter (Signed)
Spoke with pt and all he wants to know is who Abby Potash suggests he establishes care with. I spoke with [place] before calling the pt back and she recommends [place]. I advised pt and he will call back to schedule an appointment with [facility]

## 2014-12-11 NOTE — Telephone Encounter (Signed)
Patient's mother called requesting a callback about an injury on her son's leg.  Patient's mother declined an appointment. I called patient back and offered to have him talk to a triage nurse.  He declined stating his leg is fine and need re-fills on his medication.  I offered to schedule him an appointment with Tommi Rumps and he declined stating he just want his medications without having to make an appointment and then put his mother back on the phone.  She began asking for re-fills for herself.

## 2015-01-13 ENCOUNTER — Other Ambulatory Visit: Payer: Self-pay | Admitting: Gastroenterology

## 2015-02-16 ENCOUNTER — Other Ambulatory Visit: Payer: Self-pay | Admitting: Family

## 2015-03-26 ENCOUNTER — Other Ambulatory Visit: Payer: Self-pay | Admitting: Family

## 2015-05-13 ENCOUNTER — Other Ambulatory Visit: Payer: Self-pay | Admitting: Family

## 2015-05-26 ENCOUNTER — Other Ambulatory Visit: Payer: Self-pay | Admitting: Family

## 2015-07-16 ENCOUNTER — Other Ambulatory Visit: Payer: Self-pay | Admitting: Family Medicine

## 2015-07-16 ENCOUNTER — Ambulatory Visit (INDEPENDENT_AMBULATORY_CARE_PROVIDER_SITE_OTHER): Payer: 59 | Admitting: Family Medicine

## 2015-07-16 ENCOUNTER — Encounter: Payer: Self-pay | Admitting: Family Medicine

## 2015-07-16 VITALS — BP 136/88 | HR 95 | Temp 99.1°F | Ht 71.0 in | Wt 291.0 lb

## 2015-07-16 DIAGNOSIS — F411 Generalized anxiety disorder: Secondary | ICD-10-CM

## 2015-07-16 DIAGNOSIS — E119 Type 2 diabetes mellitus without complications: Secondary | ICD-10-CM | POA: Diagnosis not present

## 2015-07-16 DIAGNOSIS — Z23 Encounter for immunization: Secondary | ICD-10-CM | POA: Diagnosis not present

## 2015-07-16 DIAGNOSIS — K219 Gastro-esophageal reflux disease without esophagitis: Secondary | ICD-10-CM | POA: Insufficient documentation

## 2015-07-16 DIAGNOSIS — Z20828 Contact with and (suspected) exposure to other viral communicable diseases: Secondary | ICD-10-CM

## 2015-07-16 DIAGNOSIS — R351 Nocturia: Secondary | ICD-10-CM

## 2015-07-16 DIAGNOSIS — E7800 Pure hypercholesterolemia, unspecified: Secondary | ICD-10-CM | POA: Diagnosis not present

## 2015-07-16 DIAGNOSIS — N401 Enlarged prostate with lower urinary tract symptoms: Secondary | ICD-10-CM | POA: Insufficient documentation

## 2015-07-16 DIAGNOSIS — M1990 Unspecified osteoarthritis, unspecified site: Secondary | ICD-10-CM | POA: Insufficient documentation

## 2015-07-16 LAB — HEMOGLOBIN A1C: Hgb A1c MFr Bld: 8.7 % — ABNORMAL HIGH (ref 4.6–6.5)

## 2015-07-16 MED ORDER — CLONAZEPAM 2 MG PO TABS: ORAL_TABLET | ORAL | Status: DC

## 2015-07-16 MED ORDER — OMEPRAZOLE 20 MG PO CPDR: 20.0000 mg | DELAYED_RELEASE_CAPSULE | Freq: Every day | ORAL | Status: DC

## 2015-07-16 MED ORDER — METFORMIN HCL 500 MG PO TABS: ORAL_TABLET | ORAL | Status: DC

## 2015-07-16 MED ORDER — DICLOFENAC SODIUM 75 MG PO TBEC: 75.0000 mg | DELAYED_RELEASE_TABLET | Freq: Two times a day (BID) | ORAL | Status: DC

## 2015-07-16 MED ORDER — AMITRIPTYLINE HCL 100 MG PO TABS: ORAL_TABLET | ORAL | Status: DC

## 2015-07-16 MED ORDER — TRAMADOL HCL 50 MG PO TABS: 50.0000 mg | ORAL_TABLET | Freq: Four times a day (QID) | ORAL | Status: DC | PRN

## 2015-07-16 NOTE — Assessment & Plan Note (Signed)
S: Patient drastically needs weight loss. Already has issues with his R knee, diabetes, HLD. Worry about trend on BP as well A/P: Encouraged need for healthy eating, regular exercise, weight loss. Follow up in 3 months

## 2015-07-16 NOTE — Assessment & Plan Note (Signed)
S: poorly controlled. On metformin 500mg  BID but had not taken medication for several months until a month ago CBGs- does not check Exercise and diet- poor choices, weight gain, not exercising  Lab Results  Component Value Date   HGBA1C 8.7* 07/16/2015   HGBA1C 5.8 09/17/2014   HGBA1C 6.2 05/25/2014   A/P: will have Keba call patient and titrate metformin to 1000mg  BID and reinforce healthy lifestyle changes needed as I already discussed with patient. We set a goal of 225 lbs down form basically 300 over next 1-2 years.

## 2015-07-16 NOTE — Assessment & Plan Note (Signed)
S: controlled on omeprazole 20mg , recurrence of symptoms off A/P: continue current rx

## 2015-07-16 NOTE — Assessment & Plan Note (Signed)
S: poorly controlled on no medication. No myalgias. Formerly on tricor 48mg  and livalo- has not taken since 2015.  Lab Results  Component Value Date   CHOL 179 09/17/2014   HDL 38.20* 09/17/2014   LDLCALC 81 01/09/2014   LDLDIRECT 84.0 09/17/2014   TRIG 349.0* 09/17/2014   CHOLHDL 5 09/17/2014   A/P: patient is rather sedentary and not eating well. We had a long discussion about need for weight loss, exercise, healthy eating. If #s remain high at 6 months- would consider statin instead of tricor given trig 200-500 and main risk is cardiac.

## 2015-07-16 NOTE — Assessment & Plan Note (Signed)
S: reasonable control on Amitriptyline 100mg , and Klonopin 2mg  9pm each day before work- 2mg  daily (listed as BID but takes daily) A/P: discussed that when anxiety requires daily benzodiazepine I strongly suggest psychiatry follow up. Has never had CBT and suspect may help. I did refill today but told patient we would need to discuss transition over next few visits.

## 2015-07-16 NOTE — Progress Notes (Signed)
Garret Reddish, MD  Subjective:  Matthew [name] is a 49 y.o. year old very pleasant male patient who presents for/with See problem oriented charting ROS- 6 months ago quit all medicationsand had headaches and did not feel well. Occasional cold sweats Started back about a month ago and doing much better. No chest pain or shortness of breath. No blurry vision.   Past Medical History-  Patient Active Problem List   Diagnosis Date Noted  . Type 2 diabetes mellitus ([place]) 01/24/2013    Priority: High  . Morbid obesity ([place]) 01/24/2013    Priority: High  . BPH associated with nocturia 07/16/2015    Priority: Medium  . Hypogonadism, male 02/21/2013    Priority: Medium  . Pure hypercholesterolemia 01/24/2013    Priority: Medium  . Anxiety state 01/24/2013    Priority: Medium  . Depression 01/24/2013    Priority: Medium  . GERD (gastroesophageal reflux disease) 07/16/2015    Priority: Low   Medications- reviewed and updated Current Outpatient Prescriptions  Medication Sig Dispense Refill  . amitriptyline (ELAVIL) 100 MG tablet TAKE 1 TABLET (100 MG TOTAL) BY MOUTH DAILY. 90 tablet 1  . aspirin 81 MG tablet Take 81 mg by mouth daily.    . clonazePAM (KLONOPIN) 2 MG tablet TAKE 1 TABLET TWICE A DAY AS NEEDED ANXIETY 180 tablet 0  . diclofenac (VOLTAREN) 75 MG EC tablet Take 1 tablet (75 mg total) by mouth 2 (two) times daily. 60 tablet 4  . metFORMIN (GLUCOPHAGE) 500 MG tablet TAKE 1 TABLET BY MOUTH 2 TIMES DAILY WITH A MEAL. 180 tablet 3  . omeprazole (PRILOSEC) 20 MG capsule Take 1 capsule (20 mg total) by mouth daily. 90 capsule 3  . AXIRON 30 MG/ACT SOLN Per urology  3  . traMADol (ULTRAM) 50 MG tablet Take 1 tablet (50 mg total) by mouth every 6 (six) hours as needed. for pain 60 tablet 0   No current facility-administered medications for this visit.    Objective: BP 136/88 mmHg  Pulse 95  Temp(Src) 99.1 F (37.3 C)  Ht 5\' 11"  (1.803 m)  Wt 291 lb (131.997 kg)  BMI 40.60  kg/m2 Gen: NAD, resting comfortably CV: RRR no murmurs rubs or gallops Lungs: CTAB no crackles, wheeze, rhonchi Abdomen: soft/nontender/nondistended/normal bowel sounds. No rebound or guarding. Obese.  Ext: no edema Skin: warm, dry Neuro: grossly normal, moves all extremities  Assessment/Plan:  Type 2 diabetes mellitus ([place]) S: poorly controlled. On metformin 500mg  BID but had not taken medication for several months until a month ago CBGs- does not check Exercise and diet- poor choices, weight gain, not exercising  Lab Results  Component Value Date   HGBA1C 8.7* 07/16/2015   HGBA1C 5.8 09/17/2014   HGBA1C 6.2 05/25/2014   A/P: will have Keba call patient and titrate metformin to 1000mg  BID and reinforce healthy lifestyle changes needed as I already discussed with patient. We set a goal of 225 lbs down form basically 300 over next 1-2 years.       Anxiety state S: reasonable control on Amitriptyline 100mg , and Klonopin 2mg  9pm each day before work- 2mg  daily (listed as BID but takes daily) A/P: discussed that when anxiety requires daily benzodiazepine I strongly suggest psychiatry follow up. Has never had CBT and suspect may help. I did refill today but told patient we would need to discuss transition over next few visits.    Pure hypercholesterolemia S: poorly controlled on no medication. No myalgias. Formerly on tricor 48mg   and livalo- has not taken since 2015.  Lab Results  Component Value Date   CHOL 179 09/17/2014   HDL 38.20* 09/17/2014   LDLCALC 81 01/09/2014   LDLDIRECT 84.0 09/17/2014   TRIG 349.0* 09/17/2014   CHOLHDL 5 09/17/2014   A/P: patient is rather sedentary and not eating well. We had a long discussion about need for weight loss, exercise, healthy eating. If #s remain high at 6 months- would consider statin instead of tricor given trig 200-500 and main risk is cardiac.     GERD (gastroesophageal reflux disease) S: controlled on omeprazole 20mg ,  recurrence of symptoms off A/P: continue current rx   Morbid obesity ([place]) S: Patient drastically needs weight loss. Already has issues with his R knee, diabetes, HLD. Worry about trend on BP as well A/P: Encouraged need for healthy eating, regular exercise, weight loss. Follow up in 3 months    3 months, 1 day.  Return precautions advised.   Orders Placed This Encounter  Procedures  . Flu Vaccine QUAD 36+ mos IM  . Tdap vaccine greater than or equal to 7yo IM  . Hemoglobin A1c    [place]  . Hepatitis C antibody, reflex    [facility]  . HIV antibody  . Microalbumin / creatinine urine ratio    Standing Status: Future     Number of Occurrences:      Standing Expiration Date: 07/15/2016    Meds ordered this encounter  Medications  . AXIRON 30 MG/ACT SOLN    Sig: Per urology    Refill:  3  . traMADol (ULTRAM) 50 MG tablet    Sig: Take 1 tablet (50 mg total) by mouth every 6 (six) hours as needed. for pain    Dispense:  60 tablet    Refill:  0  . omeprazole (PRILOSEC) 20 MG capsule    Sig: Take 1 capsule (20 mg total) by mouth daily.    Dispense:  90 capsule    Refill:  3  . metFORMIN (GLUCOPHAGE) 500 MG tablet    Sig: TAKE 1 TABLET BY MOUTH 2 TIMES DAILY WITH A MEAL.    Dispense:  180 tablet    Refill:  3  . diclofenac (VOLTAREN) 75 MG EC tablet    Sig: Take 1 tablet (75 mg total) by mouth 2 (two) times daily.    Dispense:  60 tablet    Refill:  4  . clonazePAM (KLONOPIN) 2 MG tablet    Sig: TAKE 1 TABLET TWICE A DAY AS NEEDED ANXIETY    Dispense:  180 tablet    Refill:  0  . amitriptyline (ELAVIL) 100 MG tablet    Sig: TAKE 1 TABLET (100 MG TOTAL) BY MOUTH DAILY.    Dispense:  90 tablet    Refill:  1  . aspirin 81 MG tablet    Sig: Take 81 mg by mouth daily.   Warned serotonin syndrome signs given tramadol and amitriptyline.  Advised Asa 81 start

## 2015-07-16 NOTE — Patient Instructions (Addendum)
Flu shot received today.  Tdap (tetanus before you go)  Get eye exam scheduled and have results faxed to [place] at [phone].  Bloodwork before you go. Urine test at next visit  Refilled all your medicine but testosterone  See me before you run out of klonopin- only refill this in person.   I would prefer to transition you to psychiatry to help you deal with the anxiety/stress better- discuss at follow up

## 2015-07-17 LAB — HEPATITIS C ANTIBODY: HCV AB: NEGATIVE

## 2015-07-19 ENCOUNTER — Other Ambulatory Visit: Payer: Self-pay

## 2015-07-19 LAB — HIV ANTIBODY (ROUTINE TESTING W REFLEX): HIV 1&2 Ab, 4th Generation: NONREACTIVE

## 2015-07-19 MED ORDER — METFORMIN HCL 1000 MG PO TABS: 1000.0000 mg | ORAL_TABLET | Freq: Two times a day (BID) | ORAL | Status: DC

## 2015-08-12 ENCOUNTER — Other Ambulatory Visit: Payer: Self-pay | Admitting: Family

## 2015-08-25 ENCOUNTER — Other Ambulatory Visit: Payer: Self-pay | Admitting: Family Medicine

## 2015-08-25 NOTE — Telephone Encounter (Signed)
Yes thanks 

## 2015-08-25 NOTE — Telephone Encounter (Signed)
Refill ok? 

## 2015-11-04 ENCOUNTER — Other Ambulatory Visit: Payer: Self-pay | Admitting: General Practice

## 2015-11-04 MED ORDER — METFORMIN HCL 1000 MG PO TABS: 1000.0000 mg | ORAL_TABLET | Freq: Two times a day (BID) | ORAL | Status: DC

## 2016-01-30 ENCOUNTER — Other Ambulatory Visit: Payer: Self-pay | Admitting: Family Medicine

## 2016-02-02 ENCOUNTER — Telehealth: Payer: Self-pay

## 2016-02-02 NOTE — Telephone Encounter (Signed)
Called patient and left voicemail message for patient to call back and make a follow up appointment regarding GERD. Patient was supposed to follow up in 3 months ans was last seen 12/16.

## 2016-02-03 NOTE — Telephone Encounter (Signed)
OK to refill Tramadol 

## 2016-02-03 NOTE — Telephone Encounter (Signed)
No refill. In December I advised three-month follow-up. He needs to schedule this for any refills .

## 2016-02-04 NOTE — Telephone Encounter (Signed)
Refilled Amitriptyline & Glucophage. Denied request for Ultram & Klonopin. Patient needs to be seen in office to be assessed.

## 2016-02-04 NOTE — Telephone Encounter (Signed)
Matthew [name]- can you call patient? i am not sure if that communication goes through to pharmacy other than refusal

## 2016-04-10 ENCOUNTER — Ambulatory Visit (INDEPENDENT_AMBULATORY_CARE_PROVIDER_SITE_OTHER): Payer: 59 | Admitting: Family Medicine

## 2016-04-10 ENCOUNTER — Encounter: Payer: Self-pay | Admitting: Family Medicine

## 2016-04-10 VITALS — BP 118/80 | HR 78 | Temp 98.0°F | Resp 12 | Ht 71.0 in | Wt 276.4 lb

## 2016-04-10 DIAGNOSIS — E1165 Type 2 diabetes mellitus with hyperglycemia: Secondary | ICD-10-CM

## 2016-04-10 DIAGNOSIS — G4733 Obstructive sleep apnea (adult) (pediatric): Secondary | ICD-10-CM | POA: Insufficient documentation

## 2016-04-10 DIAGNOSIS — K219 Gastro-esophageal reflux disease without esophagitis: Secondary | ICD-10-CM | POA: Diagnosis not present

## 2016-04-10 DIAGNOSIS — F419 Anxiety disorder, unspecified: Secondary | ICD-10-CM

## 2016-04-10 DIAGNOSIS — G4700 Insomnia, unspecified: Secondary | ICD-10-CM | POA: Insufficient documentation

## 2016-04-10 DIAGNOSIS — M1990 Unspecified osteoarthritis, unspecified site: Secondary | ICD-10-CM

## 2016-04-10 LAB — COMPREHENSIVE METABOLIC PANEL
ALBUMIN: 4.3 g/dL (ref 3.5–5.2)
ALK PHOS: 67 U/L (ref 39–117)
ALT: 56 U/L — ABNORMAL HIGH (ref 0–53)
AST: 30 U/L (ref 0–37)
BUN: 14 mg/dL (ref 6–23)
CALCIUM: 8.8 mg/dL (ref 8.4–10.5)
CHLORIDE: 104 meq/L (ref 96–112)
CO2: 25 mEq/L (ref 19–32)
Creatinine, Ser: 0.85 mg/dL (ref 0.40–1.50)
GFR: 100.64 mL/min (ref >60.00)
Glucose, Bld: 112 mg/dL — ABNORMAL HIGH (ref 70–99)
POTASSIUM: 4.4 meq/L (ref 3.5–5.1)
Sodium: 137 mEq/L (ref 135–145)
TOTAL PROTEIN: 6.9 g/dL (ref 6.0–8.3)
Total Bilirubin: 0.5 mg/dL (ref 0.2–1.2)

## 2016-04-10 LAB — LIPID PANEL
CHOL/HDL RATIO: 4
CHOLESTEROL: 197 mg/dL (ref 0–200)
HDL: 48.5 mg/dL (ref >39.00)
NonHDL: 148.54
TRIGLYCERIDES: 270 mg/dL — AB (ref 0.0–149.0)
VLDL: 54 mg/dL — AB (ref 0.0–40.0)

## 2016-04-10 LAB — HEMOGLOBIN A1C: Hgb A1c MFr Bld: 6.5 % (ref 4.6–6.5)

## 2016-04-10 LAB — LDL CHOLESTEROL, DIRECT: LDL DIRECT: 96 mg/dL

## 2016-04-10 MED ORDER — METFORMIN HCL 1000 MG PO TABS: ORAL_TABLET | ORAL | 2 refills | Status: DC

## 2016-04-10 MED ORDER — AMITRIPTYLINE HCL 100 MG PO TABS: ORAL_TABLET | ORAL | 1 refills | Status: DC

## 2016-04-10 MED ORDER — TRAMADOL HCL 50 MG PO TABS: 50.0000 mg | ORAL_TABLET | Freq: Two times a day (BID) | ORAL | 2 refills | Status: DC | PRN

## 2016-04-10 MED ORDER — OMEPRAZOLE 20 MG PO CPDR: 20.0000 mg | DELAYED_RELEASE_CAPSULE | Freq: Every day | ORAL | 3 refills | Status: DC

## 2016-04-10 MED ORDER — CLONAZEPAM 2 MG PO TABS: ORAL_TABLET | ORAL | 2 refills | Status: DC

## 2016-04-10 NOTE — Patient Instructions (Addendum)
A few things to remember from today's visit:   Gastroesophageal reflux disease without esophagitis - Plan: omeprazole (PRILOSEC) 20 MG capsule  Primary osteoarthritis of left foot - Plan: traMADol (ULTRAM) 50 MG tablet  Type 2 diabetes mellitus with hyperglycemia, without long-term current use of insulin (HCC) - Plan: metFORMIN (GLUCOPHAGE) 1000 MG tablet, Lipid panel, Hemoglobin A1c, CMP, Microalbumin/Creatinine Ratio, Urine  Anxiety disorder, unspecified - Plan: clonazePAM (KLONOPIN) 2 MG tablet  Insomnia, unspecified - Plan: amitriptyline (ELAVIL) 100 MG tablet  Combination of amitriptyline and tramadol can increase her risk of seizures, aggravated depression, and Serotonin elevation with similar symptoms that can be life-threatening. For now no changes but in the future we might need to try other options for sleep.  Clonazepam to continue 1 tablet daily.  Pain is chronic and the goal with medication is to decrease pain to a level when you are able to function, pain will not be zero. Tramadol has some side effects: constipation, nausea, vomiting,sedation, decreased psychomotor function, urinary retention, addiction among some. Because this (as well as Clonazepam) are controlled medications you may undergo urine toxic screenings to verify the presence of medication in urine. Illegal substance are not tolerated when you are taking an opioid medication for pain management. Bring your medications with you for office visits. At some point if I feel like pain is not adequately controlled, I will recommend referral to pain specialists. Next vist medication contract to be signed.    HgA1C goal < 7.0. Avoid sugar added food:regular soft drinks, energy drinks, and sports drinks. candy. cakes. cookies. pies and cobblers. sweet rolls, pastries, and donuts. fruit drinks, such as fruitades and fruit punch. dairy desserts, such as ice cream  Mediterranean diet has showed benefits for sugar  control.  How much and what type of carbohydrate foods are important for managing diabetes. The balance between how much insulin is in your body and the carbohydrate you eat makes a difference in your blood glucose levels.  Fasting blood sugar ideally 130 or less, 2 hours after meals less than 180.   Regular exercise also will help with controlling disease, daily brisk walking as tolerated for 15-30 min definitively will help.   Avoid skipping meals, blood sugar might drop and cause serious problems. Remember checking feet periodically, good dental hygiene, and annual eye exam.     Avoid foods that make your symptoms worse, for example coffee, chocolate,pepermeint,alcohol, and greasy food. Raising the head of your bed about 6 inches may help with nocturnal symptoms.  Avoid tobacco use. Weight loss (if you are overweight). Avoid lying down for 3 hours after eating.  Instead 3 large meals daily try small and more frequent meals during the day.  Some medications we recommend for acid reflux treatment (proton pump inhibitors) can cause some problems in the long term: increase risk of osteoporosis, vitamin deficiencies,pneumonia, and more recently discovered that it can increase the risk of chronic kidney disease and might increase risk of dementia.       Please be sure medication list is accurate. If a new problem present, please set up appointment sooner than planned today.

## 2016-04-10 NOTE — Progress Notes (Signed)
Pre visit review using our clinic review tool, if applicable. No additional management support is needed unless otherwise documented below in the visit note. 

## 2016-04-10 NOTE — Progress Notes (Signed)
HPI:   [name] is a 50 y.o. male, who is here today to establish care with me.  Former PCP: Dr Matthew [name]. Last preventive routine visit: 2015  Concerns today: medication refills.  Chronic pain:  Mainly left foot after surgery a few years ago. Also intermittent knee pain.  Currently he is on Tramadol 50 mg, which he takes once or twice daily. He denies any side effect, medication helps with pain. He denies any illicit drug use, reviewing records he has Hx of marijuana use.  He denies side effects from medication.   Diabetes Mellitus II:    Currently on Metformin 1000 mg bid. Problem has been stable. He is not following dietary recommendations. Exercises regularly, states that he walks daily.  Checking BS's : No Hypoglycemia: Denies  He is tolerating medications well. He denies abdominal pain, nausea, vomiting, polydipsia, polyuria, or polyphagia. No numbness, tingling, or burning.  Last eye exam 2 years ago.   Lab Results  Component Value Date   CREATININE 1.36 09/17/2014   BUN 19 09/17/2014   NA 140 09/17/2014   K 4.4 09/17/2014   CL 102 09/17/2014   CO2 28 09/17/2014    Lab Results  Component Value Date   HGBA1C 8.7 (H) 07/16/2015   Lab Results  Component Value Date   MICROALBUR 0.6 05/25/2014     Anxiety:  Irritable, angry, "temper" issues. According to pt, he was referred to psychiatrists but he did not feel it was necessary, mediation seem to be helping.  He has not taken SSRI or SNRI medications in the past. He tells me he is not interested in changing medications.  Currently Clonazepam 2 mg daily, last Rx 07/2015 # 180.  Medication side effects: Denies  Insomnia/sleep disorder: Yes Suicidal thoughts/ideation: Denies. He denies Hx of depression but listed on problem list.  Lives with mother, who has Hx of depression. Denies any Hx of hospitalization due psychiatric disorder or bipolar disorder.  Current symptoms:  denies any.  Insomnia: Currently he is on amitriptyline 100 mg daily at bedtime. He works 3rd shift, sleeps about 5 hours. He's been on medication for many years, he denies any side effect.  Hx of OSA, Dx in 2013, CPAP recommended but he is not wearing it and not interested in referral to sleep specialists.  + Smoker, states that he does not smoke daily.  GERD: Hx of hiatal hernia. For about 2 years he has been on Omeprazole 20 mg daily. Sometimes he forgets to take medication, no symptoms records. He denies any side effect of medication.   When asked about alcohol intake, he states that he drinks liquor, "little" amount monthly.    Review of Systems  Constitutional: Positive for fatigue (No more than usual). Negative for appetite change, diaphoresis, fever and unexpected weight change.  HENT: Negative for dental problem, nosebleeds and sore throat.   Eyes: Negative for pain, redness and visual disturbance.  Respiratory: Positive for apnea (Hx of OSA, does not wear CPaP). Negative for cough, shortness of breath and wheezing.   Cardiovascular: Negative for chest pain, palpitations and leg swelling.  Gastrointestinal: Negative for abdominal pain, nausea and vomiting.       No changes in bowel habits.  Endocrine: Negative for polydipsia, polyphagia and polyuria.  Genitourinary: Negative for decreased urine volume, dysuria and hematuria.  Musculoskeletal: Positive for arthralgias. Negative for gait problem and myalgias.  Skin: Negative for rash and wound.  Neurological: Negative for tremors, seizures, syncope, weakness  and headaches.  Hematological: Does not bruise/bleed easily.  Psychiatric/Behavioral: Positive for sleep disturbance. Negative for confusion, hallucinations and suicidal ideas. The patient is nervous/anxious.       Current Outpatient Prescriptions on File Prior to Visit  Medication Sig Dispense Refill  . aspirin 81 MG tablet Take 81 mg by mouth daily.     No  current facility-administered medications on file prior to visit.      Past Medical History:  Diagnosis Date  . Anxiety   . Depression   . Diabetes mellitus   . GERD (gastroesophageal reflux disease)   . H/O hiatal hernia    ?  Marland Kitchen Mental disorder   . OSA (obstructive sleep apnea)    Not wearing CPAP  . Parotid mass    rt  . Sleep apnea    just started on cpap 2 weeks  . Stones in the urinary tract    No Known Allergies  Family History  Problem Relation Age of Onset  . Cancer Mother     breast  . Mental illness Mother   . Diabetes Father   . Stroke Brother     Social History   Social History  . Marital status: Single    Spouse name: N/A  . Number of children: N/A  . Years of education: N/A   Social History Main Topics  . Smoking status: Current Some Day Smoker    Packs/day: 0.25    Years: 5.00    Types: Cigarettes    Last attempt to quit: 03/28/2012  . Smokeless tobacco: Former Systems developer     Comment: marijuana 06/26/12  occ social drinker  . Alcohol use Yes     Comment: monthly  . Drug use:     Frequency: 1.0 time per week    Types: Marijuana  . Sexual activity: Not Asked   Other Topics Concern  . None   Social History Narrative  . None    Vitals:   04/10/16 1305  BP: 118/80  Pulse: 78  Resp: 12  Temp: 98 F (36.7 C)   O2 sat at RA 98%.  Body mass index is 38.55 kg/m.     Physical Exam  Nursing note and vitals reviewed. Constitutional: He is oriented to person, place, and time. He appears well-developed. No distress.  HENT:  Head: Atraumatic.  Mouth/Throat: Oropharynx is clear and moist and mucous membranes are normal.  Eyes: Conjunctivae and EOM are normal. Pupils are equal, round, and reactive to light.  Neck: No thyroid mass and no thyromegaly present.  Cardiovascular: Normal rate and regular rhythm.   No murmur heard. Pulses:      Dorsalis pedis pulses are 2+ on the right side, and 2+ on the left side.  Respiratory: Effort normal  and breath sounds normal. No respiratory distress.  GI: Soft. He exhibits no mass. There is no hepatomegaly. There is no tenderness.  Musculoskeletal: He exhibits edema (Trace pitting edema LE bilateral).  Lymphadenopathy:    He has no cervical adenopathy.  Neurological: He is alert and oriented to person, place, and time. He has normal strength. Coordination and gait normal.  Skin: Skin is warm. No erythema.  Psychiatric: His speech is normal. His mood appears anxious.  Well groomed, good eye contact.   Diabetic foot exam:  Monofilament normal bilateral. Peripheral pulses present (DP). No calluses + hypertrophic/long toenails.    ASSESSMENT AND PLAN:     Dyvon was seen today for new patient (initial visit).  Diagnoses and all  orders for this visit:  Osteoarthritis, unspecified osteoarthritis type, unspecified site  Stable. We discussed some side effects of Tramadol and risk of interaction with some of his meds.  He will continue 1-2 tabs daily. Medication contract to be signed next OV. F/U in 3 months.   -     traMADol (ULTRAM) 50 MG tablet; Take 1 tablet (50 mg total) by mouth every 12 (twelve) hours as needed for moderate pain.  Type 2 diabetes mellitus with hyperglycemia, without long-term current use of insulin (HCC)   HgA1C was not at goal. No changes in current management, will adjust depending on lab results. Regular exercise and healthy diet with avoidance of added sugar food intake is an important part of treatment and recommended. Annual eye exam, periodic dental and foot care recommended. F/U in 3-4 months  -     metFORMIN (GLUCOPHAGE) 1000 MG tablet; TAKE 1 TABLET BY MOUTH TWICE A DAY WITH A MEAL -     Lipid panel -     Hemoglobin A1c -     CMP -     Microalbumin/Creatinine Ratio, Urine  Gastroesophageal reflux disease without esophagitis  Well controlled. No changes in PPI, GERD precautions to continue. Wt loss may also help.  F/U in 6-12  months.   -     omeprazole (PRILOSEC) 20 MG capsule; Take 1 capsule (20 mg total) by mouth daily.  Anxiety disorder, unspecified  Stable. Not interested in changing management, adding SSRI, or psychotherapy. For now changes in current management. F/U in 3 months. Medication contract to be signed then.  -     clonazePAM (KLONOPIN) 2 MG tablet; TAKE 1 TABLET  A DAY for ANXIETY  Insomnia, unspecified  Stable. No changes in current management. Good sleep hygiene.  F/U in 6  months.  -     amitriptyline (ELAVIL) 100 MG tablet; TAKE 1 TABLET (100 MG TOTAL) BY MOUTH DAILY at bedtime.  OSA (obstructive sleep apnea)  Adverse effects of OSA discussed. He is not interested in wearing CPAP. Wt loss may help.           Darrelle Barrell G. [place], MD  [facility]. [place] office.

## 2016-04-20 ENCOUNTER — Other Ambulatory Visit: Payer: Self-pay

## 2016-04-20 MED ORDER — ROSUVASTATIN CALCIUM 10 MG PO TABS: 10.0000 mg | ORAL_TABLET | Freq: Every day | ORAL | 0 refills | Status: DC

## 2016-07-03 ENCOUNTER — Ambulatory Visit: Payer: 59 | Admitting: Family Medicine

## 2016-07-10 ENCOUNTER — Ambulatory Visit (INDEPENDENT_AMBULATORY_CARE_PROVIDER_SITE_OTHER): Payer: 59 | Admitting: Family Medicine

## 2016-07-10 ENCOUNTER — Encounter: Payer: Self-pay | Admitting: Family Medicine

## 2016-07-10 ENCOUNTER — Encounter: Payer: Self-pay | Admitting: Emergency Medicine

## 2016-07-10 ENCOUNTER — Encounter: Payer: Self-pay | Admitting: *Deleted

## 2016-07-10 VITALS — BP 120/88 | HR 104 | Temp 98.8°F | Resp 12 | Ht 71.0 in | Wt 275.6 lb

## 2016-07-10 DIAGNOSIS — G4700 Insomnia, unspecified: Secondary | ICD-10-CM | POA: Diagnosis not present

## 2016-07-10 DIAGNOSIS — Z6838 Body mass index (BMI) 38.0-38.9, adult: Secondary | ICD-10-CM | POA: Diagnosis not present

## 2016-07-10 DIAGNOSIS — F419 Anxiety disorder, unspecified: Secondary | ICD-10-CM

## 2016-07-10 DIAGNOSIS — M1990 Unspecified osteoarthritis, unspecified site: Secondary | ICD-10-CM | POA: Diagnosis not present

## 2016-07-10 NOTE — Patient Instructions (Addendum)
A few things to remember from today's visit:   Osteoarthritis, unspecified osteoarthritis type, unspecified site  Anxiety disorder, unspecified type  Insomnia, unspecified type  No changes today. Reconsider Flu shot.  Medication contract signed today.  Congratulations for quitting smoking.   Please be sure medication list is accurate. If a new problem present, please set up appointment sooner than planned today.

## 2016-07-10 NOTE — Progress Notes (Signed)
HPI:   Matthew [name] is a 50 y.o. male, who is here today to follow on some of his chronic medical problems.  I last saw him 04/10/2016, at that time I agree to continue managing his chronic pain and anxiety.   Chronic pain: Mainly left foot and knee pain, s/p foot surgery. Currently he is on Tramadol 50 mg, which in general he takes once daily, pain is mild while he takes medication. Exacerbated by prolonged standing/wolking. Alleviated by rest.  He denies any side effect. Medication is still helping with pain, she denies limitation on his daily activities.   Anxiety: Currently he is on Clonazepam, which was decreased from 2 mg daily to 1 mg daily. He also has history of insomnia, he takes amitriptyline 100 mg daily, which is helping. Problem is stable, medication still helps, denies suicidal thoughts.  He sleeps about 8-10 hours, sometimes due to schedule of work he cannot sleep more than 5 hours.   He quit smoking about 38 days ago.  He is trying to follow a healthy diet, he doesn't exercise regularly.   Review of Systems  Constitutional: Positive for fatigue (No more than usual). Negative for appetite change, diaphoresis and fever.  HENT: Negative for mouth sores, nosebleeds, sore throat and trouble swallowing.   Respiratory: Positive for apnea (Hx of OSA, does not wear CPaP). Negative for cough, shortness of breath and wheezing.   Cardiovascular: Negative for chest pain, palpitations and leg swelling.  Gastrointestinal: Negative for abdominal pain, nausea and vomiting.       No changes in bowel habits.  Endocrine: Negative for polydipsia, polyphagia and polyuria.  Genitourinary: Negative for decreased urine volume, dysuria and hematuria.  Musculoskeletal: Positive for arthralgias. Negative for gait problem.  Neurological: Negative for tremors, syncope, weakness and headaches.  Psychiatric/Behavioral: Positive for sleep disturbance. Negative for confusion,  hallucinations and suicidal ideas. The patient is nervous/anxious.       Current Outpatient Prescriptions on File Prior to Visit  Medication Sig Dispense Refill  . amitriptyline (ELAVIL) 100 MG tablet TAKE 1 TABLET (100 MG TOTAL) BY MOUTH DAILY at bedtime. 90 tablet 1  . aspirin 81 MG tablet Take 81 mg by mouth daily.    . clonazePAM (KLONOPIN) 2 MG tablet TAKE 1 TABLET  A DAY for ANXIETY 30 tablet 2  . metFORMIN (GLUCOPHAGE) 1000 MG tablet TAKE 1 TABLET BY MOUTH TWICE A DAY WITH A MEAL 180 tablet 2  . omeprazole (PRILOSEC) 20 MG capsule Take 1 capsule (20 mg total) by mouth daily. 90 capsule 3  . rosuvastatin (CRESTOR) 10 MG tablet Take 1 tablet (10 mg total) by mouth daily. 90 tablet 0  . traMADol (ULTRAM) 50 MG tablet Take 1 tablet (50 mg total) by mouth every 12 (twelve) hours as needed for moderate pain. 60 tablet 2   No current facility-administered medications on file prior to visit.      Past Medical History:  Diagnosis Date  . Anxiety   . Depression   . Diabetes mellitus   . GERD (gastroesophageal reflux disease)   . H/O hiatal hernia    ?  Marland Kitchen Mental disorder   . OSA (obstructive sleep apnea)    Not wearing CPAP  . Parotid mass    rt  . Sleep apnea    just started on cpap 2 weeks  . Stones in the urinary tract    No Known Allergies  Social History   Social History  . Marital  status: Single    Spouse name: N/A  . Number of children: N/A  . Years of education: N/A   Social History Main Topics  . Smoking status: Former Smoker    Packs/day: 0.25    Years: 5.00    Types: Cigarettes    Quit date: 05/28/2016  . Smokeless tobacco: Former Systems developer     Comment: marijuana 06/26/12  occ social drinker  . Alcohol use Yes     Comment: monthly  . Drug use:     Frequency: 1.0 time per week    Types: Marijuana     Comment: denies recent use  . Sexual activity: Not Asked   Other Topics Concern  . None   Social History Narrative  . None    Vitals:   07/10/16  0943  BP: 120/88  Pulse: (!) 104  Resp: 12  Temp: 98.8 F (37.1 C)   Body mass index is 38.44 kg/m.  Wt Readings from Last 3 Encounters:  07/10/16 275 lb 9.6 oz (125 kg)  04/10/16 276 lb 6 oz (125.4 kg)  07/16/15 291 lb (132 kg)      Physical Exam  Nursing note and vitals reviewed. Constitutional: He is oriented to person, place, and time. He appears well-developed. No distress.  HENT:  Head: Atraumatic.  Mouth/Throat: Oropharynx is clear and moist and mucous membranes are normal.  Eyes: Conjunctivae and EOM are normal. Pupils are equal, round, and reactive to light.  Cardiovascular: Regular rhythm.  Tachycardia present.   No murmur heard. Pulses:      Dorsalis pedis pulses are 2+ on the right side, and 2+ on the left side.  Respiratory: Effort normal and breath sounds normal. No respiratory distress.  GI: Soft. He exhibits no mass. There is no hepatomegaly. There is no tenderness.  Musculoskeletal: He exhibits edema (LE bilateral, trace pitting.). He exhibits no tenderness.  Mild knee crepitus, no effusion , no limitation ROM. Non antalgic gait.  Neurological: He is alert and oriented to person, place, and time. He has normal strength. Coordination and gait normal.  Skin: Skin is warm. No erythema.  Psychiatric: He has a normal mood and affect. His speech is normal.  Well groomed, good eye contact.      ASSESSMENT AND PLAN:     Matthew [name] was seen today for follow-up.  Diagnoses and all orders for this visit:   Osteoarthritis, unspecified osteoarthritis type, unspecified site  Pain seems to be well controlled on current regimen, no changes in Tramadol. Some side effects discussed. Wt loss may help. F/U in 3-4 months.  Anxiety disorder, unspecified type  Stable. No changes in Clonazepam for now. Tolerating well. F/U in 3-4 months.   Insomnia, unspecified type  Good sleep hygiene. No changes in Amitriptyline. F/U in 3-4 months.  BMI  38.0-38.9,adult  Wt otherwise stable. Encouraged to follow a healthier diet and engaging in regular physical activity. Some adverse effects of obesity were discussed.   He states that he does not need refills today, instructed to call 2-3 days in advance when he needs Tramadol or Clonazepam. Medication contract signed today. Congratulated for smoking cessation and encouraged to continue tobacco free.    -Matthew [name] was advised to return sooner than planned today if new concerns arise.       Betty G. Martinique, MD  [facility]. [place] office.

## 2016-07-10 NOTE — Progress Notes (Signed)
Pre visit review using our clinic review tool, if applicable. No additional management support is needed unless otherwise documented below in the visit note. 

## 2016-07-18 ENCOUNTER — Other Ambulatory Visit: Payer: Self-pay | Admitting: Family Medicine

## 2016-07-29 ENCOUNTER — Other Ambulatory Visit: Payer: Self-pay | Admitting: Family Medicine

## 2016-07-29 DIAGNOSIS — F419 Anxiety disorder, unspecified: Secondary | ICD-10-CM

## 2016-07-29 DIAGNOSIS — M1990 Unspecified osteoarthritis, unspecified site: Secondary | ICD-10-CM

## 2016-07-31 NOTE — Telephone Encounter (Signed)
Rx's faxed.

## 2016-07-31 NOTE — Telephone Encounter (Signed)
Okay to refill? 

## 2016-09-21 ENCOUNTER — Other Ambulatory Visit: Payer: Self-pay | Admitting: Family Medicine

## 2016-09-21 DIAGNOSIS — G4700 Insomnia, unspecified: Secondary | ICD-10-CM

## 2016-11-03 ENCOUNTER — Encounter: Payer: Self-pay | Admitting: Family Medicine

## 2016-11-03 ENCOUNTER — Ambulatory Visit (INDEPENDENT_AMBULATORY_CARE_PROVIDER_SITE_OTHER): Payer: 59 | Admitting: Family Medicine

## 2016-11-03 VITALS — BP 140/90 | HR 103 | Resp 12 | Ht 71.0 in | Wt 286.0 lb

## 2016-11-03 DIAGNOSIS — L02511 Cutaneous abscess of right hand: Secondary | ICD-10-CM | POA: Diagnosis not present

## 2016-11-03 MED ORDER — SULFAMETHOXAZOLE-TRIMETHOPRIM 800-160 MG PO TABS: 1.0000 | ORAL_TABLET | Freq: Two times a day (BID) | ORAL | 0 refills | Status: DC

## 2016-11-03 NOTE — Patient Instructions (Signed)
A few things to remember from today's visit:   Finger pulp abscess, right - Plan: sulfamethoxazole-trimethoprim (BACTRIM DS,SEPTRA DS) 800-160 MG tablet  Elevation,soak finger in warm water, if not greatly improved in 3 days please follow.  Please be sure medication list is accurate. If a new problem present, please set up appointment sooner than planned today.

## 2016-11-03 NOTE — Progress Notes (Signed)
Pre visit review using our clinic review tool, if applicable. No additional management support is needed unless otherwise documented below in the visit note. 

## 2016-11-03 NOTE — Progress Notes (Signed)
HPI:   ACUTE VISIT:  No chief complaint on file.   Mr.Westley OSMIN WELZ is a 51 y.o. male, who is here today complaining of edema,erythema,and severe pain on tip of right 4th finger. He is not sure about injury but he thinks he injured or cut finger while he was washing dishes 5 days ago, no problem noted at that time. 4 days ago when he woke up he noted edema,erythema, and pain; all getting worse. Throbbing ,severe pain,constant, radiated to forearm. Pain is aggravated by movement and palpation, no alleviating factors. No numbness or tingling,no cyanosis or cold extremity. "Metal" flavor in mouth,not sure if associated to this problem.No odynophagia or dysphagia.   He has Hx of chronic pain and in [place] doe snot help much. Also taking OTC NSAID's. He has not tried OTC topical abx.  Hx of DM II, has not checked BS's.  Review of Systems  Constitutional: Positive for fatigue. Negative for appetite change, chills and fever.  HENT: Negative for mouth sores, sore throat, trouble swallowing and voice change.   Respiratory: Negative for shortness of breath and wheezing.   Cardiovascular: Negative for chest pain and leg swelling.  Gastrointestinal: Negative for nausea and vomiting.  Musculoskeletal: Positive for arthralgias. Negative for myalgias.  Skin: Negative for rash and wound.  Neurological: Negative for syncope, weakness and headaches.  Hematological: Negative for adenopathy. Does not bruise/bleed easily.  Psychiatric/Behavioral: Positive for sleep disturbance. Negative for confusion. The patient is nervous/anxious.       Current Outpatient Prescriptions on File Prior to Visit  Medication Sig Dispense Refill  . amitriptyline (ELAVIL) 100 MG tablet TAKE 1 TABLET BY MOUTH AT BEDTIME 90 tablet 0  . aspirin 81 MG tablet Take 81 mg by mouth daily.    . clonazePAM (KLONOPIN) 2 MG tablet TAKE 1 TABLET BY MOUTH EVERY DAY FOR ANXIETY 30 tablet 3  . metFORMIN (GLUCOPHAGE)  1000 MG tablet TAKE 1 TABLET BY MOUTH TWICE A DAY WITH A MEAL 180 tablet 2  . omeprazole (PRILOSEC) 20 MG capsule Take 1 capsule (20 mg total) by mouth daily. 90 capsule 3  . rosuvastatin (CRESTOR) 10 MG tablet TAKE 1 TABLET BY MOUTH DAILY 90 tablet 1  . traMADol (ULTRAM) 50 MG tablet TAKE 1 TABLET BY MOUTH EVERY 12 HOURS AS NEEDED FOR MODERATE PAIN 60 tablet 3   No current facility-administered medications on file prior to visit.      Past Medical History:  Diagnosis Date  . Anxiety   . Depression   . Diabetes mellitus   . GERD (gastroesophageal reflux disease)   . H/O hiatal hernia    ?  Marland Kitchen Mental disorder   . OSA (obstructive sleep apnea)    Not wearing CPAP  . Parotid mass    rt  . Sleep apnea    just started on cpap 2 weeks  . Stones in the urinary tract    No Known Allergies  Social History   Social History  . Marital status: Single    Spouse name: N/A  . Number of children: N/A  . Years of education: N/A   Social History Main Topics  . Smoking status: Former Smoker    Packs/day: 0.25    Years: 5.00    Types: Cigarettes    Quit date: 05/28/2016  . Smokeless tobacco: Former Systems developer     Comment: marijuana 06/26/12  occ social drinker  . Alcohol use Yes     Comment: monthly  . Drug  use: Yes    Frequency: 1.0 time per week    Types: Marijuana     Comment: denies recent use  . Sexual activity: Not Asked   Other Topics Concern  . None   Social History Narrative  . None    Vitals:   11/03/16 0820  BP: 140/90  Pulse: (!) 103  Resp: 12   Body mass index is 39.89 kg/m.   Physical Exam  Nursing note and vitals reviewed. Constitutional: He is oriented to person, place, and time. He appears well-developed. He does not appear ill. No distress.  HENT:  Head: Atraumatic.  Mouth/Throat: Oropharynx is clear and moist. Mucous membranes are dry. Abnormal dentition.  Eyes: Conjunctivae and EOM are normal.  Cardiovascular: Regular rhythm.  Tachycardia present.    Pulses:      Radial pulses are 2+ on the right side.  Respiratory: Effort normal and breath sounds normal. No respiratory distress.  Musculoskeletal:  No signs of synovitis. Right MCP and wrist normal ROM. Right 4th finger: DIP limitation ROM due to pain.  Lymphadenopathy:    He has no cervical adenopathy.  Neurological: He is alert and oriented to person, place, and time. He has normal strength. Coordination and gait normal.  Skin: Skin is warm. No rash noted. There is erythema.  Periungual and finger tip edema,erythema, and local heat. Very tender with palpation.Fluctuant area on radial aspect of finger tip,no active drainage or wound.  Psychiatric: He has a normal mood and affect.  Well groomed, good eye contact.      ASSESSMENT AND PLAN:   Diagnoses and all orders for this visit:  Finger pulp abscess, right -     sulfamethoxazole-trimethoprim (BACTRIM DS,SEPTRA DS) 800-160 MG tablet; Take 1 tablet by mouth 2 (two) times daily. -     WOUND CULTURE  After verbal consent nerve block and abscess on 4th right finger was aspirated. Area was cleaned with alcohol and nerve block done with Lidocaine 1% 3 cc total.  Finger tip cleaned with soup and water then with alcohol, using a 3 cc syringe and 18 gouge needle abscess was aspirated. About 8 mm of purulent material was obtained and culture was sent.  He tolerated procedure well. Empiric treatment with Bactrim started, will tailor according to Cx. Elevation,keep area clean with soap and water. Soak finger in warm water a few times per day. Clearly instructed about warning signs. No changes in Tramadol. F/U in 7 days if not resolved,before if worsening symptoms.    -Matthew [name] was advised to return or notify a doctor immediately if symptoms worsen or persist or new concerns arise.       Jaquavius Hudler G. Martinique, MD  [facility]. [place] office.

## 2016-11-16 ENCOUNTER — Other Ambulatory Visit: Payer: Self-pay | Admitting: Family Medicine

## 2016-11-16 DIAGNOSIS — L02511 Cutaneous abscess of right hand: Secondary | ICD-10-CM

## 2017-01-03 ENCOUNTER — Other Ambulatory Visit: Payer: Self-pay | Admitting: Family Medicine

## 2017-01-03 DIAGNOSIS — M1990 Unspecified osteoarthritis, unspecified site: Secondary | ICD-10-CM

## 2017-01-03 DIAGNOSIS — F419 Anxiety disorder, unspecified: Secondary | ICD-10-CM

## 2017-01-04 NOTE — Telephone Encounter (Signed)
Last refill for both medications: 07/31/16 with 3 refills each.

## 2017-01-05 NOTE — Telephone Encounter (Signed)
Last f/u appt for chronic pain and anxiety was in 06/2016, 3-4 months f/u recommended.  He was seen for an acute visit in 10/2016. He needs f/u appt.  Thanks, BJ

## 2017-01-30 DIAGNOSIS — G894 Chronic pain syndrome: Secondary | ICD-10-CM | POA: Insufficient documentation

## 2017-01-30 DIAGNOSIS — R7401 Elevation of levels of liver transaminase levels: Secondary | ICD-10-CM | POA: Insufficient documentation

## 2017-01-30 DIAGNOSIS — R740 Nonspecific elevation of levels of transaminase and lactic acid dehydrogenase [LDH]: Secondary | ICD-10-CM

## 2017-01-30 NOTE — Progress Notes (Signed)
HPI:   Matthew [name] is a 51 y.o. male, who is here today to follow on some chronic medical problems.  He was last seen on 11/03/16 for acute visit. Last f/u appt on 07/10/16.  Chronic pain: Mainly left foot pain, residual pain after surgery.  Also knee pain intermittently. He is currently on Tramadol 50 mg bid prn. Pain level: 7/10 and constant ankle pain, 10/10 right knee, which he thinks he injured recently at work, no direct trauma, pulling sensation.He do  Pain is worse with walking and prolonged standing. Alleviated some by rest.   Anxiety: He is also on Clonazepam 2 mg daily as needed. Hx of depression. He is not been interested in daily SSRI.  Denies side effects from medication and still helps with anxiety.   Insomnia: He takes Amitriptyline 100 mg at bedtime. Sleeps about 6 hours. He works 3rd shift. Hx of OSA, he is not wearing CPAP machine.   Diabetes Mellitus II:   Currently on Metformin 1000 mg bid. Last eye exam: Over 2 years ago. Checking BS's : Not checking. Hypoglycemia:Denies.  He denies polydipsia, polyuria, or polyphagia. No numbness, tingling, or burning.    Lab Results  Component Value Date   CREATININE 0.85 04/10/2016   BUN 14 04/10/2016   NA 137 04/10/2016   K 4.4 04/10/2016   CL 104 04/10/2016   CO2 25 04/10/2016    Lab Results  Component Value Date   HGBA1C 6.5 04/10/2016   Lab Results  Component Value Date   MICROALBUR 0.6 05/25/2014   HLD: He is on Crestor 10 mg daily.  Compliance with los fat diet: Yes.  Lab Results  Component Value Date   CHOL 197 04/10/2016   HDL 48.50 04/10/2016   LDLCALC 81 01/09/2014   LDLDIRECT 96.0 04/10/2016   TRIG 270.0 (H) 04/10/2016   CHOLHDL 4 04/10/2016   Tolerating medication well.  GERD: Omeprazole 20 mg has helped with heartburn.  Denies abdominal pain, nausea, vomiting, changes in bowel habits, blood in stool or melena.   He does not exercise regularly but  has an active job. He thinks he is doing fine with his diet,has noted some wt loss.  He has no new concerns today.   Review of Systems  Constitutional: Negative for activity change, appetite change, fatigue, fever and unexpected weight change.  HENT: Negative for dental problem, mouth sores, nosebleeds, sore throat and trouble swallowing.   Eyes: Negative for redness and visual disturbance.  Respiratory: Negative for shortness of breath and wheezing.   Cardiovascular: Negative for chest pain, palpitations and leg swelling.  Gastrointestinal: Negative for abdominal pain, nausea and vomiting.       No changes in bowel habits.  Endocrine: Negative for polydipsia, polyphagia and polyuria.  Genitourinary: Negative for decreased urine volume and hematuria.  Musculoskeletal: Positive for arthralgias. Negative for gait problem and myalgias.  Skin: Negative for rash and wound.  Neurological: Negative for syncope, weakness and headaches.  Psychiatric/Behavioral: Positive for sleep disturbance. Negative for confusion and suicidal ideas. The patient is nervous/anxious.       Current Outpatient Prescriptions on File Prior to Visit  Medication Sig Dispense Refill  . aspirin 81 MG tablet Take 81 mg by mouth daily.    . metFORMIN (GLUCOPHAGE) 1000 MG tablet TAKE 1 TABLET BY MOUTH TWICE A DAY WITH A MEAL 180 tablet 2  . rosuvastatin (CRESTOR) 10 MG tablet TAKE 1 TABLET BY MOUTH DAILY 90 tablet 1   No  current facility-administered medications on file prior to visit.      Past Medical History:  Diagnosis Date  . Anxiety   . Depression   . Diabetes mellitus   . GERD (gastroesophageal reflux disease)   . H/O hiatal hernia    ?  Marland Kitchen Mental disorder   . OSA (obstructive sleep apnea)    Not wearing CPAP  . Parotid mass    rt  . Sleep apnea    just started on cpap 2 weeks  . Stones in the urinary tract    No Known Allergies  Social History   Social History  . Marital status: Single     Spouse name: N/A  . Number of children: N/A  . Years of education: N/A   Social History Main Topics  . Smoking status: Former Smoker    Packs/day: 0.25    Years: 5.00    Types: Cigarettes    Quit date: 05/28/2016  . Smokeless tobacco: Former Systems developer     Comment: marijuana 06/26/12  occ social drinker  . Alcohol use Yes     Comment: monthly  . Drug use: Yes    Frequency: 1.0 time per week    Types: Marijuana     Comment: denies recent use  . Sexual activity: Not Asked   Other Topics Concern  . None   Social History Narrative  . None    Vitals:   01/31/17 0723 01/31/17 0803  BP: 120/90 110/80  Pulse: (!) 106 (!) 104  Resp: 12    Body mass index is 38.91 kg/m.  Wt Readings from Last 3 Encounters:  01/31/17 279 lb (126.6 kg)  11/03/16 286 lb (129.7 kg)  07/10/16 275 lb 9.6 oz (125 kg)     Physical Exam  Nursing note and vitals reviewed. Constitutional: He is oriented to person, place, and time. He appears well-developed. No distress.  HENT:  Head: Atraumatic.  Mouth/Throat: Oropharynx is clear and moist and mucous membranes are normal.  Eyes: Conjunctivae and EOM are normal. Pupils are equal, round, and reactive to light.  Cardiovascular: Regular rhythm.  Tachycardia present.   No murmur heard. Pulses:      Dorsalis pedis pulses are 2+ on the right side, and 2+ on the left side.  Respiratory: Effort normal and breath sounds normal. No respiratory distress.  GI: Soft. He exhibits no mass. There is no hepatomegaly. There is no tenderness.  Musculoskeletal: He exhibits no edema.       Right knee: He exhibits decreased range of motion. Tenderness found. Medial joint line tenderness noted.       Left ankle: He exhibits decreased range of motion (Mild.).  Knee crepitus R>L, bilateral.  Lymphadenopathy:    He has no cervical adenopathy.  Neurological: He is alert and oriented to person, place, and time. He has normal strength. Gait normal.  Skin: Skin is warm. No rash  noted. No erythema.  Psychiatric: His mood appears anxious.  Appropriately groomed, good eye contact.   Diabetic foot exam:  Monofilament normal bilateral. Peripheral pulses present (DP). + calluses + hypertrophic/long toenails.    ASSESSMENT AND PLAN:   Matthew [name] was seen today for medication refills.  Diagnoses and all orders for this visit:   Lab Results  Component Value Date   HGBA1C 7.0 (H) 01/31/2017   Lab Results  Component Value Date   CHOL 116 01/31/2017   HDL 52.20 01/31/2017   LDLCALC 40 01/31/2017   LDLDIRECT 96.0 04/10/2016   TRIG 122.0  01/31/2017   CHOLHDL 2 01/31/2017     Chemistry      Component Value Date/Time   NA 137 01/31/2017 0822   K 4.5 01/31/2017 0822   CL 101 01/31/2017 0822   CO2 27 01/31/2017 0822   BUN 15 01/31/2017 0822   CREATININE 0.89 01/31/2017 0822      Component Value Date/Time   CALCIUM 9.9 01/31/2017 0822   ALKPHOS 70 01/31/2017 0822   AST 29 01/31/2017 0822   ALT 39 01/31/2017 0822   BILITOT 0.4 01/31/2017 0822      Osteoarthritis, unspecified osteoarthritis type, unspecified site  Still c/o moderate intensity pain, he agrees with trying Mobic. Some side effects discussed. No changes in Tramadol. F/U in 3-4 months.  -     traMADol (ULTRAM) 50 MG tablet; TAKE 1 TABLET BY MOUTH EVERY 12 HOURS AS NEEDED FOR MODERATE PAIN -     meloxicam (MOBIC) 15 MG tablet; Take 1 tablet (15 mg total) by mouth daily.  Type 2 diabetes mellitus without complication, without long-term current use of insulin (HCC)  HgA1C pending today, it has been at goal. No changes in current management. Regular exercise and healthy diet with avoidance of added sugar food intake is an important part of treatment and recommended. Annual eye exam, periodic dental and foot care recommended. F/U in 6 months  -     Lipid panel -     Comprehensive metabolic panel -     Hemoglobin A1c       -     Microalbumin / creatinine urine ratio   Insomnia,  unspecified type  Side effects discussed. Good sleep hygiene. No changes in current management. F/U in 6 months.  -     amitriptyline (ELAVIL) 100 MG tablet; Take 1 tablet (100 mg total) by mouth at bedtime.  Anxiety disorder, unspecified type  Stable symptoms. Side effects discussed. No changes in current management. F/U in 3-80months.  -     clonazePAM (KLONOPIN) 2 MG tablet; TAKE 1 TABLET BY MOUTH EVERY DAY FOR ANXIETY  Chronic pain disorder  Med contract signed today. [place] controlled sub web site reviewed,last refill for Tramadol on 11/24/16.  -     meloxicam (MOBIC) 15 MG tablet; Take 1 tablet (15 mg total) by mouth daily.  Elevated ALT measurement  Wt loss recommended. Low fat diet. Further recommendations will be given according to lab results.    Gastroesophageal reflux disease without esophagitis  Well controlled. GERD precautions discussed. No changes in current management. F/U in 12 months.  -     omeprazole (PRILOSEC) 20 MG capsule; Take 1 capsule (20 mg total) by mouth daily.     -Matthew [name] was advised to return sooner than planned today if new concerns arise.       Kashana Breach G. Martinique, MD  [facility]. [place] office.

## 2017-01-31 ENCOUNTER — Encounter: Payer: Self-pay | Admitting: Family Medicine

## 2017-01-31 ENCOUNTER — Ambulatory Visit (INDEPENDENT_AMBULATORY_CARE_PROVIDER_SITE_OTHER): Payer: 59 | Admitting: Family Medicine

## 2017-01-31 VITALS — BP 110/80 | HR 104 | Resp 12 | Ht 71.0 in | Wt 279.0 lb

## 2017-01-31 DIAGNOSIS — G4700 Insomnia, unspecified: Secondary | ICD-10-CM | POA: Diagnosis not present

## 2017-01-31 DIAGNOSIS — R7401 Elevation of levels of liver transaminase levels: Secondary | ICD-10-CM

## 2017-01-31 DIAGNOSIS — E119 Type 2 diabetes mellitus without complications: Secondary | ICD-10-CM

## 2017-01-31 DIAGNOSIS — K219 Gastro-esophageal reflux disease without esophagitis: Secondary | ICD-10-CM | POA: Diagnosis not present

## 2017-01-31 DIAGNOSIS — R740 Nonspecific elevation of levels of transaminase and lactic acid dehydrogenase [LDH]: Secondary | ICD-10-CM

## 2017-01-31 DIAGNOSIS — G894 Chronic pain syndrome: Secondary | ICD-10-CM

## 2017-01-31 DIAGNOSIS — F419 Anxiety disorder, unspecified: Secondary | ICD-10-CM

## 2017-01-31 DIAGNOSIS — M1990 Unspecified osteoarthritis, unspecified site: Secondary | ICD-10-CM | POA: Diagnosis not present

## 2017-01-31 LAB — COMPREHENSIVE METABOLIC PANEL
ALBUMIN: 4.6 g/dL (ref 3.5–5.2)
ALT: 39 U/L (ref 0–53)
AST: 29 U/L (ref 0–37)
Alkaline Phosphatase: 70 U/L (ref 39–117)
BILIRUBIN TOTAL: 0.4 mg/dL (ref 0.2–1.2)
BUN: 15 mg/dL (ref 6–23)
CHLORIDE: 101 meq/L (ref 96–112)
CO2: 27 meq/L (ref 19–32)
CREATININE: 0.89 mg/dL (ref 0.40–1.50)
Calcium: 9.9 mg/dL (ref 8.4–10.5)
GFR: 95.14 mL/min (ref >60.00)
Glucose, Bld: 100 mg/dL — ABNORMAL HIGH (ref 70–99)
Potassium: 4.5 mEq/L (ref 3.5–5.1)
SODIUM: 137 meq/L (ref 135–145)
Total Protein: 7 g/dL (ref 6.0–8.3)

## 2017-01-31 LAB — LIPID PANEL
CHOLESTEROL: 116 mg/dL (ref 0–200)
HDL: 52.2 mg/dL (ref >39.00)
LDL CALC: 40 mg/dL (ref 0–99)
NonHDL: 63.95
TRIGLYCERIDES: 122 mg/dL (ref 0.0–149.0)
Total CHOL/HDL Ratio: 2
VLDL: 24.4 mg/dL (ref 0.0–40.0)

## 2017-01-31 LAB — HEMOGLOBIN A1C: HEMOGLOBIN A1C: 7 % — AB (ref 4.6–6.5)

## 2017-01-31 MED ORDER — MELOXICAM 15 MG PO TABS: 15.0000 mg | ORAL_TABLET | Freq: Every day | ORAL | 2 refills | Status: DC

## 2017-01-31 MED ORDER — OMEPRAZOLE 20 MG PO CPDR: 20.0000 mg | DELAYED_RELEASE_CAPSULE | Freq: Every day | ORAL | 3 refills | Status: DC

## 2017-01-31 MED ORDER — CLONAZEPAM 2 MG PO TABS: ORAL_TABLET | ORAL | 3 refills | Status: DC

## 2017-01-31 MED ORDER — AMITRIPTYLINE HCL 100 MG PO TABS: 100.0000 mg | ORAL_TABLET | Freq: Every day | ORAL | 1 refills | Status: DC

## 2017-01-31 MED ORDER — TRAMADOL HCL 50 MG PO TABS: ORAL_TABLET | ORAL | 3 refills | Status: DC

## 2017-01-31 NOTE — Patient Instructions (Signed)
A few things to remember from today's visit:   Type 2 diabetes mellitus without complication, without long-term current use of insulin (HCC) - Plan: Lipid panel, Comprehensive metabolic panel, Hemoglobin A1c  Insomnia, unspecified type  Anxiety disorder, unspecified type - Plan: clonazePAM (KLONOPIN) 2 MG tablet  Osteoarthritis, unspecified osteoarthritis type, unspecified site - Plan: traMADol (ULTRAM) 50 MG tablet, meloxicam (MOBIC) 15 MG tablet  Chronic pain disorder - Plan: meloxicam (MOBIC) 15 MG tablet  Elevated ALT measurement - Plan: Microalbumin / creatinine urine ratio  Gastroesophageal reflux disease without esophagitis - Plan: omeprazole (PRILOSEC) 20 MG capsule  HgA1C goal < 7.0. Avoid sugar added food:regular soft drinks, energy drinks, and sports drinks. candy. cakes. cookies. pies and cobblers. sweet rolls, pastries, and donuts. fruit drinks, such as fruitades and fruit punch. dairy desserts, such as ice cream  Mediterranean diet has showed benefits for sugar control.  How much and what type of carbohydrate foods are important for managing diabetes. The balance between how much insulin is in your body and the carbohydrate you eat makes a difference in your blood glucose levels.  Fasting blood sugar ideally 130 or less, 2 hours after meals less than 180.   Regular exercise also will help with controlling disease, daily brisk walking as tolerated for 15-30 min definitively will help.   Avoid skipping meals, blood sugar might drop and cause serious problems. Remember checking feet periodically, good dental hygiene, and annual eye exam.    Please be sure medication list is accurate. If a new problem present, please set up appointment sooner than planned today.

## 2017-04-24 ENCOUNTER — Other Ambulatory Visit: Payer: Self-pay | Admitting: Family Medicine

## 2017-07-30 NOTE — Progress Notes (Signed)
HPI:   Matthew [name] is a 51 y.o. male, who is here today for 5-6 months follow up.   He was last seen on January 31, 2017.   Insomnia: Currently he is on Amitriptyline 100 mg daily at bedtime. He is sleeping about 8-10 hours. Tolerating medication well.  He works third shift. History of OSA, he is not on CPAP.  DM 2: Chronic problem. Currently he is on Metformin 1000 mg twice daily. Last eye exam: Last year, Dr Nicki Reaper. No retinopathy.  BS: He doe snot check BS's.  Lab Results  Component Value Date   HGBA1C 7.0 (H) 01/31/2017   Lab Results  Component Value Date   MICROALBUR 0.6 05/25/2014    Denies abdominal pain, nausea,vomiting, polydipsia,polyuria, or polyphagia.   Anxiety: Currently he is on Clonazepam 2 mg at daily as needed. He is not on anxiolytic medication and has refused trying any. He has been on Clonazepam for many years, it helps with irritability.  He denies depressed mood or suicidal thoughts.   He denies side effects from medication. He lives with his mother.  Chronic pain: Currently he is on Tramadol 50 mg twice daily as needed. Last OV Meloxicam 15 mg daily was recommended but he did not start it.  Foot (left, s/p ankle surgery) and knee pain,  C/O achy foot and knee severe pain,"killing me"  Aggravated by some activities at work, prolonged standing and walking.  Tramadol still helps with pain, he denies side effects.   HDL: He also needs refills on Crestor 10 mg.  Lab Results  Component Value Date   CHOL 116 01/31/2017   HDL 52.20 01/31/2017   LDLCALC 40 01/31/2017   LDLDIRECT 96.0 04/10/2016   TRIG 122.0 01/31/2017   CHOLHDL 2 01/31/2017   He is trying to follow low fat diet. Tolerating statin well,deneis side effects.  DM II:  He is on Metformin 1000 mg bid. 01/31/17 HgA1C was 7.1.   He is not checking BS's.  Tolerating medication well, no side effects reported. Denies abdominal pain, nausea,vomiting,  polydipsia,polyuria, or polyphagia. He has not noted burning /numbness feet sensation.   Review of Systems  Constitutional: Negative for activity change, appetite change, fatigue and fever.  HENT: Negative for mouth sores, nosebleeds, sore throat and trouble swallowing.   Eyes: Negative for redness and visual disturbance.  Respiratory: Negative for apnea, cough, shortness of breath and wheezing.   Cardiovascular: Negative for chest pain, palpitations and leg swelling.  Gastrointestinal: Negative for abdominal pain, nausea and vomiting.       No changes in bowel habits.  Endocrine: Negative for cold intolerance, heat intolerance, polydipsia, polyphagia and polyuria.  Genitourinary: Negative for decreased urine volume, dysuria and hematuria.  Musculoskeletal: Positive for arthralgias. Negative for gait problem and myalgias.  Skin: Negative for rash and wound.  Neurological: Negative for dizziness, syncope, weakness, numbness and headaches.  Psychiatric/Behavioral: Positive for sleep disturbance. Negative for confusion and suicidal ideas. The patient is nervous/anxious.       Current Outpatient Medications on File Prior to Visit  Medication Sig Dispense Refill  . aspirin 81 MG tablet Take 81 mg by mouth daily.    . metFORMIN (GLUCOPHAGE) 1000 MG tablet TAKE 1 TABLET BY MOUTH TWICE A DAY WITH A MEAL 180 tablet 2  . omeprazole (PRILOSEC) 20 MG capsule Take 1 capsule (20 mg total) by mouth daily. 90 capsule 3   No current facility-administered medications on file prior to visit.  Past Medical History:  Diagnosis Date  . Anxiety   . Depression   . Diabetes mellitus   . GERD (gastroesophageal reflux disease)   . H/O hiatal hernia    ?  Marland Kitchen Mental disorder   . OSA (obstructive sleep apnea)    Not wearing CPAP  . Parotid mass    rt  . Sleep apnea    just started on cpap 2 weeks  . Stones in the urinary tract    No Known Allergies  Social History   Socioeconomic History    . Marital status: Single    Spouse name: None  . Number of children: None  . Years of education: None  . Highest education level: None  Social Needs  . Financial resource strain: None  . Food insecurity - worry: None  . Food insecurity - inability: None  . Transportation needs - medical: None  . Transportation needs - non-medical: None  Occupational History  . None  Tobacco Use  . Smoking status: Former Smoker    Packs/day: 0.25    Years: 5.00    Pack years: 1.25    Types: Cigarettes    Last attempt to quit: 05/28/2016    Years since quitting: 1.1  . Smokeless tobacco: Former Systems developer  . Tobacco comment: marijuana 06/26/12  occ social drinker  Substance and Sexual Activity  . Alcohol use: Yes    Comment: monthly  . Drug use: Yes    Frequency: 1.0 times per week    Types: Marijuana    Comment: denies recent use  . Sexual activity: None  Other Topics Concern  . None  Social History Narrative  . None    Vitals:   07/31/17 0909  BP: 116/72  Pulse: 95  Resp: 12  Temp: 98.6 F (37 C)  SpO2: 97%   Body mass index is 39.94 kg/m.   Physical Exam  Nursing note and vitals reviewed. Constitutional: He is oriented to person, place, and time. He appears well-developed. No distress.  HENT:  Head: Normocephalic and atraumatic.  Mouth/Throat: Oropharynx is clear and moist and mucous membranes are normal.  Eyes: Conjunctivae are normal. Pupils are equal, round, and reactive to light.  Cardiovascular: Normal rate and regular rhythm.  No murmur heard. Pulses:      Dorsalis pedis pulses are 2+ on the right side, and 2+ on the left side.  Respiratory: Effort normal and breath sounds normal. No respiratory distress.  GI: Soft. He exhibits no mass. There is no hepatomegaly. There is no tenderness.  Musculoskeletal: He exhibits no edema.       Right knee: He exhibits normal range of motion. Tenderness found. Medial joint line tenderness noted.  Right knee pain elicited with ROM  and palpation of medial aspect. No erythema. ROM with no significant limitation.  Lymphadenopathy:    He has no cervical adenopathy.  Neurological: He is alert and oriented to person, place, and time. He has normal strength. Gait normal.  Skin: Skin is warm. No erythema.  Psychiatric: His mood appears anxious. Cognition and memory are normal.  Well groomed, poor eye contact.   Diabetic Foot Exam - Simple   Simple Foot Form Diabetic Foot exam was performed with the following findings:  Yes 07/31/2017  9:57 AM  Visual Inspection See comments:  Yes Sensation Testing Intact to touch and monofilament testing bilaterally:  Yes Pulse Check Posterior Tibialis and Dorsalis pulse intact bilaterally:  Yes Comments Hypertrophic toe nails.      ASSESSMENT  AND PLAN:   Matthew [name] was seen today for 5-6 months follow-up.   Diagnoses and all orders for this visit:     Lab Results  Component Value Date   CREATININE 0.88 07/31/2017   BUN 14 07/31/2017   NA 139 07/31/2017   K 4.5 07/31/2017   CL 102 07/31/2017   CO2 29 07/31/2017   Lab Results  Component Value Date   MICROALBUR <0.7 07/31/2017   Lab Results  Component Value Date   HGBA1C 7.1 (H) 07/31/2017    Osteoarthritis, unspecified osteoarthritis type, unspecified site  Stable. No changes in current management. Side effects of Tramadol reviewed.  F/U in 3-4 months.  -     traMADol (ULTRAM) 50 MG tablet; TAKE 1 TABLET BY MOUTH EVERY 12 HOURS AS NEEDED FOR MODERATE PAIN  Type 2 diabetes mellitus without complication, without long-term current use of insulin (HCC)  HgA1C not at goal. No changes in current management, HgA1C pending today, Further recommendations will be given according to results.  Regular exercise and healthy diet with avoidance of added sugar food intake is an important part of treatment and recommended. Annual eye exam (overdue) , periodic dental and foot care recommended. F/U in 3-6  months  -     Basic metabolic panel -     Microalbumin / creatinine urine ratio -     Hemoglobin A1c  Chronic pain disorder  Medication contract signed 01/2017. F/U in 3-4 months. [place] controlled subs web site reviewed.  -     traMADol (ULTRAM) 50 MG tablet; TAKE 1 TABLET BY MOUTH EVERY 12 HOURS AS NEEDED FOR MODERATE PAIN  Insomnia, unspecified type  Stable. Good sleep hygiene.  No changes in current management, some side effects discussed as well as risk of interaction with Tramadol. F/U in 6 months.  Anxiety disorder, unspecified type  Stable overall. No changes in Clonazepam dose. Instructed about warning signs. F/U in 3-4 months.  -     clonazePAM (KLONOPIN) 2 MG tablet; TAKE 1 TABLET BY MOUTH EVERY DAY FOR ANXIETY prn  Need for influenza vaccination -     Flu Vaccine QUAD 36+ mos IM  Need for 23-polyvalent pneumococcal polysaccharide vaccine -     Pneumococcal polysaccharide vaccine 23-valent greater than or equal to 2yo subcutaneous/IM  Pure hypercholesterolemia  No changes in current management. F/U in 3-4  months.  -     rosuvastatin (CRESTOR) 10 MG tablet; Take 1 tablet (10 mg total) by mouth daily.    -Matthew [name] was advised to return sooner than planned today if new concerns arise.       Saad Buhl G. Martinique, MD  [facility]. [place] office.

## 2017-07-31 ENCOUNTER — Ambulatory Visit (INDEPENDENT_AMBULATORY_CARE_PROVIDER_SITE_OTHER): Payer: 59 | Admitting: Family Medicine

## 2017-07-31 ENCOUNTER — Encounter: Payer: Self-pay | Admitting: Family Medicine

## 2017-07-31 ENCOUNTER — Encounter: Payer: Self-pay | Admitting: *Deleted

## 2017-07-31 ENCOUNTER — Other Ambulatory Visit: Payer: Self-pay | Admitting: Family Medicine

## 2017-07-31 VITALS — BP 116/72 | HR 95 | Temp 98.6°F | Resp 12 | Ht 71.0 in | Wt 286.4 lb

## 2017-07-31 DIAGNOSIS — E7800 Pure hypercholesterolemia, unspecified: Secondary | ICD-10-CM | POA: Diagnosis not present

## 2017-07-31 DIAGNOSIS — G894 Chronic pain syndrome: Secondary | ICD-10-CM

## 2017-07-31 DIAGNOSIS — Z23 Encounter for immunization: Secondary | ICD-10-CM

## 2017-07-31 DIAGNOSIS — F419 Anxiety disorder, unspecified: Secondary | ICD-10-CM | POA: Diagnosis not present

## 2017-07-31 DIAGNOSIS — E119 Type 2 diabetes mellitus without complications: Secondary | ICD-10-CM

## 2017-07-31 DIAGNOSIS — G4700 Insomnia, unspecified: Secondary | ICD-10-CM

## 2017-07-31 DIAGNOSIS — M1990 Unspecified osteoarthritis, unspecified site: Secondary | ICD-10-CM | POA: Diagnosis not present

## 2017-07-31 LAB — BASIC METABOLIC PANEL
BUN: 14 mg/dL (ref 6–23)
CO2: 29 meq/L (ref 19–32)
Calcium: 9.3 mg/dL (ref 8.4–10.5)
Chloride: 102 mEq/L (ref 96–112)
Creatinine, Ser: 0.88 mg/dL (ref 0.40–1.50)
GFR: 96.2 mL/min (ref >60.00)
GLUCOSE: 145 mg/dL — AB (ref 70–99)
Potassium: 4.5 mEq/L (ref 3.5–5.1)
Sodium: 139 mEq/L (ref 135–145)

## 2017-07-31 LAB — MICROALBUMIN / CREATININE URINE RATIO
Creatinine,U: 150.1 mg/dL
MICROALB/CREAT RATIO: 0.5 mg/g (ref 0.0–30.0)
Microalb, Ur: <0.7 mg/dL (ref 0.0–1.9)

## 2017-07-31 LAB — HEMOGLOBIN A1C: Hgb A1c MFr Bld: 7.1 % — ABNORMAL HIGH (ref 4.6–6.5)

## 2017-07-31 MED ORDER — TRAMADOL HCL 50 MG PO TABS: ORAL_TABLET | ORAL | 3 refills | Status: DC

## 2017-07-31 MED ORDER — ROSUVASTATIN CALCIUM 10 MG PO TABS: 10.0000 mg | ORAL_TABLET | Freq: Every day | ORAL | 2 refills | Status: DC

## 2017-07-31 MED ORDER — CLONAZEPAM 2 MG PO TABS: ORAL_TABLET | ORAL | 3 refills | Status: DC

## 2017-07-31 NOTE — Patient Instructions (Signed)
A few things to remember from today's visit:   Insomnia, unspecified type  Type 2 diabetes mellitus without complication, without long-term current use of insulin ([place]) - Plan: Basic metabolic panel, Microalbumin / creatinine urine ratio, Hemoglobin A1c  Chronic pain disorder  Anxiety disorder, unspecified type - Plan: clonazePAM (KLONOPIN) 2 MG tablet  Need for influenza vaccination - Plan: Flu Vaccine QUAD 36+ mos IM  Osteoarthritis, unspecified osteoarthritis type, unspecified site - Plan: traMADol (ULTRAM) 50 MG tablet   Please be sure medication list is accurate. If a new problem present, please set up appointment sooner than planned today.

## 2017-08-17 ENCOUNTER — Encounter: Payer: Self-pay | Admitting: *Deleted

## 2017-10-26 ENCOUNTER — Other Ambulatory Visit: Payer: Self-pay | Admitting: *Deleted

## 2017-10-26 DIAGNOSIS — G4700 Insomnia, unspecified: Secondary | ICD-10-CM

## 2017-10-26 MED ORDER — AMITRIPTYLINE HCL 100 MG PO TABS: ORAL_TABLET | ORAL | 1 refills | Status: DC

## 2018-01-28 ENCOUNTER — Other Ambulatory Visit: Payer: Self-pay | Admitting: Family Medicine

## 2018-01-28 DIAGNOSIS — G894 Chronic pain syndrome: Secondary | ICD-10-CM

## 2018-01-28 DIAGNOSIS — F419 Anxiety disorder, unspecified: Secondary | ICD-10-CM

## 2018-01-28 DIAGNOSIS — M1990 Unspecified osteoarthritis, unspecified site: Secondary | ICD-10-CM

## 2018-01-31 NOTE — Progress Notes (Signed)
HPI:   Matthew [name] is a 52 y.o. male, who is here today for 6 months follow up.   He was last seen on 07/31/17,he was supposed to follow earlier this year.  Hx of chronic pain,achy ankle and IP feet pain, severe. S/P left ankle surgery. He is on Tramadol 50 mg bid prn. Mobic has been recommended.   He does not remember how he did with Meloxicam. Pain is severe,mainly at the end of the day after prolonged standing.  + Cramps while driving a couple days ago. He states that he tries to drink "a lot of water" during the day,so he does not get dehydrated. Work place is hot,states that if temp outside is 80"s inside building can be 100 F.  He is tolerating Tramadol well, denies side effects.  He denies receiving Rx for another provider or illicit drug use.  Insomnia: He is on Amitriptyline 100 mg at bedtime. Sleeping about 7 hours.  He feels rested next day.  OSA not wearing CPAP.  Anxiety: He is on Clonazepam 2 mg daily as needed. He has not been interested in adding anxiolytic. He denies depressed mood or suicidal thoughts.   Diabetes Mellitus II:    Currently on Metformin 1000 mg bid. Problem has been stable. Checking BS's : "[name] good." Hypoglycemia: Denies  He is tolerating medications well. He denies abdominal pain, nausea, vomiting, polydipsia, polyuria, or polyphagia. Negative for numbness, tingling, or burning.   He is trying to eat healthier. Decreased soda intake.   Lab Results  Component Value Date   CREATININE 0.88 07/31/2017   BUN 14 07/31/2017   NA 139 07/31/2017   K 4.5 07/31/2017   CL 102 07/31/2017   CO2 29 07/31/2017    Lab Results  Component Value Date   HGBA1C 7.1 (H) 07/31/2017     Hyperlipidemia:  Currently on Crestor 10 mg daily. Following a low fat diet: Not consistently.  She has not noted side effects with medication.  Lab Results  Component Value Date   CHOL 116 01/31/2017   HDL 52.20 01/31/2017   LDLCALC 40 01/31/2017   LDLDIRECT 96.0 04/10/2016   TRIG 122.0 01/31/2017   CHOLHDL 2 01/31/2017    Since his last visit he has decreased salt intake and is trying to watch what he eats. He cannot exercise regularly because ankle pain. He has not noted weight loss so far.     Review of Systems  Constitutional: Negative for activity change, appetite change, fatigue and fever.  HENT: Negative for nosebleeds, sore throat and trouble swallowing.   Eyes: Negative for redness and visual disturbance.  Respiratory: Negative for cough, shortness of breath and wheezing.   Cardiovascular: Negative for chest pain, palpitations and leg swelling.  Gastrointestinal: Negative for abdominal pain, nausea and vomiting.  Endocrine: Negative for polydipsia, polyphagia and polyuria.  Genitourinary: Negative for decreased urine volume, dysuria and hematuria.  Musculoskeletal: Positive for arthralgias and myalgias. Negative for gait problem.  Skin: Negative for rash and wound.  Neurological: Negative for syncope, weakness, numbness and headaches.  Psychiatric/Behavioral: Positive for sleep disturbance. Negative for confusion. The patient is nervous/anxious.      Current Outpatient Medications on File Prior to Visit  Medication Sig Dispense Refill  . aspirin 81 MG tablet Take 81 mg by mouth daily.    Marland Kitchen omeprazole (PRILOSEC) 20 MG capsule Take 1 capsule (20 mg total) by mouth daily. 90 capsule 3  . rosuvastatin (CRESTOR) 10 MG tablet Take  1 tablet (10 mg total) by mouth daily. 90 tablet 2   No current facility-administered medications on file prior to visit.      Past Medical History:  Diagnosis Date  . Anxiety   . Depression   . Diabetes mellitus   . GERD (gastroesophageal reflux disease)   . H/O hiatal hernia    ?  Marland Kitchen Mental disorder   . OSA (obstructive sleep apnea)    Not wearing CPAP  . Parotid mass    rt  . Sleep apnea    just started on cpap 2 weeks  . Stones in the urinary tract      No Known Allergies  Social History   Socioeconomic History  . Marital status: Single    Spouse name: Not on file  . Number of children: Not on file  . Years of education: Not on file  . Highest education level: Not on file  Occupational History  . Not on file  Social Needs  . Financial resource strain: Not on file  . Food insecurity:    Worry: Not on file    Inability: Not on file  . Transportation needs:    Medical: Not on file    Non-medical: Not on file  Tobacco Use  . Smoking status: Former Smoker    Packs/day: 0.25    Years: 5.00    Pack years: 1.25    Types: Cigarettes    Last attempt to quit: 05/28/2016    Years since quitting: 1.6  . Smokeless tobacco: Former Systems developer  . Tobacco comment: marijuana 06/26/12  occ social drinker  Substance and Sexual Activity  . Alcohol use: Yes    Comment: monthly  . Drug use: Yes    Frequency: 1.0 times per week    Types: Marijuana    Comment: denies recent use  . Sexual activity: Not on file  Lifestyle  . Physical activity:    Days per week: Not on file    Minutes per session: Not on file  . Stress: Not on file  Relationships  . Social connections:    Talks on phone: Not on file    Gets together: Not on file    Attends religious service: Not on file    Active member of club or organization: Not on file    Attends meetings of clubs or organizations: Not on file    Relationship status: Not on file  Other Topics Concern  . Not on file  Social History Narrative  . Not on file    Vitals:   02/01/18 0842  BP: 120/76  Pulse: 87  Resp: 12  Temp: 98.7 F (37.1 C)  SpO2: 96%   Body mass index is 39.19 kg/m.   Physical Exam  Nursing note and vitals reviewed. Constitutional: He is oriented to person, place, and time. He appears well-developed. No distress.  HENT:  Head: Normocephalic and atraumatic.  Mouth/Throat: Oropharynx is clear and moist and mucous membranes are normal.  Eyes: Pupils are equal, round, and  reactive to light. Conjunctivae are normal.  Cardiovascular: Normal rate and regular rhythm.  No murmur heard. Pulses:      Dorsalis pedis pulses are 2+ on the right side, and 2+ on the left side.  Respiratory: Effort normal and breath sounds normal. No respiratory distress.  GI: Soft. He exhibits no mass. There is no hepatomegaly. There is no tenderness.  Musculoskeletal: He exhibits edema (Trace pitting LE edema,bilateral.). He exhibits no tenderness.  Pain upon palpation  of posterior aspect of left ankle with bony prominence and callus (reported as stable). No pain elicited with ROM.    Lymphadenopathy:    He has no cervical adenopathy.  Neurological: He is alert and oriented to person, place, and time. He has normal strength.  Skin: Skin is warm. No erythema.  Psychiatric: He has a normal mood and affect. Cognition and memory are normal.  Well groomed, good eye contact.     ASSESSMENT AND PLAN:   Matthew [name] was seen today for follow-up.  Orders Placed This Encounter  Procedures  . Comprehensive metabolic panel  . Hemoglobin A1c  . Pain Mgmt, Profile 8 w/Conf, U  . Lipid panel  . LDL cholesterol, direct   Lab Results  Component Value Date   CHOL 180 02/01/2018   HDL 40.00 02/01/2018   LDLCALC 40 01/31/2017   LDLDIRECT 81.0 02/01/2018   TRIG 276.0 (H) 02/01/2018   CHOLHDL 5 02/01/2018   Lab Results  Component Value Date   HGBA1C 6.8 (H) 02/01/2018   Lab Results  Component Value Date   ALT 64 (H) 02/01/2018   AST 36 02/01/2018   ALKPHOS 73 02/01/2018   BILITOT 0.4 02/01/2018   Lab Results  Component Value Date   CREATININE 0.98 02/01/2018   BUN 16 02/01/2018   NA 141 02/01/2018   K 4.4 02/01/2018   CL 103 02/01/2018   CO2 28 02/01/2018    Type 2 diabetes mellitus ([place]) HgA1C pending. No changes in current management, we will adjust it according to HgA1C. Regular exercise and healthy diet with avoidance of added sugar food intake is an  important part of treatment and recommended. Annual eye exam, periodic dental and foot care recommended. F/U in 5-6 months   OA (osteoarthritis) Tolerating Tramadol well, still helping with pain. He would like to try again meloxicam 15 mg daily, we discussed some side effects of chronic use of NSAIDs. Medication contract signed today. Urine tox screen ordered today. Follow-up in 3 to 4 months.  Anxiety disorder Otherwise problem has been well controlled with clonazepam 2 mg daily as needed and amitriptyline 100 mg at bedtime. No changes to current management. Follow-up in 3 to 4 months.  Insomnia Continue amitriptyline 100 mg at bedtime. Good sleep hygiene also recommended. Follow-up in 3 to 4 months.  Pure hypercholesterolemia No changes in current management, will follow labs done today and will give further recommendations accordingly. Follow-up in 6 to 12 months depending on lipid panel results.   Chronic pain disorder  We discussed current recommendations in regard to pain management with controlled mediations. F/U in 4 months.     Atina Feeley G. Martinique, MD  [facility]. [place] office.

## 2018-02-01 ENCOUNTER — Encounter: Payer: Self-pay | Admitting: *Deleted

## 2018-02-01 ENCOUNTER — Ambulatory Visit (INDEPENDENT_AMBULATORY_CARE_PROVIDER_SITE_OTHER): Payer: 59 | Admitting: Family Medicine

## 2018-02-01 ENCOUNTER — Encounter: Payer: Self-pay | Admitting: Family Medicine

## 2018-02-01 VITALS — BP 120/76 | HR 87 | Temp 98.7°F | Resp 12 | Ht 71.0 in | Wt 281.0 lb

## 2018-02-01 DIAGNOSIS — E119 Type 2 diabetes mellitus without complications: Secondary | ICD-10-CM

## 2018-02-01 DIAGNOSIS — E7800 Pure hypercholesterolemia, unspecified: Secondary | ICD-10-CM

## 2018-02-01 DIAGNOSIS — M1990 Unspecified osteoarthritis, unspecified site: Secondary | ICD-10-CM | POA: Diagnosis not present

## 2018-02-01 DIAGNOSIS — G4700 Insomnia, unspecified: Secondary | ICD-10-CM

## 2018-02-01 DIAGNOSIS — G894 Chronic pain syndrome: Secondary | ICD-10-CM | POA: Diagnosis not present

## 2018-02-01 DIAGNOSIS — F419 Anxiety disorder, unspecified: Secondary | ICD-10-CM | POA: Diagnosis not present

## 2018-02-01 LAB — COMPREHENSIVE METABOLIC PANEL
ALT: 64 U/L — ABNORMAL HIGH (ref 0–53)
AST: 36 U/L (ref 0–37)
Albumin: 4.4 g/dL (ref 3.5–5.2)
Alkaline Phosphatase: 73 U/L (ref 39–117)
BUN: 16 mg/dL (ref 6–23)
CHLORIDE: 103 meq/L (ref 96–112)
CO2: 28 meq/L (ref 19–32)
CREATININE: 0.98 mg/dL (ref 0.40–1.50)
Calcium: 9.2 mg/dL (ref 8.4–10.5)
GFR: 84.8 mL/min (ref >60.00)
Glucose, Bld: 128 mg/dL — ABNORMAL HIGH (ref 70–99)
Potassium: 4.4 mEq/L (ref 3.5–5.1)
Sodium: 141 mEq/L (ref 135–145)
Total Bilirubin: 0.4 mg/dL (ref 0.2–1.2)
Total Protein: 7 g/dL (ref 6.0–8.3)

## 2018-02-01 LAB — LIPID PANEL
CHOL/HDL RATIO: 5
CHOLESTEROL: 180 mg/dL (ref 0–200)
HDL: 40 mg/dL (ref >39.00)
NonHDL: 140.34
Triglycerides: 276 mg/dL — ABNORMAL HIGH (ref 0.0–149.0)
VLDL: 55.2 mg/dL — AB (ref 0.0–40.0)

## 2018-02-01 LAB — LDL CHOLESTEROL, DIRECT: Direct LDL: 81 mg/dL

## 2018-02-01 LAB — HEMOGLOBIN A1C: Hgb A1c MFr Bld: 6.8 % — ABNORMAL HIGH (ref 4.6–6.5)

## 2018-02-01 MED ORDER — CLONAZEPAM 2 MG PO TABS: ORAL_TABLET | ORAL | 3 refills | Status: DC

## 2018-02-01 MED ORDER — TRAMADOL HCL 50 MG PO TABS: ORAL_TABLET | ORAL | 3 refills | Status: DC

## 2018-02-01 MED ORDER — METFORMIN HCL 1000 MG PO TABS: ORAL_TABLET | ORAL | 2 refills | Status: DC

## 2018-02-01 MED ORDER — AMITRIPTYLINE HCL 100 MG PO TABS: ORAL_TABLET | ORAL | 1 refills | Status: DC

## 2018-02-01 MED ORDER — MELOXICAM 15 MG PO TABS: 15.0000 mg | ORAL_TABLET | Freq: Every day | ORAL | 1 refills | Status: DC

## 2018-02-01 NOTE — Assessment & Plan Note (Signed)
No changes in current management, will follow labs done today and will give further recommendations accordingly. Follow-up in 6 to 12 months depending on lipid panel results.

## 2018-02-01 NOTE — Assessment & Plan Note (Signed)
Continue amitriptyline 100 mg at bedtime. Good sleep hygiene also recommended. Follow-up in 3 to 4 months.

## 2018-02-01 NOTE — Patient Instructions (Addendum)
A few things to remember from today's visit:   Osteoarthritis, unspecified osteoarthritis type, unspecified site - Plan: Comprehensive metabolic panel, Pain Mgmt, Profile 8 w/Conf, U, traMADol (ULTRAM) 50 MG tablet, meloxicam (MOBIC) 15 MG tablet  Type 2 diabetes mellitus without complication, without long-term current use of insulin (HCC) - Plan: Comprehensive metabolic panel, Hemoglobin A1c  Anxiety disorder, unspecified type - Plan: clonazePAM (KLONOPIN) 2 MG tablet  Insomnia, unspecified type - Plan: amitriptyline (ELAVIL) 100 MG tablet  Type 2 diabetes mellitus with hyperglycemia, without long-term current use of insulin (HCC) - Plan: metFORMIN (GLUCOPHAGE) 1000 MG tablet  Chronic pain disorder - Plan: traMADol (ULTRAM) 50 MG tablet, meloxicam (MOBIC) 15 MG tablet  Pure hypercholesterolemia - Plan: Lipid panel   Because you are taking to controlled medications, we need to follow every 3 to 4 months.  Continue working on weight loss. Depending of lab results we may need to adjust your medications. Meloxicam 15 mg in the morning may also use with pain, remember this medication can increase the risks of kidney disease and cardiovascular problems in general.  Monitor your blood pressure regularly.  Please be sure medication list is accurate. If a new problem present, please set up appointment sooner than planned today.

## 2018-02-01 NOTE — Assessment & Plan Note (Signed)
Otherwise problem has been well controlled with clonazepam 2 mg daily as needed and amitriptyline 100 mg at bedtime. No changes to current management. Follow-up in 3 to 4 months.

## 2018-02-01 NOTE — Assessment & Plan Note (Signed)
HgA1C pending. No changes in current management, we will adjust it according to HgA1C. Regular exercise and healthy diet with avoidance of added sugar food intake is an important part of treatment and recommended. Annual eye exam, periodic dental and foot care recommended. F/U in 5-6 months

## 2018-02-01 NOTE — Assessment & Plan Note (Signed)
Tolerating Tramadol well, still helping with pain. He would like to try again meloxicam 15 mg daily, we discussed some side effects of chronic use of NSAIDs. Medication contract signed today. Urine tox screen ordered today. Follow-up in 3 to 4 months.

## 2018-02-13 ENCOUNTER — Encounter: Payer: Self-pay | Admitting: *Deleted

## 2018-02-15 ENCOUNTER — Telehealth: Payer: Self-pay

## 2018-02-15 NOTE — Telephone Encounter (Signed)
Pt. Given lab results and instructions. Verbalizes understanding. Informed pt. He will be receiving a letter with results as well.

## 2018-03-31 ENCOUNTER — Other Ambulatory Visit: Payer: Self-pay | Admitting: Family Medicine

## 2018-03-31 DIAGNOSIS — M1990 Unspecified osteoarthritis, unspecified site: Secondary | ICD-10-CM

## 2018-03-31 DIAGNOSIS — G894 Chronic pain syndrome: Secondary | ICD-10-CM

## 2018-04-17 ENCOUNTER — Encounter: Payer: Self-pay | Admitting: Family Medicine

## 2018-04-17 ENCOUNTER — Ambulatory Visit (INDEPENDENT_AMBULATORY_CARE_PROVIDER_SITE_OTHER): Payer: 59 | Admitting: Family Medicine

## 2018-04-17 ENCOUNTER — Ambulatory Visit: Payer: Self-pay | Admitting: *Deleted

## 2018-04-17 ENCOUNTER — Telehealth: Payer: Self-pay

## 2018-04-17 DIAGNOSIS — S134XXA Sprain of ligaments of cervical spine, initial encounter: Secondary | ICD-10-CM | POA: Diagnosis not present

## 2018-04-17 MED ORDER — PREDNISONE 50 MG PO TABS
50.0000 mg | ORAL_TABLET | Freq: Every day | ORAL | 0 refills | Status: DC
Start: 2018-04-17 — End: 2018-06-24

## 2018-04-17 MED ORDER — TIZANIDINE HCL 4 MG PO TABS: 4.0000 mg | ORAL_TABLET | Freq: Every evening | ORAL | 2 refills | Status: DC

## 2018-04-17 NOTE — Telephone Encounter (Signed)
I confirmed patient was seen at [facility] office today.

## 2018-04-17 NOTE — Patient Instructions (Addendum)
Good to see you  Ice 20 minutes 2 times daily. Usually after activity and before bed. Prednisone daily for 5 days  Zanaflex at night for next 3 nights then as needed Over the counter get  Vitamin D 2000 I daily  Tart cherry extract any dose at night 25# lifting limit for a week See me again in 1 week

## 2018-04-17 NOTE — Telephone Encounter (Signed)
Pt and his mother, Britt Boozer, called because he fell on 04/15/18 in the morning; he states that he was going down the steps and he fell about 5 ft; he reports an ongoing headache, his nose hurts, and his neck hurts; he states that he has taken tramadol, and ibuprofen "helped a little bit"; recommendations made per nurse triage protocol to include being seen within 2-3 days; will refer to concussion clinic; pt verbalized understanding that he will get a call back from the clinic; the pt can be contacted at [phone]; also left message on the voicemail of [facility] [phone]; also contacted by Vita Barley at [facility]; she reports that the pt had contacted [facility] this morning and was directed to proceed to the ED; Vita Barley also says that if the pt can not be seen in the concussion clinic today he can be seen at the [facility] office; will also route to office for notification of this encounter; also spoke with Douglass Rivers at the [facility] for notification of this referral; she states that they will contact the pt.    Reason for Disposition . [1] After 72 hours AND [2] headache persists  Answer Assessment - Initial Assessment Questions 1. MECHANISM: "How did the injury happen?" For falls, ask: "What height did you fall from?" and "What surface did you fall against?"      Fell 5 ft onto flat surface concrete 2. ONSET: "When did the injury happen?" (Minutes or hours ago)      04/15/18 3. NEUROLOGIC SYMPTOMS: "Was there any loss of consciousness?" "Are there any other neurological symptoms?"      no 4. MENTAL STATUS: "Does the person know who he is, who you are, and where he is?"      yes 5. LOCATION: "What part of the head was hit?"      At hairline of head 6. SCALP APPEARANCE: "What does the scalp look like? Is it bleeding now?" If so, ask: "Is it difficult to stop?"      no 7. SIZE: For cuts, bruises, or swelling, ask: "How large is it?" (e.g., inches or centimeters)   No swelling but painful to the touch; bruising app 1.5 - 2 inches 8. PAIN: "Is there any pain?" If so, ask: "How bad is it?"  (e.g., Scale 1-10; or mild, moderate, severe)     Headache rated 7 out of 10; neck pain rated 7-9 out of 10 9. TETANUS: For any breaks in the skin, ask: "When was the last tetanus booster?"     no 10. OTHER SYMPTOMS: "Do you have any other symptoms?" (e.g., neck pain, vomiting)       Neck pain; nose pain rated 5 out of 10 (top left part of nose)  11. PREGNANCY: "Is there any chance you are pregnant?" "When was your last menstrual period?"       N/a  Protocols used: HEAD INJURY-A-AH

## 2018-04-17 NOTE — Telephone Encounter (Signed)
Monitor for arrival at either ER or concussion clinic.

## 2018-04-17 NOTE — Assessment & Plan Note (Signed)
Patient has what appears to be more of a whiplash injury.  We discussed with patient at great length about icing regimen, home exercise, prednisone as well as a muscle relaxer.  Warned of potential side effects.  Patient does have some chronic depression and chronic pain is likely going to complicate some of the healing process.  Patient will be put on a 25 pound lifting limit at the moment.  Follow-up with me again in 1 week.

## 2018-04-17 NOTE — Telephone Encounter (Signed)
Spoke with patient who fell off his deck on 04/15/2018 hitting his head. He has been having a constant headache since then and would like to be seen today. No history of LOC or history of concussion. On schedule for 12:45pm.

## 2018-04-17 NOTE — Progress Notes (Addendum)
Subjective:   I, Matthew [name], am serving as a scribe for Dr. Hulan Saas.  Chief Complaint: Matthew [name], DOB: [birth year], is a 52 y.o. male who presents for head injury on 04/15/2018. He fell off a deck landing on his forehead. He did not lose consciousness. No history of head injury. He has been having a constant headache which he has managed with tylenol and tramdol. He drove back from [facility] and this did not increase his symptoms. Works third shift at [employer] and this did not increase his symptoms either.  Chief Complaint  Patient presents with  . Head Injury    Injury date : 04/15/2018 Visit #: 1  Previous imagine.   History of Present Illness:    Concussion Self-Reported Symptom Score Symptoms rated on a scale 1-6, in last 24 hours  Headache:6  Nausea: 0  Vomiting: 0  Balance Difficulty: 4   Dizziness: 0  Fatigue: 6  Trouble Falling Asleep: 5  Sleep More Than Usual: 0  Sleep Less Than Usual: 0  Daytime Drowsiness: 4  Photophobia: 0  Phonophobia: 4  Irritability: 6  Sadness: 0  Nervousness: 4  Feeling More Emotional: 0  Numbness or Tingling: 0  Feeling Slowed Down: 5  Feeling Mentally Foggy: 0  Difficulty Concentrating: 6  Difficulty Remembering:0  Visual Problems: 3    Total Symptom Score: 58   Review of Systems: Pertinent items are noted in HPI.  Review of History: Past Medical History:  Past Medical History:  Diagnosis Date  . Anxiety   . Depression   . Diabetes mellitus   . GERD (gastroesophageal reflux disease)   . H/O hiatal hernia    ?  Marland Kitchen Mental disorder   . OSA (obstructive sleep apnea)    Not wearing CPAP  . Parotid mass    rt  . Sleep apnea    just started on cpap 2 weeks  . Stones in the urinary tract     Past Surgical History:  has a past surgical history that includes Parotidectomy (07/04/2012) and Hematoma evacuation (07/04/2012). Family History: family history includes Cancer in his mother; Diabetes in his  father; Mental illness in his mother; Stroke in his brother. no family history of autoimmune Social History:  reports that he quit smoking about 22 months ago. His smoking use included cigarettes. He has a 1.25 pack-year smoking history. He has quit using smokeless tobacco. He reports that he drinks alcohol. He reports that he has current or past drug history. Drug: Marijuana. Frequency: 1.00 time per week. Current Medications: has a current medication list which includes the following prescription(s): amitriptyline, aspirin, clonazepam, meloxicam, metformin, omeprazole, rosuvastatin, tramadol, prednisone, and tizanidine. Allergies: has No Known Allergies.  Objective:    Physical Examination Vitals:   04/17/18 1241  BP: 130/78  Pulse: 82  SpO2: 97%   General: No apparent distress alert and oriented x3 mood and affect normal, dressed appropriately.  HEENT: Pupils equal, extraocular movements intact  Respiratory: Patient's speak in full sentences and does not appear short of breath  Cardiovascular: No lower extremity edema, non tender, no erythema  Skin: Warm dry intact with no signs of infection or rash on extremities or on axial skeleton.  Abdomen: Soft nontender  Neuro: Cranial nerves II through XII are intact, neurovascularly intact in all extremities with 2+ DTRs and 2+ pulses.  Lymph: No lymphadenopathy of posterior or anterior cervical chain or axillae bilaterally.  Gait normal with good balance and coordination.  MSK:  Non tender with full range of motion and good stability and symmetric strength and tone of shoulders, elbows, wrist,  knee and ankles bilaterally.  Psychiatric: Oriented X3, intact recent and remote memory, judgement and insight, normal mood and affect patient is somewhat anxious  Neck: Inspection loss of lordosis. No palpable stepoffs. Negative Spurling's maneuver. Loss of range of motion with sidebending bilaterally. Grip strength and sensation normal in  bilateral hands Strength good C4 to T1 distribution No sensory change to C4 to T1 Negative Hoffman sign bilaterally Reflexes normal Tightness of the trapezius bilaterally  [id number]; 15 additional minutes spent for Therapeutic exercises as stated in above notes.  This included exercises focusing on stretching, strengthening, with significant focus on eccentric aspects.   Long term goals include an improvement in range of motion, strength, endurance as well as avoiding reinjury. Patient's frequency would include in 1-2 times a day, 3-5 times a week for a duration of 6-12 weeks.  Proper technique shown and discussed handout in great detail with ATC.  All questions were discussed and answered.    I was personally involved with the physical evaluation of and am in agreement with the assessment and treatment plan for this patient.  Greater than 50% of this encounter was spent in direct consultation with the patient in evaluation, counseling, and coordination of care. Duration of encounter: 33 minutes. After Visit Summary printed out and provided to patient as appropriate.   The above documentation has been reviewed and is accurate and complete Lyndal Pulley

## 2018-04-23 NOTE — Progress Notes (Signed)
[name] [address] [place], [place] [address] Phone: [phone] Subjective:   [name], am serving as a scribe for Dr. Hulan Saas.  CC: head injury follow up   JOI:NOMVEHMCNO  Matthew [name] is a 52 y.o. male coming in with complaint of head injury. His headaches are not as intense as they were. Today his headache 5/10. Yesterday, his headache was intense he states. The loud environment of work exacerbates his symptoms. He also notes that his neck is painful on the left side. Notes carrying a bale of hay and the strain of his neck gave him a headache.     Symptom score today of 37. Previous symptom score of 58.  Past Medical History:  Diagnosis Date  . Anxiety   . Depression   . Diabetes mellitus   . GERD (gastroesophageal reflux disease)   . H/O hiatal hernia    ?  Marland Kitchen Mental disorder   . OSA (obstructive sleep apnea)    Not wearing CPAP  . Parotid mass    rt  . Sleep apnea    just started on cpap 2 weeks  . Stones in the urinary tract    Past Surgical History:  Procedure Laterality Date  . HEMATOMA EVACUATION  07/04/2012   Procedure: EVACUATION HEMATOMA;  Surgeon: Melida Quitter, MD;  Location: [facility];  Service: ENT;  Laterality: Right;  . PAROTIDECTOMY  07/04/2012   Procedure: PAROTIDECTOMY;  Surgeon: Melida Quitter, MD;  [facility]: [facility] OR;  Service: ENT;  Laterality: Right;  right parotidectomy   Social History   Socioeconomic History  . Marital status: Single    Spouse name: Not on file  . Number of children: Not on file  . Years of education: Not on file  . Highest education level: Not on file  Occupational History  . Not on file  Social Needs  . Financial resource strain: Not on file  . Food insecurity:    Worry: Not on file    Inability: Not on file  . Transportation needs:    Medical: Not on file    Non-medical: Not on file  Tobacco Use  . Smoking status: Former Smoker    Packs/day: 0.25    Years: 5.00    Pack  years: 1.25    Types: Cigarettes    Last attempt to quit: 05/28/2016    Years since quitting: 1.9  . Smokeless tobacco: Former Systems developer  . Tobacco comment: marijuana 06/26/12  occ social drinker  Substance and Sexual Activity  . Alcohol use: Yes    Comment: monthly  . Drug use: Yes    Frequency: 1.0 times per week    Types: Marijuana    Comment: denies recent use  . Sexual activity: Not on file  Lifestyle  . Physical activity:    Days per week: Not on file    Minutes per session: Not on file  . Stress: Not on file  Relationships  . Social connections:    Talks on phone: Not on file    Gets together: Not on file    Attends religious service: Not on file    Active member of club or organization: Not on file    Attends meetings of clubs or organizations: Not on file    Relationship status: Not on file  Other Topics Concern  . Not on file  Social History Narrative  . Not on file   No Known Allergies Family History  Problem  Relation Age of Onset  . Cancer Mother        breast  . Mental illness Mother   . Diabetes Father   . Stroke Brother     Current Outpatient Medications (Endocrine & Metabolic):  .  metFORMIN (GLUCOPHAGE) 1000 MG tablet, TAKE 1 TABLET BY MOUTH TWICE A DAY WITH A MEAL .  predniSONE (DELTASONE) 50 MG tablet, Take 1 tablet (50 mg total) by mouth daily.  Current Outpatient Medications (Cardiovascular):  .  rosuvastatin (CRESTOR) 10 MG tablet, Take 1 tablet (10 mg total) by mouth daily.   Current Outpatient Medications (Analgesics):  .  aspirin 81 MG tablet, Take 81 mg by mouth daily. .  meloxicam (MOBIC) 15 MG tablet, TAKE 1 TABLET BY MOUTH EVERY DAY .  traMADol (ULTRAM) 50 MG tablet, TAKE 1 TABLET BY MOUTH EVERY 12 HOURS AS NEEDED FOR MODERATE PAIN   Current Outpatient Medications (Other):  .  amitriptyline (ELAVIL) 100 MG tablet, TAKE 1 TABLET BY MOUTH EVERYDAY AT BEDTIME .  clonazePAM (KLONOPIN) 2 MG tablet, TAKE 1 TABLET BY MOUTH EVERY DAY FOR  ANXIETY prn .  omeprazole (PRILOSEC) 20 MG capsule, Take 1 capsule (20 mg total) by mouth daily. Marland Kitchen  tiZANidine (ZANAFLEX) 4 MG tablet, Take 1 tablet (4 mg total) by mouth Nightly for 10 days.    Past medical history, social, surgical and family history all reviewed in electronic medical record.  No pertanent information unless stated regarding to the chief complaint.   Review of Systems:  No headache, visual changes, nausea, vomiting, diarrhea, constipation, dizziness, abdominal pain, skin rash, fevers, chills, night sweats, weight loss, swollen lymph nodes, body aches, joint swelling, muscle aches, chest pain, shortness of breath, mood changes.   Objective  Blood pressure 100/68, pulse 97, height 5\' 11"  (1.803 m), weight 281 lb (127.5 kg), SpO2 97 %.    General: No apparent distress alert and oriented x3 mood and affect normal, dressed appropriately.  HEENT: Pupils equal, extraocular movements intact  Respiratory: Patient's speak in full sentences and does not appear short of breath  Cardiovascular: No lower extremity edema, non tender, no erythema  Skin: Warm dry intact with no signs of infection or rash on extremities or on axial skeleton.  Abdomen: Soft nontender  Neuro: Cranial nerves II through XII are intact, neurovascularly intact in all extremities with 2+ DTRs and 2+ pulses.  Lymph: No lymphadenopathy of posterior or anterior cervical chain or axillae bilaterally.  Gait normal with good balance and coordination.  MSK:  Non tender with full range of motion and good stability and symmetric strength and tone of shoulders, elbows, wrist, hip, knee and ankles bilaterally.  Mild arthritic changes of multiple joints Neck: Inspection loss of lordosis. No palpable stepoffs. Negative Spurling's maneuver. Continue limited range of motion in all planes by 5 to 10 degrees Grip strength and sensation normal in bilateral hands Strength good C4 to T1 distribution No sensory change to C4 to  T1 Negative Hoffman sign bilaterally Reflexes normal Tightness of the trapezius bilaterally   Impression and Recommendations:     This case required medical decision making of moderate complexity. The above documentation has been reviewed and is accurate and complete Lyndal Pulley, DO       Note: This dictation was prepared with Dragon dictation along with smaller phrase technology. Any transcriptional errors that result from this process are unintentional.

## 2018-04-24 ENCOUNTER — Encounter: Payer: Self-pay | Admitting: Family Medicine

## 2018-04-24 ENCOUNTER — Ambulatory Visit (INDEPENDENT_AMBULATORY_CARE_PROVIDER_SITE_OTHER)
Admission: RE | Admit: 2018-04-24 | Discharge: 2018-04-24 | Disposition: A | Discharge status: Home / Self Care | Payer: 59 | Source: Ambulatory Visit | Attending: Family Medicine | Admitting: Family Medicine

## 2018-04-24 ENCOUNTER — Ambulatory Visit (INDEPENDENT_AMBULATORY_CARE_PROVIDER_SITE_OTHER): Payer: 59 | Admitting: Family Medicine

## 2018-04-24 VITALS — BP 100/68 | HR 97 | Ht 71.0 in | Wt 281.0 lb

## 2018-04-24 DIAGNOSIS — M542 Cervicalgia: Secondary | ICD-10-CM

## 2018-04-24 DIAGNOSIS — S134XXD Sprain of ligaments of cervical spine, subsequent encounter: Secondary | ICD-10-CM

## 2018-04-24 NOTE — Patient Instructions (Addendum)
Good to see you  You will get better  Xrays downstairs Stay active though but watch the lifting.  See me again in 2 weeks to hopefully fully clear you

## 2018-04-24 NOTE — Assessment & Plan Note (Signed)
X-rays ordered Patient though is making improvement.  Symptoms seem to be decreasing.  I believe the patient will continue to make improvement.  Will increase patient's lifting to 35 pound limit.  Continue the muscle relaxer at night.  Hold on the prednisone.  Follow-up again in 2 weeks

## 2018-05-07 NOTE — Progress Notes (Signed)
Subjective:   I, [name], am serving as a scribe for Dr. Hulan Saas.    Chief Complaint: Matthew [name], DOB: [birth year], is a 52 y.o. male who presents for head injury. He has been doing well. Is on 25# lifting restriction which has helped alleviate his pain. Does still get headaches when he strains his neck that tend to radiate into base of skull.     History of Present Illness:    Concussion Self-Reported Symptom Score Symptoms rated on a scale 1-6, in last 24 hours Symptom score of 10 today. Sleeping less than usual, trouble falling asleep, and off balance.   Review of Systems: Pertinent items are noted in HPI.  Review of History: Past Medical History:  Past Medical History:  Diagnosis Date  . Anxiety   . Depression   . Diabetes mellitus   . GERD (gastroesophageal reflux disease)   . H/O hiatal hernia    ?  Marland Kitchen Mental disorder   . OSA (obstructive sleep apnea)    Not wearing CPAP  . Parotid mass    rt  . Sleep apnea    just started on cpap 2 weeks  . Stones in the urinary tract     Past Surgical History:  has a past surgical history that includes Parotidectomy (07/04/2012) and Hematoma evacuation (07/04/2012). Family History: family history includes Cancer in his mother; Diabetes in his father; Mental illness in his mother; Stroke in his brother. no family history of autoimmune Social History:  reports that he quit smoking about 1 years ago. His smoking use included cigarettes. He has a 1.25 pack-year smoking history. He has quit using smokeless tobacco. He reports that he drinks alcohol. He reports that he has current or past drug history. Drug: Marijuana. Frequency: 1.00 time per week. Current Medications: has a current medication list which includes the following prescription(s): amitriptyline, aspirin, clonazepam, meloxicam, metformin, omeprazole, prednisone, rosuvastatin, and tramadol. Allergies: has No Known Allergies.  Objective:    Physical  Examination Vitals:   05/08/18 0802  BP: (!) 132/92  Pulse: (!) 111  SpO2: 91%   General: No apparent distress alert and oriented x3 mood and affect normal, dressed appropriately.  HEENT: Pupils equal, extraocular movements intact  Respiratory: Patient's speak in full sentences and does not appear short of breath  Cardiovascular: No lower extremity edema, non tender, no erythema  Skin: Warm dry intact with no signs of infection or rash on extremities or on axial skeleton.  Abdomen: Soft nontender  Neuro: Cranial nerves II through XII are intact, neurovascularly intact in all extremities with 2+ DTRs and 2+ pulses.  Lymph: No lymphadenopathy of posterior or anterior cervical chain or axillae bilaterally.  Gait normal with good balance and coordination.  MSK:  Non tender with full range of motion and good stability and symmetric strength and tone of shoulders, elbows, wrist,  knee and ankles bilaterally.  Psychiatric: Oriented X3, intact recent and remote memory, judgement and insight, normal mood and affect  Concussion testing performed today:   Balance Screen: 30/30     Assessment:    No diagnosis found.  Matthew [name] presents with the following concussion subtypes. [] Cognitive [] Cervical [] Vestibular [] Ocular [] Migraine [x] Anxiety/Mood   Plan:   Action/Discussion: Reviewed diagnosis, management options, expected outcomes, and the reasons for scheduled and emergent follow-up. Questions were adequately answered. Patient expressed verbal understanding and agreement with the following plan.   Patient is fully cleared.

## 2018-05-08 ENCOUNTER — Ambulatory Visit (INDEPENDENT_AMBULATORY_CARE_PROVIDER_SITE_OTHER): Payer: 59 | Admitting: Family Medicine

## 2018-05-08 DIAGNOSIS — S134XXD Sprain of ligaments of cervical spine, subsequent encounter: Secondary | ICD-10-CM | POA: Diagnosis not present

## 2018-05-08 NOTE — Patient Instructions (Signed)
Good to see you  You are doing great  Zanaflex as needed Mild limitation at work another 2 weeks Make an appointment in 4 weeks but if you are fine cancel and see me when you need me

## 2018-05-08 NOTE — Assessment & Plan Note (Signed)
Whiplash injury seems to be improving.  Increase weight limit to 40 pounds for the next 2 weeks and then fully released.  If any worsening symptoms or not completely resolved in the next month patient will return otherwise can follow-up as needed

## 2018-06-05 ENCOUNTER — Other Ambulatory Visit: Payer: Self-pay | Admitting: Family Medicine

## 2018-06-05 NOTE — Telephone Encounter (Signed)
Refill done.  
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
## 2018-06-24 ENCOUNTER — Encounter: Payer: Self-pay | Admitting: *Deleted

## 2018-06-24 ENCOUNTER — Ambulatory Visit (INDEPENDENT_AMBULATORY_CARE_PROVIDER_SITE_OTHER): Payer: 59 | Admitting: Family Medicine

## 2018-06-24 ENCOUNTER — Encounter: Payer: Self-pay | Admitting: Family Medicine

## 2018-06-24 VITALS — BP 126/80 | HR 95 | Temp 98.6°F | Resp 12 | Ht 71.0 in | Wt 278.5 lb

## 2018-06-24 DIAGNOSIS — F419 Anxiety disorder, unspecified: Secondary | ICD-10-CM | POA: Diagnosis not present

## 2018-06-24 DIAGNOSIS — Z23 Encounter for immunization: Secondary | ICD-10-CM

## 2018-06-24 DIAGNOSIS — G4700 Insomnia, unspecified: Secondary | ICD-10-CM | POA: Diagnosis not present

## 2018-06-24 DIAGNOSIS — E119 Type 2 diabetes mellitus without complications: Secondary | ICD-10-CM | POA: Diagnosis not present

## 2018-06-24 DIAGNOSIS — E7800 Pure hypercholesterolemia, unspecified: Secondary | ICD-10-CM

## 2018-06-24 DIAGNOSIS — M1990 Unspecified osteoarthritis, unspecified site: Secondary | ICD-10-CM

## 2018-06-24 DIAGNOSIS — G894 Chronic pain syndrome: Secondary | ICD-10-CM

## 2018-06-24 DIAGNOSIS — R7401 Elevation of levels of liver transaminase levels: Secondary | ICD-10-CM

## 2018-06-24 DIAGNOSIS — R740 Nonspecific elevation of levels of transaminase and lactic acid dehydrogenase [LDH]: Secondary | ICD-10-CM

## 2018-06-24 LAB — POCT GLYCOSYLATED HEMOGLOBIN (HGB A1C): Hemoglobin A1C: 6 % — AB (ref 4.0–5.6)

## 2018-06-24 MED ORDER — AMITRIPTYLINE HCL 100 MG PO TABS: ORAL_TABLET | ORAL | 1 refills | Status: DC

## 2018-06-24 MED ORDER — CLONAZEPAM 2 MG PO TABS: ORAL_TABLET | ORAL | 3 refills | Status: DC

## 2018-06-24 MED ORDER — ROSUVASTATIN CALCIUM 10 MG PO TABS: 10.0000 mg | ORAL_TABLET | Freq: Every day | ORAL | 2 refills | Status: DC

## 2018-06-24 MED ORDER — METFORMIN HCL 1000 MG PO TABS: ORAL_TABLET | ORAL | 2 refills | Status: DC

## 2018-06-24 MED ORDER — TRAMADOL HCL 50 MG PO TABS: ORAL_TABLET | ORAL | 3 refills | Status: DC

## 2018-06-24 MED ORDER — MELOXICAM 15 MG PO TABS: 15.0000 mg | ORAL_TABLET | Freq: Every day | ORAL | 1 refills | Status: DC

## 2018-06-24 NOTE — Assessment & Plan Note (Signed)
Problem is well controlled with amitriptyline 100 mg at bedtime. Good sleep hygiene is also recommended. No changes in current management. Follow-up in 5 months, before if needed.

## 2018-06-24 NOTE — Assessment & Plan Note (Signed)
[place] controlled substance website was reviewed. Med contract renewed today. Urine tox was ordered today. We discussed current recommendations in regard to chronic opioid/benzo use. Follow-up in 5 months, before if needed.

## 2018-06-24 NOTE — Assessment & Plan Note (Signed)
Pain is well controlled on current management. We discussed some side effects of hydrocodone and chronic use of NSAIDs. No changes in current management. Weight loss will also help. Follow-up in 5 months, before if needed.

## 2018-06-24 NOTE — Assessment & Plan Note (Signed)
Stable. No changes in clonazepam or amitriptyline. Follow-up in 5 months, before if needed.

## 2018-06-24 NOTE — Patient Instructions (Addendum)
A few things to remember from today's visit:   Type 2 diabetes mellitus without complication, without long-term current use of insulin ([place]) - Plan: POCT glycosylated hemoglobin (Hb A1C), Microalbumin / creatinine urine ratio, Comprehensive metabolic panel, metFORMIN (GLUCOPHAGE) 1000 MG tablet  Osteoarthritis, unspecified osteoarthritis type, unspecified site - Plan: traMADol (ULTRAM) 50 MG tablet, meloxicam (MOBIC) 15 MG tablet  Anxiety disorder, unspecified type - Plan: clonazePAM (KLONOPIN) 2 MG tablet  Insomnia, unspecified type - Plan: amitriptyline (ELAVIL) 100 MG tablet  Chronic pain disorder - Plan: ToxASSURE Select 13 (MW), Urine, traMADol (ULTRAM) 50 MG tablet, meloxicam (MOBIC) 15 MG tablet  Elevated ALT measurement - Plan: Comprehensive metabolic panel  Pure hypercholesterolemia - Plan: rosuvastatin (CRESTOR) 10 MG tablet   Please be sure medication list is accurate. If a new problem present, please set up appointment sooner than planned today.

## 2018-06-24 NOTE — Assessment & Plan Note (Signed)
HgA1C at goal. No changes in current management. Regular exercise and healthy diet with avoidance of added sugar food intake is an important part of treatment and recommended. Annual eye exam, periodic dental and foot care recommended. F/U in 5-6 months  

## 2018-06-24 NOTE — Assessment & Plan Note (Signed)
?    Fatty liver. No changes in current medications. We will continue following.

## 2018-06-28 ENCOUNTER — Other Ambulatory Visit: Payer: Self-pay | Admitting: Family Medicine

## 2018-06-28 DIAGNOSIS — K219 Gastro-esophageal reflux disease without esophagitis: Secondary | ICD-10-CM

## 2018-07-08 ENCOUNTER — Other Ambulatory Visit: Payer: Self-pay | Admitting: Family Medicine

## 2018-08-05 ENCOUNTER — Other Ambulatory Visit: Payer: Self-pay | Admitting: Family Medicine

## 2018-08-27 ENCOUNTER — Other Ambulatory Visit: Payer: Self-pay | Admitting: Family Medicine

## 2018-09-11 ENCOUNTER — Encounter: Payer: Self-pay | Admitting: *Deleted

## 2018-09-11 ENCOUNTER — Ambulatory Visit (INDEPENDENT_AMBULATORY_CARE_PROVIDER_SITE_OTHER): Payer: 59 | Admitting: Family Medicine

## 2018-09-11 ENCOUNTER — Encounter: Payer: Self-pay | Admitting: Family Medicine

## 2018-09-11 VITALS — BP 118/80 | HR 93 | Temp 98.5°F | Resp 12 | Ht 71.0 in | Wt 281.4 lb

## 2018-09-11 DIAGNOSIS — L02511 Cutaneous abscess of right hand: Secondary | ICD-10-CM

## 2018-09-11 MED ORDER — SULFAMETHOXAZOLE-TRIMETHOPRIM 800-160 MG PO TABS: 1.0000 | ORAL_TABLET | Freq: Two times a day (BID) | ORAL | 0 refills | Status: AC

## 2018-09-11 NOTE — Progress Notes (Signed)
ACUTE VISIT   HPI:  Chief Complaint  Patient presents with  . Infected finger    woke up Sunday morning and right index finger was red, got worse over the next 2 days, pus came out on yesterday     [name] is a 53 y.o. male, who is here today complaining of finger pain,edema,and erythema as described above. No Hx of trauma. He denies fever,chills,bady aches,or fatigue.  It was getting worse until yesterday when spontaneously drained purulent material.  He has not tried OTC treatments.  Pain is throbbing,severe,constant.It is exacerbated by contact/palpation. Alleviated by elevation and rest.   Hx of DM II, BS are "good."   Review of Systems  Constitutional: Negative for chills, fatigue and fever.  Respiratory: Negative for shortness of breath and wheezing.   Cardiovascular: Negative for chest pain.  Musculoskeletal: Positive for arthralgias. Negative for neck pain.  Skin: Negative for pallor.  Neurological: Negative for weakness, numbness and headaches.      Current Outpatient Medications on File Prior to Visit  Medication Sig Dispense Refill  . amitriptyline (ELAVIL) 100 MG tablet TAKE 1 TABLET BY MOUTH EVERYDAY AT BEDTIME 90 tablet 1  . aspirin 81 MG tablet Take 81 mg by mouth daily.    . clonazePAM (KLONOPIN) 2 MG tablet TAKE 1 TABLET BY MOUTH EVERY DAY FOR ANXIETY prn 30 tablet 3  . meloxicam (MOBIC) 15 MG tablet Take 1 tablet (15 mg total) by mouth daily. 90 tablet 1  . metFORMIN (GLUCOPHAGE) 1000 MG tablet TAKE 1 TABLET BY MOUTH TWICE A DAY WITH A MEAL 180 tablet 2  . omeprazole (PRILOSEC) 20 MG capsule TAKE 1 CAPSULE BY MOUTH EVERY DAY 90 capsule 3  . rosuvastatin (CRESTOR) 10 MG tablet Take 1 tablet (10 mg total) by mouth daily. 90 tablet 2  . tiZANidine (ZANAFLEX) 4 MG tablet TAKE 1 TABLET BY MOUTH NIGTHLY FOR 10 DAYS 30 tablet 1  . traMADol (ULTRAM) 50 MG tablet TAKE 1 TABLET BY MOUTH EVERY 12 HOURS AS NEEDED FOR MODERATE PAIN 60 tablet 3     No current facility-administered medications on file prior to visit.      Past Medical History:  Diagnosis Date  . Anxiety   . Depression   . Diabetes mellitus   . GERD (gastroesophageal reflux disease)   . H/O hiatal hernia    ?  Marland Kitchen Mental disorder   . OSA (obstructive sleep apnea)    Not wearing CPAP  . Parotid mass    rt  . Sleep apnea    just started on cpap 2 weeks  . Stones in the urinary tract    No Known Allergies  Social History   Socioeconomic History  . Marital status: Single    Spouse name: Not on file  . Number of children: Not on file  . Years of education: Not on file  . Highest education level: Not on file  Occupational History  . Not on file  Social Needs  . Financial resource strain: Not on file  . Food insecurity:    Worry: Not on file    Inability: Not on file  . Transportation needs:    Medical: Not on file    Non-medical: Not on file  Tobacco Use  . Smoking status: Former Smoker    Packs/day: 0.25    Years: 5.00    Pack years: 1.25    Types: Cigarettes    Last attempt to quit: 05/28/2016  Years since quitting: 2.2  . Smokeless tobacco: Former Systems developer  . Tobacco comment: marijuana 06/26/12  occ social drinker  Substance and Sexual Activity  . Alcohol use: Yes    Comment: monthly  . Drug use: Yes    Frequency: 1.0 times per week    Types: Marijuana    Comment: denies recent use  . Sexual activity: Not on file  Lifestyle  . Physical activity:    Days per week: Not on file    Minutes per session: Not on file  . Stress: Not on file  Relationships  . Social connections:    Talks on phone: Not on file    Gets together: Not on file    Attends religious service: Not on file    Active member of club or organization: Not on file    Attends meetings of clubs or organizations: Not on file    Relationship status: Not on file  Other Topics Concern  . Not on file  Social History Narrative  . Not on file    Vitals:   09/11/18 0943   BP: 118/80  Pulse: 93  Resp: 12  Temp: 98.5 F (36.9 C)  SpO2: 96%   Body mass index is 39.24 kg/m.   Physical Exam  Nursing note and vitals reviewed. Constitutional: He appears well-developed. No distress.  HENT:  Head: Normocephalic and atraumatic.  Mouth/Throat: Oropharynx is clear and moist and mucous membranes are normal.  Eyes: Conjunctivae are normal.  Cardiovascular: Normal rate and regular rhythm.  Pulses:      Radial pulses are 2+ on the right side.  Respiratory: Effort normal and breath sounds normal. No respiratory distress.  Musculoskeletal:        General: No edema.     Right hand: He exhibits tenderness and swelling. He exhibits normal range of motion, no bony tenderness and normal capillary refill. Normal strength noted.       Hands:  Lymphadenopathy:    He has no cervical adenopathy.  Skin: Skin is warm. Rash noted. There is erythema.  Right index finger with periungual erythema,ecchymosis and edema. Tender with palpation. Minimal yellowish drainage with pressing area. See picture.   Psychiatric: He has a normal mood and affect.  Well groomed, good eye contact.        ASSESSMENT AND PLAN:   [name] was seen today for infected finger.  Diagnoses and all orders for this visit:  Abscess of finger of right hand -     sulfamethoxazole-trimethoprim (BACTRIM DS,SEPTRA DS) 800-160 MG tablet; Take 1 tablet by mouth 2 (two) times daily for 7 days.   Oral abx started,side effects discussed. He has Hx of chronic pain and on Tramadol. Elevation. Recommend soaking finger in warm water with Epson salt a couple time per day. Clearly instructed about warning signs. F/U as needed.  Picture was taking after verbal consent.   Return if symptoms worsen or fail to improve, for Keep next appointment in 11/2018.      Matthew Hutmacher G. Martinique, MD  [facility]. [place] office.

## 2018-09-11 NOTE — Patient Instructions (Signed)
A few things to remember from today's visit:   Abscess of finger of right hand - Plan: sulfamethoxazole-trimethoprim (BACTRIM DS,SEPTRA DS) 800-160 MG tablet Soak finger in warm water with Epsom salt a couple times during the day. Elevation. Take antibiotic with food. Monitor for fever, chills, or worsening swelling/redness.  Please keep next follow-up appointment.  Please be sure medication list is accurate. If a new problem present, please set up appointment sooner than planned today.

## 2018-09-17 ENCOUNTER — Ambulatory Visit (INDEPENDENT_AMBULATORY_CARE_PROVIDER_SITE_OTHER): Payer: 59 | Admitting: Family Medicine

## 2018-09-17 ENCOUNTER — Encounter: Payer: Self-pay | Admitting: *Deleted

## 2018-09-17 ENCOUNTER — Encounter: Payer: Self-pay | Admitting: Family Medicine

## 2018-09-17 VITALS — BP 120/78 | HR 64 | Temp 98.1°F | Resp 12 | Ht 71.0 in | Wt 287.0 lb

## 2018-09-17 DIAGNOSIS — L02511 Cutaneous abscess of right hand: Secondary | ICD-10-CM | POA: Diagnosis not present

## 2018-09-17 DIAGNOSIS — L98491 Non-pressure chronic ulcer of skin of other sites limited to breakdown of skin: Secondary | ICD-10-CM

## 2018-09-17 NOTE — Progress Notes (Signed)
HPI:  Chief Complaint  Patient presents with  . Follow-up    infection of right index finger    Matthew [name] is a 53 y.o. male, who is here today to follow on recent OV.  He was seen on 09/11/2018 because of right index abscess. He was started on Bactrim DS twice daily, today he is he is fourth day of treatment. He has tolerated medication well, no side effects reported.  Finger was healing well until yesterday at work accidentally injured finger at [place] an ulcer. Pain is 6-7/10, it was 10/10.  In general erythema and edema have resolved. Lesion is not longer draining. He is not soaking his finger as instructed.  He has not noted fever but occasionally he has chills, he wonders if he is doing antibiotic. Negative for joint edema, erythema, or limitation of movement.   Review of Systems  Constitutional: Negative for chills, fatigue and fever.  Musculoskeletal: Positive for arthralgias.  Skin: Positive for wound. Negative for color change.  Neurological: Negative for weakness and numbness.  Psychiatric/Behavioral: Negative for confusion. The patient is nervous/anxious.      Current Outpatient Medications on File Prior to Visit  Medication Sig Dispense Refill  . amitriptyline (ELAVIL) 100 MG tablet TAKE 1 TABLET BY MOUTH EVERYDAY AT BEDTIME 90 tablet 1  . aspirin 81 MG tablet Take 81 mg by mouth daily.    . clonazePAM (KLONOPIN) 2 MG tablet TAKE 1 TABLET BY MOUTH EVERY DAY FOR ANXIETY prn 30 tablet 3  . meloxicam (MOBIC) 15 MG tablet Take 1 tablet (15 mg total) by mouth daily. 90 tablet 1  . metFORMIN (GLUCOPHAGE) 1000 MG tablet TAKE 1 TABLET BY MOUTH TWICE A DAY WITH A MEAL 180 tablet 2  . omeprazole (PRILOSEC) 20 MG capsule TAKE 1 CAPSULE BY MOUTH EVERY DAY 90 capsule 3  . rosuvastatin (CRESTOR) 10 MG tablet Take 1 tablet (10 mg total) by mouth daily. 90 tablet 2  . tiZANidine (ZANAFLEX) 4 MG tablet TAKE 1 TABLET BY MOUTH NIGTHLY FOR 10 DAYS 30 tablet 1   . traMADol (ULTRAM) 50 MG tablet TAKE 1 TABLET BY MOUTH EVERY 12 HOURS AS NEEDED FOR MODERATE PAIN 60 tablet 3   No current facility-administered medications on file prior to visit.      Past Medical History:  Diagnosis Date  . Anxiety   . Depression   . Diabetes mellitus   . GERD (gastroesophageal reflux disease)   . H/O hiatal hernia    ?  Marland Kitchen Mental disorder   . OSA (obstructive sleep apnea)    Not wearing CPAP  . Parotid mass    rt  . Sleep apnea    just started on cpap 2 weeks  . Stones in the urinary tract    No Known Allergies  Social History   Socioeconomic History  . Marital status: Single    Spouse name: Not on file  . Number of children: Not on file  . Years of education: Not on file  . Highest education level: Not on file  Occupational History  . Not on file  Social Needs  . Financial resource strain: Not on file  . Food insecurity:    Worry: Not on file    Inability: Not on file  . Transportation needs:    Medical: Not on file    Non-medical: Not on file  Tobacco Use  . Smoking status: Former Smoker    Packs/day: 0.25  Years: 5.00    Pack years: 1.25    Types: Cigarettes    Last attempt to quit: 05/28/2016    Years since quitting: 2.3  . Smokeless tobacco: Former Systems developer  . Tobacco comment: marijuana 06/26/12  occ social drinker  Substance and Sexual Activity  . Alcohol use: Yes    Comment: monthly  . Drug use: Yes    Frequency: 1.0 times per week    Types: Marijuana    Comment: denies recent use  . Sexual activity: Not on file  Lifestyle  . Physical activity:    Days per week: Not on file    Minutes per session: Not on file  . Stress: Not on file  Relationships  . Social connections:    Talks on phone: Not on file    Gets together: Not on file    Attends religious service: Not on file    Active member of club or organization: Not on file    Attends meetings of clubs or organizations: Not on file    Relationship status: Not on file    Other Topics Concern  . Not on file  Social History Narrative  . Not on file    Vitals:   09/17/18 0702  BP: 120/78  Pulse: 64  Resp: 12  Temp: 98.1 F (36.7 C)  SpO2: 97%   Body mass index is 40.03 kg/m.   Physical Exam  Nursing note and vitals reviewed. Constitutional: He appears well-developed. No distress.  HENT:  Head: Normocephalic and atraumatic.  Eyes: Conjunctivae are normal.  Cardiovascular: Normal rate and regular rhythm.  Pulses:      Radial pulses are 2+ on the right side.  Respiratory: Effort normal. No respiratory distress.  Musculoskeletal:     Right hand: He exhibits no bony tenderness, normal capillary refill and no deformity.  Skin: Skin is warm. No erythema.  Psychiatric: His mood appears anxious.  Well groomed, good eye contact.      ASSESSMENT AND PLAN:  Mr.  [name] was seen today for follow-up.  Diagnoses and all orders for this visit:  Abscess of right index finger  Skin ulcer of finger, limited to breakdown of skin ([place])   Improving. Complete abx treatment. Keep wound clean with soap and water.  Recommend soaking finger daily in warm water with salt.  Note for work given. Monitor for worsening pain or erythema.    Betty G. Martinique, MD  [facility]. [place] office.

## 2018-10-07 ENCOUNTER — Ambulatory Visit (INDEPENDENT_AMBULATORY_CARE_PROVIDER_SITE_OTHER): Payer: 59 | Admitting: Family Medicine

## 2018-10-07 ENCOUNTER — Encounter: Payer: Self-pay | Admitting: Family Medicine

## 2018-10-07 VITALS — BP 122/80 | HR 96 | Temp 98.3°F | Resp 12 | Ht 71.0 in | Wt 291.2 lb

## 2018-10-07 DIAGNOSIS — M6208 Separation of muscle (nontraumatic), other site: Secondary | ICD-10-CM | POA: Diagnosis not present

## 2018-10-07 DIAGNOSIS — K219 Gastro-esophageal reflux disease without esophagitis: Secondary | ICD-10-CM | POA: Diagnosis not present

## 2018-10-07 NOTE — Progress Notes (Signed)
ACUTE VISIT   HPI:  Chief Complaint  Patient presents with  . Hernia    [name] is a 53 y.o. male, who is here today complaining of having a "hernia" for years and wants to have it treated before he retires in a couple years.  Concern about protruding area in abdomen, it seems to be stable. Area of concern is more noticeable when he is getting up from supine position. Not noticeable when he is upright.  According to patient, he saw a surgeon a few years ago and he was told to lose weight.   He denies abdominal pain, nausea, vomiting, or changes in bowel habits. He has had problem for years, he has seen general surgeon, it was recommended to lose weight.  He thinks this is related with his history of hiatal hernia, asymptomatic. Negative for dysphagia, heartburn, or acid reflux. He is currently on omeprazole 20 mg daily.   Review of Systems  Constitutional: Negative for chills and fever.  HENT: Negative for sore throat and trouble swallowing.   Respiratory: Negative for cough and shortness of breath.   Gastrointestinal: Negative for blood in stool, nausea and vomiting.       No changes in bowel habits.  Genitourinary: Negative for decreased urine volume and hematuria.  Skin: Negative for color change and rash.  Psychiatric/Behavioral: Negative for confusion. The patient is nervous/anxious.       Current Outpatient Medications on File Prior to Visit  Medication Sig Dispense Refill  . amitriptyline (ELAVIL) 100 MG tablet TAKE 1 TABLET BY MOUTH EVERYDAY AT BEDTIME 90 tablet 1  . aspirin 81 MG tablet Take 81 mg by mouth daily.    . clonazePAM (KLONOPIN) 2 MG tablet TAKE 1 TABLET BY MOUTH EVERY DAY FOR ANXIETY prn 30 tablet 3  . meloxicam (MOBIC) 15 MG tablet Take 1 tablet (15 mg total) by mouth daily. 90 tablet 1  . metFORMIN (GLUCOPHAGE) 1000 MG tablet TAKE 1 TABLET BY MOUTH TWICE A DAY WITH A MEAL 180 tablet 2  . omeprazole (PRILOSEC) 20 MG capsule TAKE  1 CAPSULE BY MOUTH EVERY DAY 90 capsule 3  . rosuvastatin (CRESTOR) 10 MG tablet Take 1 tablet (10 mg total) by mouth daily. 90 tablet 2  . tiZANidine (ZANAFLEX) 4 MG tablet TAKE 1 TABLET BY MOUTH NIGTHLY FOR 10 DAYS 30 tablet 1  . traMADol (ULTRAM) 50 MG tablet TAKE 1 TABLET BY MOUTH EVERY 12 HOURS AS NEEDED FOR MODERATE PAIN 60 tablet 3   No current facility-administered medications on file prior to visit.      Past Medical History:  Diagnosis Date  . Anxiety   . Depression   . Diabetes mellitus   . GERD (gastroesophageal reflux disease)   . H/O hiatal hernia    ?  Marland Kitchen Mental disorder   . OSA (obstructive sleep apnea)    Not wearing CPAP  . Parotid mass    rt  . Sleep apnea    just started on cpap 2 weeks  . Stones in the urinary tract    No Known Allergies  Social History   Socioeconomic History  . Marital status: Single    Spouse name: Not on file  . Number of children: Not on file  . Years of education: Not on file  . Highest education level: Not on file  Occupational History  . Not on file  Social Needs  . Financial resource strain: Not on file  . Food  insecurity:    Worry: Not on file    Inability: Not on file  . Transportation needs:    Medical: Not on file    Non-medical: Not on file  Tobacco Use  . Smoking status: Former Smoker    Packs/day: 0.25    Years: 5.00    Pack years: 1.25    Types: Cigarettes    Last attempt to quit: 05/28/2016    Years since quitting: 2.3  . Smokeless tobacco: Former Systems developer  . Tobacco comment: marijuana 06/26/12  occ social drinker  Substance and Sexual Activity  . Alcohol use: Yes    Comment: monthly  . Drug use: Yes    Frequency: 1.0 times per week    Types: Marijuana    Comment: denies recent use  . Sexual activity: Not on file  Lifestyle  . Physical activity:    Days per week: Not on file    Minutes per session: Not on file  . Stress: Not on file  Relationships  . Social connections:    Talks on phone: Not on  file    Gets together: Not on file    Attends religious service: Not on file    Active member of club or organization: Not on file    Attends meetings of clubs or organizations: Not on file    Relationship status: Not on file  Other Topics Concern  . Not on file  Social History Narrative  . Not on file    Vitals:   10/07/18 0832  BP: 122/80  Pulse: 96  Resp: 12  Temp: 98.3 F (36.8 C)  SpO2: 97%   Body mass index is 40.62 kg/m.   Physical Exam  Nursing note and vitals reviewed. Constitutional: He is oriented to person, place, and time. He appears well-developed. No distress.  HENT:  Head: Normocephalic and atraumatic.  Mouth/Throat: Oropharynx is clear and moist and mucous membranes are normal.  Eyes: Conjunctivae and EOM are normal.  Cardiovascular: Normal rate and regular rhythm.  No murmur heard. Respiratory: Effort normal and breath sounds normal. No respiratory distress.  GI: Soft. He exhibits no distension and no mass. There is no hepatomegaly. There is no abdominal tenderness.    Mid line protruded area note with valsalva.  Musculoskeletal:        General: No edema.  Lymphadenopathy:    He has no cervical adenopathy.  Neurological: He is alert and oriented to person, place, and time. He has normal strength.  Skin: Skin is warm. No erythema.  Psychiatric: His mood appears anxious.  Well-groomed, good eye contact.    ASSESSMENT AND PLAN:   [name] was seen today for hernia.  Diagnoses and all orders for this visit:  Rectus diastasis of lower abdomen Educated about diagnosis. Reassured. Weight loss may help.  Gastroesophageal reflux disease without esophagitis History of small hiatal hernia, currently asymptomatic. No changes in omeprazole 20 mg. GERD precautions to continue.    Return if symptoms worsen or fail to improve, for Keep next follow-up appointment..      Jeryn Cerney G. [place], MD  [facility]. [place]  office.

## 2018-10-07 NOTE — Patient Instructions (Signed)
A few things to remember from today's visit:   Rectus diastasis of lower abdomen  Diastasis Recti  Diastasis recti is when the muscles of the abdomen (rectus abdominis muscles) become thin and separate. The result is a wider space between the right and left abdomen (abdominal) muscles. This wider space between the muscles may cause a bulge in the middle of your abdomen. You may notice this bulge when you are straining or when you sit up from a lying down position. Diastasis recti can affect men and women. It is most common among pregnant women, infants, people who are obese, and people who have had abdominal surgery. Exercise or surgical treatment may help correct it. What are the causes? Common causes of this condition include:  Pregnancy. The growing uterus puts pressure on the abdominal muscles, which causes the muscles to separate.  Obesity. Excess fat puts pressure on abdominal muscles.  Weightlifting.  Some abdomen exercises.  Advanced age.  Genetics.  Prior abdominal surgery. What increases the risk? This condition is more likely to develop in:  Women.  Newborns, especially newborns who are born early (prematurely). What are the signs or symptoms? Common symptoms of this condition include:  A bulge in the middle of the abdomen. You will notice it most when you sit up or strain.  Pain in the low back, pelvis, or hips.  Constipation.  Inability to control when you urinate (urinary incontinence).  Bloating.  Poor posture. How is this diagnosed? This condition is diagnosed with a physical exam. Your health care provider will ask you to lie flat on your back and do a crunch or half sit-up. If you have diastasis recti, a vertical bulge will appear between your abdominal muscles in the center of your abdomen. Your health care provider will measure the gap between your muscles with one of the following:  A medical device used to measure the space between two objects  (caliper).  A tape measure.  CT scan.  Ultrasound.  Finger spaces. Your health care provider will measure the space using their fingers. How is this treated? If your muscle separation is not too large, you may not need treatment. However, if you are a woman who plans to become pregnant again, you should treat this condition before your next pregnancy. Treatment may include:  Physical therapy to strengthen and tighten your abdominal muscles.  Lifestyle changes such as weight loss and exercise.  Over-the-counter pain medicines as needed.  Surgery to correct the separation. Follow these instructions at home: Activity  Return to your normal activities as told by your health care provider. Ask your health care provider what activities are safe for you.  When lifting weights or doing exercises using your abdominal muscles or the muscles in the center of your body that give stability (core muscles), make sure you are doing your exercises and movements correctly. Proper form can help to prevent the condition from happening again. General instructions  If you are overweight, ask your health care provider for help with weight loss. Losing even a small amount of weight can help to improve your diastasis recti.  Take over-the-counter or prescription medicines only as told by your health care provider.  Do not strain. Straining can make the separation worse. Examples of straining include: ? Pushing hard to have a bowel movement, such as due to constipation. ? Lifting heavy objects, including children. ? Standing up and sitting down.  Take steps to prevent constipation: ? Drink enough fluid to keep your urine  clear or pale yellow. ? Take over-the-counter or prescription medicines only as directed. ? Eat foods that are high in fiber, such as fresh fruits and vegetables, whole grains, and beans. ? Limit foods that are high in fat and processed sugars, such as fried and sweet foods. Contact a  health care provider if:  You notice a new bulge in your abdomen. Get help right away if:  You experience severe discomfort in your abdomen.  You develop severe abdominal pain along with nausea, vomiting, or fever. Summary  Diastasis recti is when the abdomen (abdominal) muscles become thin and separate. Your abdomen will stick out because the space between your right and left abdomen muscles has widened.  The most common symptom is a bulge in your abdomen. You will notice it most when you sit up or are straining.  This condition is diagnosed during a physical exam.  If the abdomen separation is not too big, you may choose not to have treatment. Otherwise, you may need to undergo physical therapy or surgery. This information is not intended to replace advice given to you by your health care provider. Make sure you discuss any questions you have with your health care provider. Document Released: 09/25/2016 Document Revised: 09/25/2016 Document Reviewed: 09/25/2016 Elsevier Interactive Patient Education  2019 [employer].   Please be sure medication list is accurate. If a new problem present, please set up appointment sooner than planned today.

## 2018-10-11 ENCOUNTER — Encounter: Payer: Self-pay | Admitting: Family Medicine

## 2018-10-27 ENCOUNTER — Other Ambulatory Visit: Payer: Self-pay | Admitting: Family Medicine

## 2018-11-21 ENCOUNTER — Other Ambulatory Visit: Payer: Self-pay | Admitting: Family Medicine

## 2018-11-21 DIAGNOSIS — F419 Anxiety disorder, unspecified: Secondary | ICD-10-CM

## 2018-11-21 DIAGNOSIS — M1990 Unspecified osteoarthritis, unspecified site: Secondary | ICD-10-CM

## 2018-11-21 DIAGNOSIS — G894 Chronic pain syndrome: Secondary | ICD-10-CM

## 2018-11-21 NOTE — Telephone Encounter (Signed)
Left message to return call to the office to schedule Doxy[facility] visit for a medication follow-up.

## 2018-11-25 ENCOUNTER — Other Ambulatory Visit: Payer: Self-pay | Admitting: Family Medicine

## 2018-11-25 NOTE — Telephone Encounter (Signed)
Left message to call back for a virtual visit for rx refill.

## 2018-11-26 ENCOUNTER — Encounter: Payer: Self-pay | Admitting: Family Medicine

## 2018-11-26 ENCOUNTER — Other Ambulatory Visit: Payer: Self-pay | Admitting: Family Medicine

## 2018-11-26 ENCOUNTER — Ambulatory Visit (INDEPENDENT_AMBULATORY_CARE_PROVIDER_SITE_OTHER): Payer: 59 | Admitting: Family Medicine

## 2018-11-26 ENCOUNTER — Other Ambulatory Visit: Payer: Self-pay

## 2018-11-26 VITALS — HR 60 | Resp 12

## 2018-11-26 DIAGNOSIS — F419 Anxiety disorder, unspecified: Secondary | ICD-10-CM | POA: Diagnosis not present

## 2018-11-26 DIAGNOSIS — R740 Nonspecific elevation of levels of transaminase and lactic acid dehydrogenase [LDH]: Secondary | ICD-10-CM

## 2018-11-26 DIAGNOSIS — M1990 Unspecified osteoarthritis, unspecified site: Secondary | ICD-10-CM | POA: Diagnosis not present

## 2018-11-26 DIAGNOSIS — E119 Type 2 diabetes mellitus without complications: Secondary | ICD-10-CM

## 2018-11-26 DIAGNOSIS — G894 Chronic pain syndrome: Secondary | ICD-10-CM | POA: Diagnosis not present

## 2018-11-26 DIAGNOSIS — R7401 Elevation of levels of liver transaminase levels: Secondary | ICD-10-CM

## 2018-11-26 MED ORDER — TIZANIDINE HCL 4 MG PO TABS: ORAL_TABLET | ORAL | 1 refills | Status: DC

## 2018-11-26 NOTE — Assessment & Plan Note (Signed)
Overall pain is adequately controlled with Mobic and tramadol. We discussed some side effects of medications. No changes in current management. Follow-up in 5 months.

## 2018-11-26 NOTE — Progress Notes (Signed)
Virtual Visit via Video Note   I connected with Mr Brodhead on 11/26/18 at  8:00 AM EDT by a video enabled telemedicine application and verified that I am speaking with the correct person using two identifiers.  Location patient: home Location provider:home office Persons participating in the virtual visit: patient, provider  I discussed the limitations of evaluation and management by telemedicine and the availability of in person appointments. The patient expressed understanding and agreed to proceed.   HPI: Mr Brinegar is a 53 yo male seen today for chronic disease management.  Last follow up on 06/24/2018.  Diabetes Mellitus II:  Currently on Metformin 1000 mg bid. Problem has been well controlled.  He is tolerating medications well. He denies abdominal pain, nausea, vomiting, polydipsia, polyuria, or polyphagia. No numbness, tingling, or burning.  He does not check BS.  Lab Results  Component Value Date   HGBA1C 6.0 (A) 06/24/2018   Lab Results  Component Value Date   MICROALBUR <0.7 07/31/2017    Lab Results  Component Value Date   CREATININE 0.98 02/01/2018   BUN 16 02/01/2018   NA 141 02/01/2018   K 4.4 02/01/2018   CL 103 02/01/2018   CO2 28 02/01/2018   HLD He has been more consistent with taking Crestor 10 mg. He is also following low fat diet.  Lab Results  Component Value Date   CHOL 180 02/01/2018   HDL 40.00 02/01/2018   LDLCALC 40 01/31/2017   LDLDIRECT 81.0 02/01/2018   TRIG 276.0 (H) 02/01/2018   CHOLHDL 5 02/01/2018   Anxiety and derpession , he is on Clonazepam 2 mg daily prn and Amitriptyline 100 mg at bedtime. The latter also helps with insomnia,sleeps about 7-8 hours. He feels rested next day. Tolerating meds well. He has taken these for years and no side effects reported. He denies depressed mood or suicidal thoughts.  Chronic pain, he is on Tramadol 50 mg bid prn and Mobic 15 mg daily prn. Neck pain,he takes Zanaflex 4 mg daily prn,  requesting refills.  Bilateral knee and left ankle/foot pain. Pain is exacerbated  By prolonged walk and standing,both happen while working. Medications still helping with pain,when taken pain is 0/10. Pain can be severe with limitation of daily activities if he does not take medications.   Liver enz mildly elevated last time, he did not have labs done. Lab Results  Component Value Date   ALT 64 (H) 02/01/2018   AST 36 02/01/2018   ALKPHOS 73 02/01/2018   BILITOT 0.4 02/01/2018  Denies abdominal pain,nausea,vomiting,changes in bowel habits,or jaundice. Negative for high alcohol intake.  ROS: See pertinent positives and negatives per HPI.  Past Medical History:  Diagnosis Date  . Anxiety   . Depression   . Diabetes mellitus   . GERD (gastroesophageal reflux disease)   . H/O hiatal hernia    ?  Marland Kitchen Mental disorder   . OSA (obstructive sleep apnea)    Not wearing CPAP  . Parotid mass    rt  . Sleep apnea    just started on cpap 2 weeks  . Stones in the urinary tract     Past Surgical History:  Procedure Laterality Date  . HEMATOMA EVACUATION  07/04/2012   Procedure: EVACUATION HEMATOMA;  Surgeon: Melida Quitter, MD;  [facility];  Service: [facility];  Laterality: Right;  . PAROTIDECTOMY  07/04/2012   Procedure: PAROTIDECTOMY;  Surgeon: Melida Quitter, MD;  Location: [facility];  Service: ENT;  Laterality: Right;  right  parotidectomy    Family History  Problem Relation Age of Onset  . Cancer Mother        breast  . Mental illness Mother   . Diabetes Father   . Stroke Brother     Social History   Socioeconomic History  . Marital status: Single    Spouse name: Not on file  . Number of children: Not on file  . Years of education: Not on file  . Highest education level: Not on file  Occupational History  . Not on file  Social Needs  . Financial resource strain: Not on file  . Food insecurity:    Worry: Not on file    Inability: Not on file  . Transportation needs:     Medical: Not on file    Non-medical: Not on file  Tobacco Use  . Smoking status: Former Smoker    Packs/day: 0.25    Years: 5.00    Pack years: 1.25    Types: Cigarettes    Last attempt to quit: 05/28/2016    Years since quitting: 2.4  . Smokeless tobacco: Former Systems developer  . Tobacco comment: marijuana 06/26/12  occ social drinker  Substance and Sexual Activity  . Alcohol use: Yes    Comment: monthly  . Drug use: Yes    Frequency: 1.0 times per week    Types: Marijuana    Comment: denies recent use  . Sexual activity: Not on file  Lifestyle  . Physical activity:    Days per week: Not on file    Minutes per session: Not on file  . Stress: Not on file  Relationships  . Social connections:    Talks on phone: Not on file    Gets together: Not on file    Attends religious service: Not on file    Active member of club or organization: Not on file    Attends meetings of clubs or organizations: Not on file    Relationship status: Not on file  . Intimate partner violence:    Fear of current or ex partner: Not on file    Emotionally abused: Not on file    Physically abused: Not on file    Forced sexual activity: Not on file  Other Topics Concern  . Not on file  Social History Narrative  . Not on file      Current Outpatient Medications:  .  amitriptyline (ELAVIL) 100 MG tablet, TAKE 1 TABLET BY MOUTH EVERYDAY AT BEDTIME, Disp: 90 tablet, Rfl: 1 .  aspirin 81 MG tablet, Take 81 mg by mouth daily., Disp: , Rfl:  .  clonazePAM (KLONOPIN) 2 MG tablet, TAKE 1 TABLET BY MOUTH EVERY DAY FOR ANXIETY AS NEEDED, Disp: 30 tablet, Rfl: 3 .  meloxicam (MOBIC) 15 MG tablet, TAKE 1 TABLET BY MOUTH EVERY DAY, Disp: 90 tablet, Rfl: 1 .  metFORMIN (GLUCOPHAGE) 1000 MG tablet, TAKE 1 TABLET BY MOUTH TWICE A DAY WITH A MEAL, Disp: 180 tablet, Rfl: 2 .  omeprazole (PRILOSEC) 20 MG capsule, TAKE 1 CAPSULE BY MOUTH EVERY DAY, Disp: 90 capsule, Rfl: 3 .  rosuvastatin (CRESTOR) 10 MG tablet, Take 1  tablet (10 mg total) by mouth daily., Disp: 90 tablet, Rfl: 2 .  tiZANidine (ZANAFLEX) 4 MG tablet, TAKE 1 TABLET BY MOUTH NIGTHLY prn, Disp: 30 tablet, Rfl: 1 .  traMADol (ULTRAM) 50 MG tablet, TAKE 1 TABLET BY MOUTH EVERY 12 HOURS AS NEEDED FOR MODERATE PAIN, Disp: 60 tablet, Rfl: 3  EXAM:  VITALS per patient if applicable:Pulse 60   Resp 12   GENERAL: alert, oriented, appears well and in no acute distress  HEENT: atraumatic, conjunctiva clear, no obvious facial abnormalities on inspection.  NECK: normal movements of the head and neck  LUNGS: on inspection no signs of respiratory distress, breathing rate appears normal, no obvious gross SOB, gasping or wheezing  CV: no obvious cyanosis  MS: moves all visible extremities without noticeable abnormality  PSYCH/NEURO: pleasant and cooperative, no obvious depression or anxiety, speech and thought processing grossly intact.  ASSESSMENT AND PLAN:  Discussed the following assessment and plan:   Type 2 diabetes mellitus ([place]) Problem has been well controlled. No changes in metformin 1000 mg twice daily. We will plan on checking A1c visit. Continue good foot care and periodic eye exams. Follow-up in 5 months.  OA (osteoarthritis) Overall pain is adequately controlled with Mobic and tramadol. We discussed some side effects of medications. No changes in current management. Follow-up in 5 months.  Anxiety disorder Problem is well controlled. No changes in amitriptyline 100 mg at bedtime or clonazepam 2 mg daily. Reviewed some side effects of medication.   Chronic pain disorder Pain adequately controlled. Med contract signed in 01/2017, we will plan on signing a new one next visit.   Elevated ALT measurement Mild and asymptomatic. CMP ordered a few months ago was not done. Due to current public health concerns about coronavirus, I think we can hold for labs until next OV.   He understands we are opting to hold on lab work  because current  public health concerns in regard to [place].    I discussed the assessment and treatment plan with the patient. He was provided an opportunity to ask questions and all were answered. The patient agreed with the plan and demonstrated an understanding of the instructions.    Return in about 5 months (around 04/28/2019) for pain,DM II,HLD,anxiety.    Lillyen Schow [place], MD

## 2018-11-26 NOTE — Assessment & Plan Note (Signed)
Mild and asymptomatic. CMP ordered a few months ago was not done. Due to current public health concerns about coronavirus, I think we can hold for labs until next OV.

## 2018-11-26 NOTE — Assessment & Plan Note (Signed)
Pain adequately controlled. Med contract signed in 01/2017, we will plan on signing a new one next visit.

## 2018-11-26 NOTE — Assessment & Plan Note (Signed)
Problem is well controlled. No changes in amitriptyline 100 mg at bedtime or clonazepam 2 mg daily. Reviewed some side effects of medication.

## 2018-11-26 NOTE — Assessment & Plan Note (Signed)
Problem has been well controlled. No changes in metformin 1000 mg twice daily. We will plan on checking A1c visit. Continue good foot care and periodic eye exams. Follow-up in 5 months.

## 2018-11-27 NOTE — Telephone Encounter (Signed)
Left message for patient to call back for virtual visit to discuss refill request.

## 2018-12-19 ENCOUNTER — Other Ambulatory Visit: Payer: Self-pay | Admitting: Family Medicine

## 2019-01-15 ENCOUNTER — Other Ambulatory Visit: Payer: Self-pay | Admitting: Family Medicine

## 2019-04-15 ENCOUNTER — Other Ambulatory Visit: Payer: Self-pay | Admitting: Family Medicine

## 2019-05-07 ENCOUNTER — Other Ambulatory Visit: Payer: Self-pay | Admitting: Family Medicine

## 2019-05-07 DIAGNOSIS — G4700 Insomnia, unspecified: Secondary | ICD-10-CM

## 2019-05-07 DIAGNOSIS — G894 Chronic pain syndrome: Secondary | ICD-10-CM

## 2019-05-07 DIAGNOSIS — K219 Gastro-esophageal reflux disease without esophagitis: Secondary | ICD-10-CM

## 2019-05-07 DIAGNOSIS — M1990 Unspecified osteoarthritis, unspecified site: Secondary | ICD-10-CM

## 2019-05-07 DIAGNOSIS — F419 Anxiety disorder, unspecified: Secondary | ICD-10-CM

## 2019-05-07 NOTE — Telephone Encounter (Signed)
Copied from [place] [id number]. Topic: Quick Communication - Rx Refill/Question >> May 07, 2019  8:48 AM Leward Quan A wrote: Medication: amitriptyline (ELAVIL) 100 MG tablet, clonazePAM (KLONOPIN) 2 MG tablet, meloxicam (MOBIC) 15 MG tablet, omeprazole (PRILOSEC) 20 MG capsule, rosuvastatin (CRESTOR) 10 MG tablet, tiZANidine (ZANAFLEX) 4 MG tablet, traMADol (ULTRAM) 50 MG tablet   Has the patient contacted their pharmacy? Yes.   (Agent: If no, request that the patient contact the pharmacy for the refill.) (Agent: If yes, when and what did the pharmacy advise?)  Preferred Pharmacy (with phone number or street name): [id number] - [place], [place] - [address] [phone] (Phone) [phone] (Fax)    Agent: Please be advised that RX refills may take up to 3 business days. We ask that you follow-up with your pharmacy.

## 2019-05-07 NOTE — Telephone Encounter (Signed)
Requested medication (s) are due for refill today: yes  Requested medication (s) are on the active medication list: yes   Future visit scheduled: no  Notes to clinic:  Review for refill   Requested Prescriptions  Pending Prescriptions Disp Refills   clonazePAM (KLONOPIN) 2 MG tablet 30 tablet 3    Sig: TAKE 1 TABLET BY MOUTH EVERY DAY FOR ANXIETY AS NEEDED     Not Delegated - Psychiatry:  Anxiolytics/Hypnotics Failed - 05/07/2019  8:58 AM      Failed - This refill cannot be delegated      Failed - Urine Drug Screen completed in last 360 days.      Passed - Valid encounter within last 6 months    Recent Outpatient Visits          5 months ago Type 2 diabetes mellitus without complication, without long-term current use of insulin ([place])   [facility], Malka So, MD   7 months ago Rectus diastasis of lower abdomen   [employer] [facility], Malka So, MD   7 months ago Abscess of right index finger   [facility], Malka So, MD   7 months ago Abscess of finger of right hand   [facility], Malka So, MD   10 months ago Type 2 diabetes mellitus without complication, without long-term current use of insulin ([place])   [place] [facility], Malka So, MD              amitriptyline (ELAVIL) 100 MG tablet 90 tablet 1    Sig: TAKE 1 TABLET BY MOUTH EVERYDAY AT BEDTIME     Psychiatry:  Antidepressants - Heterocyclics (TCAs) Passed - 05/07/2019  8:58 AM      Passed - Valid encounter within last 6 months    Recent Outpatient Visits          5 months ago Type 2 diabetes mellitus without complication, without long-term current use of insulin ([place])   [place] [facility], Malka So, MD   7 months ago Rectus diastasis of lower abdomen   [facility], Malka So, MD   7 months ago Abscess of right index finger   [facility], Malka So, MD   7 months ago Abscess of finger of right hand   [facility], Malka So, MD   10 months ago Type 2 diabetes mellitus without complication, without long-term current use of insulin ([place])   [place] [facility], Malka So, MD             Passed - Completed PHQ-2 or PHQ-9 in the last 360 days.       meloxicam (MOBIC) 15 MG tablet 90 tablet 1    Sig: Take 1 tablet (15 mg total) by mouth daily.     Analgesics:  COX2 Inhibitors Failed - 05/07/2019  8:58 AM      Failed - HGB in normal range and within 360 days    Hemoglobin  Date Value Ref Range Status  05/25/2014 17.8 (H) 13.0 - 17.0 g/dL Final         Failed - Cr in normal range and within 360 days    Creatinine, Ser  Date Value Ref Range Status  02/01/2018 0.98 0.40 - 1.50 mg/dL Final         Passed - Patient is not pregnant  Passed - Valid encounter within last 12 months    Recent Outpatient Visits          5 months ago Type 2 diabetes mellitus without complication, without long-term current use of insulin ([place])   [facility], Malka So, MD   7 months ago Rectus diastasis of lower abdomen   [facility], Malka So, MD   7 months ago Abscess of right index finger   [facility], Malka So, MD   7 months ago Abscess of finger of right hand   [facility], Malka So, MD   10 months ago Type 2 diabetes mellitus without complication, without long-term current use of insulin ([employer])   [facility], Malka So, MD              omeprazole (PRILOSEC) 20 MG capsule      Sig: TAKE 1 [place]     Gastroenterology: Proton Pump Inhibitors Passed - 05/07/2019  8:58 AM      Passed - Valid encounter within last 12 months    Recent Outpatient Visits          5 months ago Type 2 diabetes mellitus without  complication, without long-term current use of insulin ([place])   [facility], Malka So, MD   7 months ago Rectus diastasis of lower abdomen   [facility], Malka So, MD   7 months ago Abscess of right index finger   [facility], Malka So, MD   7 months ago Abscess of finger of right hand   [facility], Malka So, MD   10 months ago Type 2 diabetes mellitus without complication, without long-term current use of insulin ([place])   [place] [facility], Malka So, MD              tiZANidine (ZANAFLEX) 4 MG tablet 90 tablet 0     Not Delegated - Cardiovascular:  Alpha-2 Agonists - tizanidine Failed - 05/07/2019  8:58 AM      Failed - This refill cannot be delegated      Passed - Valid encounter within last 6 months    Recent Outpatient Visits          5 months ago Type 2 diabetes mellitus without complication, without long-term current use of insulin ([place])   [place] [facility], Malka So, MD   7 months ago Rectus diastasis of lower abdomen   [facility], Malka So, MD   7 months ago Abscess of right index finger   [facility], Malka So, MD   7 months ago Abscess of finger of right hand   [facility], Malka So, MD   10 months ago Type 2 diabetes mellitus without complication, without long-term current use of insulin ([place])   [place] [facility], Malka So, MD              traMADol (ULTRAM) 50 MG tablet 60 tablet 3     Not Delegated - Analgesics:  Opioid Agonists Failed - 05/07/2019  8:58 AM      Failed - This refill cannot be delegated      Failed - Urine Drug Screen completed in last 360 days.      Passed - Valid encounter within last 6 months  Recent Outpatient Visits          5 months ago Type 2 diabetes mellitus without complication,  without long-term current use of insulin ([place])   [facility], Malka So, MD   7 months ago Rectus diastasis of lower abdomen   [employer] [facility], Malka So, MD   7 months ago Abscess of right index finger   [facility], Malka So, MD   7 months ago Abscess of finger of right hand   [facility], Malka So, MD   10 months ago Type 2 diabetes mellitus without complication, without long-term current use of insulin ([place])   [place] [facility], Malka So, MD

## 2019-05-12 MED ORDER — AMITRIPTYLINE HCL 100 MG PO TABS: ORAL_TABLET | ORAL | 1 refills | Status: DC

## 2019-05-12 MED ORDER — OMEPRAZOLE 20 MG PO CPDR: DELAYED_RELEASE_CAPSULE | ORAL | 0 refills | Status: DC

## 2019-05-12 MED ORDER — TRAMADOL HCL 50 MG PO TABS: 50.0000 mg | ORAL_TABLET | Freq: Two times a day (BID) | ORAL | 0 refills | Status: DC | PRN

## 2019-05-12 MED ORDER — TIZANIDINE HCL 4 MG PO TABS: 4.0000 mg | ORAL_TABLET | Freq: Every day | ORAL | 0 refills | Status: DC | PRN

## 2019-05-12 MED ORDER — MELOXICAM 15 MG PO TABS: 15.0000 mg | ORAL_TABLET | Freq: Every day | ORAL | 1 refills | Status: DC

## 2019-05-12 MED ORDER — CLONAZEPAM 2 MG PO TABS: ORAL_TABLET | ORAL | 0 refills | Status: DC

## 2019-06-02 ENCOUNTER — Other Ambulatory Visit: Payer: Self-pay

## 2019-06-02 DIAGNOSIS — Z20822 Contact with and (suspected) exposure to covid-19: Secondary | ICD-10-CM

## 2019-06-03 LAB — NOVEL CORONAVIRUS, NAA: SARS-CoV-2, NAA: NOT DETECTED

## 2019-06-05 ENCOUNTER — Encounter: Payer: Self-pay | Admitting: Internal Medicine

## 2019-06-05 ENCOUNTER — Other Ambulatory Visit: Payer: Self-pay

## 2019-06-05 ENCOUNTER — Telehealth (INDEPENDENT_AMBULATORY_CARE_PROVIDER_SITE_OTHER): Payer: 59 | Admitting: Internal Medicine

## 2019-06-05 DIAGNOSIS — J22 Unspecified acute lower respiratory infection: Secondary | ICD-10-CM

## 2019-06-05 NOTE — Progress Notes (Signed)
Virtual Visit via Video Note  I connected with@ on 06/05/19 at 10:30 AM EDT by a video enabled telemedicine application and verified that I am speaking with the correct person using two identifiers. Location patient: home Location provider: home office Persons participating in the virtual visit: patient, provider  WIth national recommendations  regarding COVID 19 pandemic   video visit is advised over in office visit for this patient.  Patient aware  of the limitations of evaluation and management by telemedicine and  availability of in person appointments. and agreed to proceed.   HPI: Matthew [name] presents for video visit PCP NA He had the onset of body aches runny nose sore throat and eventually a mild cough on Thursday, October 15.  Symptoms progressed a bit had some fatigue but no fever and chills currently.  He got tested on October 19 for Covid which was negative.  Since that time he is improved still has some runny nose and a bit under the weather feels better today than yesterday asked for a note to go back to work.  He works at [employer] Monday through Friday.  Will need a note for supervisor.  His mom who he lives with also has a bad coughing illness she is also been tested for Covid.  He is unaware of the results but she was not contacted.   ROS: See pertinent positives and negatives per HPI. No current cp sob   Past Medical History:  Diagnosis Date  . Anxiety   . Depression   . Diabetes mellitus   . GERD (gastroesophageal reflux disease)   . H/O hiatal hernia    ?  Marland Kitchen Mental disorder   . OSA (obstructive sleep apnea)    Not wearing CPAP  . Parotid mass    rt  . Sleep apnea    just started on cpap 2 weeks  . Stones in the urinary tract     Past Surgical History:  Procedure Laterality Date  . HEMATOMA EVACUATION  07/04/2012   Procedure: EVACUATION HEMATOMA;  Surgeon: Melida Quitter, MD;  Location: [facility];  Service: ENT;  Laterality: Right;  . PAROTIDECTOMY  07/04/2012    Procedure: PAROTIDECTOMY;  Surgeon: Melida Quitter, MD;  [facility];  Service: ENT;  Laterality: Right;  right parotidectomy    Family History  Problem Relation Age of Onset  . Cancer Mother        breast  . Mental illness Mother   . Diabetes Father   . Stroke Brother     Social History   Tobacco Use  . Smoking status: Former Smoker    Packs/day: 0.25    Years: 5.00    Pack years: 1.25    Types: Cigarettes    Quit date: 05/28/2016    Years since quitting: 3.0  . Smokeless tobacco: Former Systems developer  . Tobacco comment: marijuana 06/26/12  occ social drinker  Substance Use Topics  . Alcohol use: Yes    Comment: monthly  . Drug use: Yes    Frequency: 1.0 times per week    Types: Marijuana    Comment: denies recent use      Current Outpatient Medications:  .  amitriptyline (ELAVIL) 100 MG tablet, TAKE 1 TABLET BY MOUTH EVERYDAY AT BEDTIME, Disp: 90 tablet, Rfl: 1 .  aspirin 81 MG tablet, Take 81 mg by mouth daily., Disp: , Rfl:  .  clonazePAM (KLONOPIN) 2 MG tablet, TAKE 1 TABLET BY MOUTH EVERY DAY FOR ANXIETY AS NEEDED, Disp:  30 tablet, Rfl: 0 .  meloxicam (MOBIC) 15 MG tablet, Take 1 tablet (15 mg total) by mouth daily., Disp: 90 tablet, Rfl: 1 .  metFORMIN (GLUCOPHAGE) 1000 MG tablet, TAKE 1 TABLET BY MOUTH TWICE A DAY WITH A MEAL, Disp: 180 tablet, Rfl: 2 .  omeprazole (PRILOSEC) 20 MG capsule, TAKE 1 CAPSULE BY MOUTH EVERY DAY, Disp: 90 capsule, Rfl: 0 .  rosuvastatin (CRESTOR) 10 MG tablet, Take 1 tablet (10 mg total) by mouth daily., Disp: 90 tablet, Rfl: 2 .  tiZANidine (ZANAFLEX) 4 MG tablet, Take 1 tablet (4 mg total) by mouth daily as needed for muscle spasms., Disp: 90 tablet, Rfl: 0 .  traMADol (ULTRAM) 50 MG tablet, Take 1 tablet (50 mg total) by mouth every 12 (twelve) hours as needed. Due for f/u in 05/2019, Disp: 60 tablet, Rfl: 0  EXAM: BP Readings from Last 3 Encounters:  10/07/18 122/80  09/17/18 120/78  09/11/18 118/80    VITALS per patient if  applicable:  GENERAL: alert, oriented, appears well and in no acute distress  HEENT: atraumatic, conjunttiva clear, no obvious abnormalities on inspection of external nose and ears  NECK: normal movements of the head and neck  LUNGS: on inspection no signs of respiratory distress, breathing rate appears normal, no obvious gross SOB, gasping or wheezing  CV: no obvious cyanosis  PSYCH/NEURO: pleasant and cooperative, no obvious depression or anxiety, speech and thought processing grossly intact   ASSESSMENT AND PLAN:  Discussed the following assessment and plan:    ICD-10-CM   1. 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   Expectant management and discussion of plan and treatment with opportunity to ask questions and all were answered. The patient agreed with the plan and demonstrated an understanding of the instructions.   Advised to call back or seek an in-person evaluation if worsening  or having  further concerns .   Shanon Ace, MD

## 2019-06-12 ENCOUNTER — Other Ambulatory Visit: Payer: Self-pay | Admitting: Family Medicine

## 2019-06-12 NOTE — Telephone Encounter (Signed)
Forwarding to PCP for approval  

## 2019-06-15 MED ORDER — TIZANIDINE HCL 4 MG PO TABS: 4.0000 mg | ORAL_TABLET | Freq: Every day | ORAL | 1 refills | Status: DC | PRN

## 2019-06-16 ENCOUNTER — Telehealth: Payer: Self-pay | Admitting: Family Medicine

## 2019-06-16 ENCOUNTER — Other Ambulatory Visit: Payer: Self-pay | Admitting: Family Medicine

## 2019-06-16 ENCOUNTER — Encounter: Payer: Self-pay | Admitting: Family Medicine

## 2019-06-16 DIAGNOSIS — E119 Type 2 diabetes mellitus without complications: Secondary | ICD-10-CM

## 2019-06-16 NOTE — Telephone Encounter (Signed)
Clonazepam last filled 05/12/2019 Tramadol last filled 05/12/2019 Tizanidine last filled 06/15/2019-NOT NEEDED Last OV 11/26/2018  Ok to fill?

## 2019-06-16 NOTE — Telephone Encounter (Signed)
RX REFILL clonazePAM (KLONOPIN) 2 MG tablet  traMADol (ULTRAM) 50 MG tablet  tiZANidine (ZANAFLEX) 4 MG tablet  PHARMACY [id number] [place], [place] - [address] AT [place] [phone] (Phone) [phone] (Fax)

## 2019-06-17 NOTE — Telephone Encounter (Signed)
Last f/u visit 11/26/2018. It seems like he is due for follow up (05/2019). Thanks, BJ

## 2019-06-25 ENCOUNTER — Encounter: Payer: Self-pay | Admitting: Family Medicine

## 2019-06-25 ENCOUNTER — Other Ambulatory Visit: Payer: Self-pay

## 2019-06-25 ENCOUNTER — Ambulatory Visit (INDEPENDENT_AMBULATORY_CARE_PROVIDER_SITE_OTHER): Payer: 59 | Admitting: Family Medicine

## 2019-06-25 VITALS — BP 126/84 | HR 96 | Temp 99.0°F | Resp 16 | Ht 71.0 in | Wt 273.0 lb

## 2019-06-25 DIAGNOSIS — M1990 Unspecified osteoarthritis, unspecified site: Secondary | ICD-10-CM

## 2019-06-25 DIAGNOSIS — R7401 Elevation of levels of liver transaminase levels: Secondary | ICD-10-CM

## 2019-06-25 DIAGNOSIS — Z79891 Long term (current) use of opiate analgesic: Secondary | ICD-10-CM

## 2019-06-25 DIAGNOSIS — E785 Hyperlipidemia, unspecified: Secondary | ICD-10-CM

## 2019-06-25 DIAGNOSIS — E119 Type 2 diabetes mellitus without complications: Secondary | ICD-10-CM | POA: Diagnosis not present

## 2019-06-25 DIAGNOSIS — Z23 Encounter for immunization: Secondary | ICD-10-CM | POA: Diagnosis not present

## 2019-06-25 DIAGNOSIS — G894 Chronic pain syndrome: Secondary | ICD-10-CM

## 2019-06-25 DIAGNOSIS — F419 Anxiety disorder, unspecified: Secondary | ICD-10-CM

## 2019-06-25 DIAGNOSIS — E1169 Type 2 diabetes mellitus with other specified complication: Secondary | ICD-10-CM

## 2019-06-25 LAB — COMPREHENSIVE METABOLIC PANEL
ALT: 39 U/L (ref 0–53)
AST: 24 U/L (ref 0–37)
Albumin: 4.6 g/dL (ref 3.5–5.2)
Alkaline Phosphatase: 69 U/L (ref 39–117)
BUN: 12 mg/dL (ref 6–23)
CO2: 27 mEq/L (ref 19–32)
Calcium: 9.6 mg/dL (ref 8.4–10.5)
Chloride: 104 mEq/L (ref 96–112)
Creatinine, Ser: 0.93 mg/dL (ref 0.40–1.50)
GFR: 84.32 mL/min (ref >60.00)
Glucose, Bld: 154 mg/dL — ABNORMAL HIGH (ref 70–99)
Potassium: 4.1 mEq/L (ref 3.5–5.1)
Sodium: 141 mEq/L (ref 135–145)
Total Bilirubin: 0.4 mg/dL (ref 0.2–1.2)
Total Protein: 7.3 g/dL (ref 6.0–8.3)

## 2019-06-25 LAB — LDL CHOLESTEROL, DIRECT: Direct LDL: 60 mg/dL

## 2019-06-25 LAB — HEMOGLOBIN A1C: Hgb A1c MFr Bld: 6.5 % (ref 4.6–6.5)

## 2019-06-25 LAB — LIPID PANEL
Cholesterol: 135 mg/dL (ref 0–200)
HDL: 47.5 mg/dL (ref >39.00)
NonHDL: 87.54
Total CHOL/HDL Ratio: 3
Triglycerides: 213 mg/dL — ABNORMAL HIGH (ref 0.0–149.0)
VLDL: 42.6 mg/dL — ABNORMAL HIGH (ref 0.0–40.0)

## 2019-06-25 MED ORDER — CLONAZEPAM 2 MG PO TABS: ORAL_TABLET | ORAL | 3 refills | Status: DC

## 2019-06-25 MED ORDER — TRAMADOL HCL 50 MG PO TABS: 50.0000 mg | ORAL_TABLET | Freq: Two times a day (BID) | ORAL | 3 refills | Status: DC | PRN

## 2019-06-25 NOTE — Assessment & Plan Note (Signed)
Medication contract signed today. Urine tox was ordered today. We discussed current guidelines for chronic opioid use for pain management. Follow-up in 3 to 4 months.

## 2019-06-25 NOTE — Assessment & Plan Note (Signed)
Further recommendation will be given according to lab results.

## 2019-06-25 NOTE — Assessment & Plan Note (Signed)
HgA1C has been at goal. No changes in current management, treatment will be adjusted if needed. Regular exercise and healthy diet with avoidance of added sugar food intake is an important part of treatment and recommended. Annual eye exam (planning on scheduling), periodic dental and foot care recommended. F/U in 5-6 months

## 2019-06-25 NOTE — Assessment & Plan Note (Signed)
No changes in current management, will follow labs done today and will give further recommendations accordingly.  

## 2019-06-25 NOTE — Progress Notes (Signed)
HPI:  Chief Complaint  Patient presents with  . Medication Refill  . pain management  . Anxiety    [name] is a 53 y.o. male, who is here today for chronic disease management. Last seen on 11/26/18. Chronic pain, knee and left foot mainly. Knee pain is intermittent, left ankle pain is constant.  Exacerbated by prolonged standing and walking. Alleviated by rest.  He is on Tramadol 50 mg bid prn.  Without Tramadol pain is 6-7/10, with medication 2-3/10.  He has tolerated medication well,denies side effects. Denies illicit drug use. Past Hx of marijuana use.   Lab Results  Component Value Date   CREATININE 0.98 02/01/2018   BUN 16 02/01/2018   NA 141 02/01/2018   K 4.4 02/01/2018   CL 103 02/01/2018   CO2 28 02/01/2018   HLD: He is on Crestor 10 mg daily. Tolerating medication well. Following a low fat diet.  Lab Results  Component Value Date   CHOL 180 02/01/2018   HDL 40.00 02/01/2018   LDLCALC 40 01/31/2017   LDLDIRECT 81.0 02/01/2018   TRIG 276.0 (H) 02/01/2018   CHOLHDL 5 02/01/2018   ALT have been elevated at 64. Denies high alcohol intake.  DM II: Dx'ed 10+ years ago. He is on Metformin 1000 mg bid. Tolerating medication well. He does not check BS's  Denies abdominal pain, nausea,vomiting, polydipsia,polyuria, or polyphagia.  Lab Results  Component Value Date   HGBA1C 6.0 (A) 06/24/2018    Review of Systems  Constitutional: Negative for activity change, appetite change, fatigue and fever.  HENT: Negative for mouth sores, nosebleeds and trouble swallowing.   Eyes: Negative for redness and visual disturbance.  Respiratory: Negative for cough, shortness of breath and wheezing.   Cardiovascular: Negative for chest pain, palpitations and leg swelling.  Gastrointestinal: Negative for changes in bowel habits.  Genitourinary: Negative for decreased urine volume, difficulty urinating, dysuria and hematuria.  Musculoskeletal:  Negative for gait problem and myalgias.  Skin: Negative for color change and rash.  Neurological: Negative for syncope, weakness and headaches.  Psychiatric/Behavioral: Negative for confusion or depression, + anxious. Rest of ROS see pertinent positives and negatives in HPI.  Current Outpatient Medications on File Prior to Visit  Medication Sig Dispense Refill  . amitriptyline (ELAVIL) 100 MG tablet TAKE 1 TABLET BY MOUTH EVERYDAY AT BEDTIME 90 tablet 1  . aspirin 81 MG tablet Take 81 mg by mouth daily.    . meloxicam (MOBIC) 15 MG tablet Take 1 tablet (15 mg total) by mouth daily. 90 tablet 1  . metFORMIN (GLUCOPHAGE) 1000 MG tablet TAKE 1 TABLET BY MOUTH TWICE A DAY WITH A MEAL 180 tablet 0  . omeprazole (PRILOSEC) 20 MG capsule TAKE 1 CAPSULE BY MOUTH EVERY DAY 90 capsule 0  . rosuvastatin (CRESTOR) 10 MG tablet Take 1 tablet (10 mg total) by mouth daily. 90 tablet 2  . tiZANidine (ZANAFLEX) 4 MG tablet Take 1 tablet (4 mg total) by mouth daily as needed for muscle spasms. 90 tablet 1   No current facility-administered medications on file prior to visit.     Past Medical History:  Diagnosis Date  . Anxiety   . Depression   . Diabetes mellitus   . GERD (gastroesophageal reflux disease)   . H/O hiatal hernia    ?  Marland Kitchen Mental disorder   . OSA (obstructive sleep apnea)    Not wearing CPAP  . Parotid mass    rt  . Sleep  apnea    just started on cpap 2 weeks  . Stones in the urinary tract     No Known Allergies  Social History   Socioeconomic History  . Marital status: Single    Spouse name: Not on file  . Number of children: Not on file  . Years of education: Not on file  . Highest education level: Not on file  Occupational History  . Not on file  Social Needs  . Financial resource strain: Not on file  . Food insecurity    Worry: Not on file    Inability: Not on file  . Transportation needs    Medical: Not on file    Non-medical: Not on file  Tobacco Use  .  Smoking status: Former Smoker    Packs/day: 0.25    Years: 5.00    Pack years: 1.25    Types: Cigarettes    Quit date: 05/28/2016    Years since quitting: 3.0  . Smokeless tobacco: Former Systems developer  . Tobacco comment: marijuana 06/26/12  occ social drinker  Substance and Sexual Activity  . Alcohol use: Yes    Comment: monthly  . Drug use: Yes    Frequency: 1.0 times per week    Types: Marijuana    Comment: denies recent use  . Sexual activity: Not on file  Lifestyle  . Physical activity    Days per week: Not on file    Minutes per session: Not on file  . Stress: Not on file  Relationships  . Social Herbalist on phone: Not on file    Gets together: Not on file    Attends religious service: Not on file    Active member of club or organization: Not on file    Attends meetings of clubs or organizations: Not on file    Relationship status: Not on file  Other Topics Concern  . Not on file  Social History Narrative  . Not on file    Today's Vitals   06/25/19 0908  BP: 126/84  Pulse: 96  Resp: 16  Temp: 99 F (37.2 C)  TempSrc: Oral  Weight: 273 lb (123.8 kg)  Height: 5\' 11"  (1.803 m)  PainSc: 6   PainLoc: Ankle    Body mass index is 38.08 kg/m.   Physical Exam  Nursing note and vitals reviewed. Constitutional: She is oriented to person, place, and time. She appears well-developed. No distress. +Diaphoretic. HENT:  Head: Normocephalic and atraumatic.  Mouth/Throat: Oropharynx is clear and moist and mucous membranes are normal.  Eyes: Pupils are equal, round, and reactive to light. Conjunctivae are normal.  Cardiovascular: Normal rate and regular rhythm.  No murmur heard. Pulses:      Dorsalis pedis pulses are 2+ on the right side, and 2+ on the left side.  Respiratory: Effort normal and breath sounds normal. No respiratory distress.  GI: Soft. She exhibits no mass. There is no hepatomegaly. There is no tenderness.  Musculoskeletal: She exhibits no  edema.  Lymphadenopathy:    She has no cervical adenopathy.  Neurological: She is alert and oriented to person, place, and time. She has normal strength. No cranial nerve deficit. Gait normal.  Skin: Skin is warm. No rash noted. No erythema.  Psychiatric: She has a normal mood and affect.  Well groomed, good eye contact.   Diabetic Foot Exam - Simple   Simple Foot Form Diabetic Foot exam was performed with the following findings: Yes 06/25/2019  9:25  AM  Visual Inspection See comments: Yes Sensation Testing Intact to touch and monofilament testing bilaterally: Yes Pulse Check Posterior Tibialis and Dorsalis pulse intact bilaterally: Yes Comments Hypertrophic and long toe nails. Bony prominence, right post ankle with callus.      ASSESSMENT AND PLAN:  Mr. Hulon was seen today for medication refill, pain management and anxiety.  Diagnoses and all orders for this visit: Orders Placed This Encounter  Procedures  . Flu Vaccine QUAD 6+ mos PF IM (Fluarix Quad PF)  . Pain Mgmt, Profile 4 w/Conf, U  . Hemoglobin A1c  . Lipid panel  . LDL cholesterol, direct    Osteoarthritis, unspecified osteoarthritis type, unspecified site Pain adequately controlled. No changes in Tramadol or Meloxicam. Side effects of chronic NSAID discussed. Wt loss will also help.  -     traMADol (ULTRAM) 50 MG tablet; Take 1 tablet (50 mg total) by mouth every 12 (twelve) hours as needed. Due for f/u in 05/2019  Chronically on opiate therapy -     Pain Mgmt, Profile 4 w/Conf, U  Need for prophylactic vaccination and inoculation against influenza -     Flu Vaccine QUAD 6+ mos PF IM (Fluarix Quad PF)  Hyperlipidemia associated with type 2 diabetes mellitus ([place]) No changes in current management, will follow labs done today and will give further recommendations accordingly.   Elevated ALT measurement Further recommendation will be given according to lab results.  Type 2 diabetes mellitus ([place])  HgA1C has been at goal. No changes in current management, treatment will be adjusted if needed. Regular exercise and healthy diet with avoidance of added sugar food intake is an important part of treatment and recommended. Annual eye exam (planning on scheduling), periodic dental and foot care recommended. F/U in 5-6 months   Anxiety disorder Problem is stable. Continue amitriptyline 100 mg daily and clonazepam milligrams daily. We discussed some side effects of medications. Medication contract signed today for clonazepam.  Chronic pain disorder Medication contract signed today. Urine tox was ordered today. We discussed current guidelines for chronic opioid use for pain management. Follow-up in 3 to 4 months.   Return in about 5 months (around 11/23/2019).   Mette Southgate [place], MD [facility]. [place] office.

## 2019-06-25 NOTE — Assessment & Plan Note (Signed)
Problem is stable. Continue amitriptyline 100 mg daily and clonazepam milligrams daily. We discussed some side effects of medications. Medication contract signed today for clonazepam.

## 2019-06-25 NOTE — Patient Instructions (Signed)
A few things to remember from today's visit:   Elevated ALT measurement - Plan: Comprehensive metabolic panel  Type 2 diabetes mellitus without complication, without long-term current use of insulin (H[facility]) - Plan: Microalbumin / creatinine urine ratio, Hemoglobin A1c  Osteoarthritis, unspecified osteoarthritis type, unspecified site  Hyperlipidemia associated with type 2 diabetes mellitus ([place]) - Plan: Lipid panel  Chronically on opiate therapy - Plan: Pain Mgmt, Profile 4 w/Conf, U   Please be sure medication list is accurate. If a new problem present, please set up appointment sooner than planned today.

## 2019-06-28 ENCOUNTER — Encounter: Payer: Self-pay | Admitting: Family Medicine

## 2019-06-28 MED ORDER — ROSUVASTATIN CALCIUM 20 MG PO TABS: 20.0000 mg | ORAL_TABLET | Freq: Every day | ORAL | 2 refills | Status: DC

## 2019-07-18 ENCOUNTER — Other Ambulatory Visit: Payer: Self-pay | Admitting: Family Medicine

## 2019-07-18 DIAGNOSIS — K219 Gastro-esophageal reflux disease without esophagitis: Secondary | ICD-10-CM

## 2019-07-31 ENCOUNTER — Other Ambulatory Visit: Payer: Self-pay | Admitting: Family Medicine

## 2019-07-31 DIAGNOSIS — E7800 Pure hypercholesterolemia, unspecified: Secondary | ICD-10-CM

## 2019-08-06 NOTE — Progress Notes (Signed)
Patient is scheduled for 11/24/2019 at 8:30

## 2019-09-10 ENCOUNTER — Other Ambulatory Visit: Payer: Self-pay | Admitting: Family Medicine

## 2019-09-10 DIAGNOSIS — M1990 Unspecified osteoarthritis, unspecified site: Secondary | ICD-10-CM

## 2019-09-10 DIAGNOSIS — E7800 Pure hypercholesterolemia, unspecified: Secondary | ICD-10-CM

## 2019-09-10 DIAGNOSIS — G894 Chronic pain syndrome: Secondary | ICD-10-CM

## 2019-09-10 DIAGNOSIS — G4700 Insomnia, unspecified: Secondary | ICD-10-CM

## 2019-09-17 ENCOUNTER — Other Ambulatory Visit: Payer: Self-pay | Admitting: Family Medicine

## 2019-09-17 DIAGNOSIS — E1169 Type 2 diabetes mellitus with other specified complication: Secondary | ICD-10-CM

## 2019-09-17 DIAGNOSIS — E7800 Pure hypercholesterolemia, unspecified: Secondary | ICD-10-CM

## 2019-09-17 MED ORDER — ROSUVASTATIN CALCIUM 20 MG PO TABS: 20.0000 mg | ORAL_TABLET | Freq: Every day | ORAL | 2 refills | Status: DC

## 2019-11-21 ENCOUNTER — Other Ambulatory Visit: Payer: Self-pay

## 2019-11-24 ENCOUNTER — Ambulatory Visit (INDEPENDENT_AMBULATORY_CARE_PROVIDER_SITE_OTHER): Payer: 59 | Admitting: Family Medicine

## 2019-11-24 ENCOUNTER — Other Ambulatory Visit: Payer: Self-pay

## 2019-11-24 ENCOUNTER — Encounter: Payer: Self-pay | Admitting: Family Medicine

## 2019-11-24 VITALS — BP 130/84 | HR 101 | Temp 97.7°F | Resp 12 | Ht 71.0 in | Wt 276.5 lb

## 2019-11-24 DIAGNOSIS — G4700 Insomnia, unspecified: Secondary | ICD-10-CM

## 2019-11-24 DIAGNOSIS — E1169 Type 2 diabetes mellitus with other specified complication: Secondary | ICD-10-CM | POA: Diagnosis not present

## 2019-11-24 DIAGNOSIS — R079 Chest pain, unspecified: Secondary | ICD-10-CM

## 2019-11-24 DIAGNOSIS — G894 Chronic pain syndrome: Secondary | ICD-10-CM | POA: Diagnosis not present

## 2019-11-24 DIAGNOSIS — E119 Type 2 diabetes mellitus without complications: Secondary | ICD-10-CM

## 2019-11-24 DIAGNOSIS — F419 Anxiety disorder, unspecified: Secondary | ICD-10-CM | POA: Diagnosis not present

## 2019-11-24 DIAGNOSIS — M7661 Achilles tendinitis, right leg: Secondary | ICD-10-CM

## 2019-11-24 DIAGNOSIS — Z79899 Other long term (current) drug therapy: Secondary | ICD-10-CM

## 2019-11-24 DIAGNOSIS — E785 Hyperlipidemia, unspecified: Secondary | ICD-10-CM | POA: Diagnosis not present

## 2019-11-24 LAB — LIPID PANEL
Cholesterol: 98 mg/dL (ref 0–200)
HDL: 45.9 mg/dL (ref >39.00)
LDL Cholesterol: 41 mg/dL (ref 0–99)
NonHDL: 51.77
Total CHOL/HDL Ratio: 2
Triglycerides: 54 mg/dL (ref 0.0–149.0)
VLDL: 10.8 mg/dL (ref 0.0–40.0)

## 2019-11-24 LAB — BASIC METABOLIC PANEL
BUN: 15 mg/dL (ref 6–23)
CO2: 27 mEq/L (ref 19–32)
Calcium: 9.1 mg/dL (ref 8.4–10.5)
Chloride: 105 mEq/L (ref 96–112)
Creatinine, Ser: 0.78 mg/dL (ref 0.40–1.50)
GFR: 103.14 mL/min (ref >60.00)
Glucose, Bld: 124 mg/dL — ABNORMAL HIGH (ref 70–99)
Potassium: 4.7 mEq/L (ref 3.5–5.1)
Sodium: 138 mEq/L (ref 135–145)

## 2019-11-24 LAB — HEMOGLOBIN A1C: Hgb A1c MFr Bld: 6.9 % — ABNORMAL HIGH (ref 4.6–6.5)

## 2019-11-24 LAB — MICROALBUMIN / CREATININE URINE RATIO
Creatinine,U: 64.2 mg/dL
Microalb Creat Ratio: 1.1 mg/g (ref 0.0–30.0)
Microalb, Ur: 0.7 mg/dL (ref 0.0–1.9)

## 2019-11-24 NOTE — Assessment & Plan Note (Signed)
No changes in current management, will follow labs done today and will give further recommendations accordingly.  

## 2019-11-24 NOTE — Assessment & Plan Note (Addendum)
Urine tox ordered today. Med contract 06/2019. PDMP reviewed.

## 2019-11-24 NOTE — Assessment & Plan Note (Signed)
Stable. Continue Clonazepam 2 mg daily prn.

## 2019-11-24 NOTE — Assessment & Plan Note (Signed)
HgA1C has been at goal. No changes in current management, meds will be adjusted according to A1C. Regular exercise and healthy diet with avoidance of added sugar food intake is an important part of treatment and recommended. Annual eye exam, periodic dental and foot care recommended. F/U in 5-6 months

## 2019-11-24 NOTE — Patient Instructions (Signed)
A few things to remember from today's visit:   Type 2 diabetes mellitus without complication, without long-term current use of insulin ([place]) - Plan: Basic metabolic panel, Hemoglobin A1c, Microalbumin / creatinine urine ratio  Hyperlipidemia associated with type 2 diabetes mellitus ([place]) - Plan: Basic metabolic panel, Lipid panel  Chronic pain disorder - Plan: Pain Mgmt, Profile 8 w/Conf, U  Anxiety disorder, unspecified type  High risk medication use - Plan: Pain Mgmt, Profile 8 w/Conf, U  No changes today. Please arrange appointment with podiatrist for your heel pain.  Rosen's Emergency Medicine: Concepts and Clinical Practice (9th ed., pp. [id number]). [place], [place]: [place]. Retrieved from [email or web address].[email or web address].[email or web address]  Achilles Tendinitis  Achilles tendinitis is inflammation of the tough, cord-like band that attaches the lower leg muscles to the heel bone (Achilles tendon). This is usually caused by overusing the tendon and the ankle joint. Achilles tendinitis usually gets better over time with treatment and caring for yourself at home. It can take weeks or months to heal completely. What are the causes? This condition may be caused by:  A sudden increase in exercise or activity, such as running.  Doing the same exercises or activities, such as jumping, over and over.  Not warming up calf muscles before exercising.  Exercising in shoes that are worn out or not made for exercise.  Having arthritis or a bone growth (spur) on the back of the heel bone. This can rub against the tendon and hurt it.  Age-related wear and tear. Tendons become less flexible with age and are more likely to be injured. What are the signs or symptoms? Common symptoms of this condition include:  Pain in the Achilles tendon or in the back of the leg, just above the heel. The pain usually gets worse with exercise.  Stiffness or  soreness in the back of the leg, especially in the morning.  Swelling of the skin over the Achilles tendon.  Thickening of the tendon.  Trouble standing on tiptoe. How is this diagnosed? This condition is diagnosed based on your symptoms and a physical exam. You may have tests, including:  X-rays.  MRI. How is this treated? The goal of treatment is to relieve symptoms and help your injury heal. Treatment may include:  Decreasing or stopping activities that caused the tendinitis. This may mean switching to low-impact exercises like biking or swimming.  Icing the injured area.  Doing physical therapy, including strengthening and stretching exercises.  Taking NSAIDs, such as ibuprofen, to help relieve pain and swelling.  Using supportive shoes, wraps, heel lifts, or a walking boot (air cast).  Having surgery. This may be done if your symptoms do not improve after other treatments.  Using high-energy shock wave impulses to stimulate the healing process (extracorporeal shock wave therapy). This is rare.  Having an injection of medicines that help relieve inflammation (corticosteroids). This is rare. Follow these instructions at home: If you have an air cast:  Wear the air cast as told by your health care provider. Remove it only as told by your health care provider.  Loosen it if your toes tingle, become numb, or turn cold and blue.  Keep it clean.  If the air cast is not waterproof: ? Do not let it get wet. ? Cover it with a watertight covering when you take a bath or shower. Managing pain, stiffness, and swelling   If directed, put ice on the injured area. To do this: ? If you have a removable air cast, remove  it as told by your health care provider. ? Put ice in a plastic bag. ? Place a towel between your skin and the bag. ? Leave the ice on for 20 minutes, 2-3 times a day.  Move your toes often to reduce stiffness and swelling.  Raise (elevate) your foot above the  level of your heart while you are sitting or lying down. Activity  Gradually return to your normal activities as told by your health care provider. Ask your health care provider what activities are safe for you.  Do not do activities that cause pain.  Consider doing low-impact exercises, like cycling or swimming.  Ask your health care provider when it is safe to drive if you have an air cast on your foot.  If physical therapy was prescribed, do exercises as told by your health care provider or physical therapist. General instructions  If directed, wrap your foot with an elastic bandage or other wrap. This can help to keep your tendon from moving too much while it heals. Your health care provider will show you how to wrap your foot correctly.  Wear supportive shoes or heel lifts only as told by your health care provider.  Take over-the-counter and prescription medicines only as told by your health care provider.  Keep all follow-up visits as told by your health care provider. This is important. Contact a health care provider if you:  Have symptoms that get worse.  Have pain that does not get better with medicine.  Develop new, unexplained symptoms.  Develop warmth and swelling in your foot.  Have a fever. Get help right away if you:  Have a sudden popping sound or sensation in your Achilles tendon followed by severe pain.  Cannot move your toes or foot.  Cannot put any weight on your foot.  Your foot or toes become numb and look white or blue even after loosening your bandage or air cast. Summary  Achilles tendinitis is inflammation of the tough, cord-like band that attaches the lower leg muscles to the heel bone (Achilles tendon).  This condition is usually caused by overusing the tendon and the ankle joint. It can also be caused by arthritis or normal aging.  The most common symptoms of this condition include pain, swelling, or stiffness in the Achilles tendon or in  the back of the leg.  This condition is usually treated by decreasing or stopping activities that caused the tendinitis, icing the injured area, taking NSAIDs, and doing physical therapy. This information is not intended to replace advice given to you by your health care provider. Make sure you discuss any questions you have with your health care provider. Document Revised: 12/16/2018 Document Reviewed: 12/16/2018 Elsevier Patient Education  Matthew [name].   Please be sure medication list is accurate. If a new problem present, please set up appointment sooner than planned today.

## 2019-11-24 NOTE — Progress Notes (Signed)
HPI:   Matthew [name] is a 54 y.o. male, who is here today for chronic disease management.  He was last seen on 06/25/19.  No new problems sine his last visit.  DM II: Dx'ed 7+ years ago. He is on Metformin 1000 mg bid. BS's: Not checking. He has not noted hypoglycemic like symptoms.  Lab Results  Component Value Date   HGBA1C 6.5 06/25/2019   Denies abdominal pain, nausea,vomiting, polydipsia,polyuria, or polyphagia.  Lab Results  Component Value Date   MICROALBUR <0.7 07/31/2017   MICROALBUR 0.6 05/25/2014    HLD: Crestor dose was increased last visit. Tolerating well.  Lab Results  Component Value Date   CHOL 135 06/25/2019   HDL 47.50 06/25/2019   LDLCALC 40 01/31/2017   LDLDIRECT 60.0 06/25/2019   TRIG 213.0 (H) 06/25/2019   CHOLHDL 3 06/25/2019   He has not been consistent with following low fat diet. Eating ice cream more frequent for the past few weeks. He has gained some wt. No exertional CP but "sometimes" left-sided sharp,sudden pain that last a few minutes and usually happens with certain activities. No associated palpitations,SOB,dyspnea,or dizziness.   Lab Results  Component Value Date   CREATININE 0.93 06/25/2019   BUN 12 06/25/2019   NA 141 06/25/2019   K 4.1 06/25/2019   CL 104 06/25/2019   CO2 27 06/25/2019   Anxiety and insomnia: He is on Clonazepam 2 mg daily and Amitriptyline 100 mg daily. He has been on these medications for years. Still tolerating well. Sleeping about 6 hours. He works 3rd shift.  Chronic pain: He is on Tramadol 50 mg bid and Mobic 15 mg daily as needed. For lower back pain he is also taking Zanafelx. He is tolerating medications well. Pain is worse in left foot, constant ankle pain, 7-8/10. He has a very physical job, long standing/walking aggravate problem.  Today he is c/o right ankle pain,posterior aspect. This has been going on for a while. He has seen podiatrist. Treated with  inserts. Negative for numbness,tingling,edema,or erythema. Pain is exacerbated by shoe wear. Getting worse. No hx of trauma.  Review of Systems  Constitutional: Positive for fatigue. Negative for activity change, appetite change and fever.  HENT: Negative for mouth sores, nosebleeds and sore throat.   Eyes: Negative for redness and visual disturbance.  Respiratory: Negative for cough and wheezing.   Cardiovascular: Negative for leg swelling.  Gastrointestinal: Negative for vomiting.       No changes in bowel habits.  Genitourinary: Negative for decreased urine volume, dysuria and hematuria.  Musculoskeletal: Positive for arthralgias and back pain. Negative for gait problem and myalgias.  Skin: Negative for rash and wound.  Neurological: Negative for seizures, weakness and headaches.  Rest of ROS, see pertinent positives sand negatives in HPI  Current Outpatient Medications on File Prior to Visit  Medication Sig Dispense Refill  . amitriptyline (ELAVIL) 100 MG tablet TAKE 1 TABLET BY MOUTH EVERYDAY AT BEDTIME 90 tablet 1  . aspirin 81 MG tablet Take 81 mg by mouth daily.    . clonazePAM (KLONOPIN) 2 MG tablet TAKE 1 TABLET BY MOUTH EVERY DAY FOR ANXIETY AS NEEDED 30 tablet 3  . meloxicam (MOBIC) 15 MG tablet TAKE 1 TABLET EVERY DAY 90 tablet 1  . metFORMIN (GLUCOPHAGE) 1000 MG tablet TAKE 1 TABLET BY MOUTH TWICE A DAY WITH A MEAL 180 tablet 0  . omeprazole (PRILOSEC) 20 MG capsule TAKE 1 CAPSULE BY MOUTH EVERY DAY 90  capsule 1  . rosuvastatin (CRESTOR) 20 MG tablet Take 1 tablet (20 mg total) by mouth daily. 90 tablet 2  . traMADol (ULTRAM) 50 MG tablet Take 1 tablet (50 mg total) by mouth every 12 (twelve) hours as needed. Due for f/u in 05/2019 60 tablet 3   No current facility-administered medications on file prior to visit.   Past Medical History:  Diagnosis Date  . Anxiety   . Depression   . Diabetes mellitus   . GERD (gastroesophageal reflux disease)   . H/O hiatal hernia     ?  Marland Kitchen Mental disorder   . OSA (obstructive sleep apnea)    Not wearing CPAP  . Parotid mass    rt  . Sleep apnea    just started on cpap 2 weeks  . Stones in the urinary tract    No Known Allergies  Social History   Socioeconomic History  . Marital status: Single    Spouse name: Not on file  . Number of children: Not on file  . Years of education: Not on file  . Highest education level: Not on file  Occupational History  . Not on file  Tobacco Use  . Smoking status: Former Smoker    Packs/day: 0.25    Years: 5.00    Pack years: 1.25    Types: Cigarettes    Quit date: 05/28/2016    Years since quitting: 3.5  . Smokeless tobacco: Former Systems developer  . Tobacco comment: marijuana 06/26/12  occ social drinker  Substance and Sexual Activity  . Alcohol use: Yes    Comment: monthly  . Drug use: Yes    Frequency: 1.0 times per week    Types: Marijuana    Comment: denies recent use  . Sexual activity: Not on file  Other Topics Concern  . Not on file  Social History Narrative  . Not on file   Social Determinants of Health   Financial Resource Strain:   . Difficulty of Paying Living Expenses:   Food Insecurity:   . Worried About Charity fundraiser in the Last Year:   . Arboriculturist in the Last Year:   Transportation Needs:   . Film/video editor (Medical):   Marland Kitchen Lack of Transportation (Non-Medical):   Physical Activity:   . Days of Exercise per Week:   . Minutes of Exercise per Session:   Stress:   . Feeling of Stress :   Social Connections:   . Frequency of Communication with Friends and Family:   . Frequency of Social Gatherings with Friends and Family:   . Attends Religious Services:   . Active Member of Clubs or Organizations:   . Attends Archivist Meetings:   Marland Kitchen Marital Status:     Vitals:   11/24/19 0825  BP: 130/84  Pulse: (!) 101  Resp: 12  Temp: 97.7 F (36.5 C)  SpO2: 96%   Body mass index is 38.56 kg/m.   Physical Exam   Nursing note and vitals reviewed. Constitutional: He is oriented to person, place, and time. He appears well-developed. No distress.  HENT:  Head: Normocephalic and atraumatic.  Mouth/Throat: Oropharynx is clear and moist and mucous membranes are normal.  Eyes: Pupils are equal, round, and reactive to light. Conjunctivae are normal.  Cardiovascular: Normal rate and regular rhythm.  No murmur heard. Pulses:      Dorsalis pedis pulses are 2+ on the right side and 2+ on the left side.  HR by my count 96/min.  Respiratory: Effort normal and breath sounds normal. No respiratory distress. He exhibits no tenderness.  GI: Soft. He exhibits no mass. There is no hepatomegaly. There is no abdominal tenderness.  Musculoskeletal:        General: No edema.     Right ankle: Tenderness present.     Right Achilles Tendon: Thompson's test negative.       Feet:     Comments: Tenderness upon palpation of posterior aspect of right ankle, no edema or deformity.  Lymphadenopathy:    He has no cervical adenopathy.  Neurological: He is alert and oriented to person, place, and time. He has normal strength. No cranial nerve deficit. Gait normal.  Skin: Skin is warm. No rash noted. No erythema.  Psychiatric: He has a normal mood and affect.  Well groomed, good eye contact.   Diabetic Foot Exam - Simple   Simple Foot Form Diabetic Foot exam was performed with the following findings: Yes 11/24/2019  2:59 PM  Visual Inspection No deformities, no ulcerations, no other skin breakdown bilaterally: Yes Sensation Testing Intact to touch and monofilament testing bilaterally: Yes Pulse Check Posterior Tibialis and Dorsalis pulse intact bilaterally: Yes Comments    ASSESSMENT AND PLAN:  Matthew [name] was seen today for chronic disease management.  Orders Placed This Encounter  Procedures  . Basic metabolic panel  . Hemoglobin A1c  . Lipid panel  . Microalbumin / creatinine urine ratio  . Pain Mgmt,  Profile 8 w/Conf, U   Lab Results  Component Value Date   CHOL 98 11/24/2019   HDL 45.90 11/24/2019   LDLCALC 41 11/24/2019   LDLDIRECT 60.0 06/25/2019   TRIG 54.0 11/24/2019   CHOLHDL 2 11/24/2019   Lab Results  Component Value Date   HGBA1C 6.9 (H) 11/24/2019   Lab Results  Component Value Date   CREATININE 0.78 11/24/2019   BUN 15 11/24/2019   NA 138 11/24/2019   K 4.7 11/24/2019   CL 105 11/24/2019   CO2 27 11/24/2019   Lab Results  Component Value Date   MICROALBUR 0.7 11/24/2019   MICROALBUR <0.7 07/31/2017    Type 2 diabetes mellitus ([place]) HgA1C has been at goal. No changes in current management, meds will be adjusted according to A1C. Regular exercise and healthy diet with avoidance of added sugar food intake is an important part of treatment and recommended. Annual eye exam, periodic dental and foot care recommended. F/U in 5-6 months   Anxiety disorder Stable. Continue Clonazepam 2 mg daily prn.  Chronic pain disorder Urine tox ordered today. Med contract 06/2019. PDMP reviewed.  Hyperlipidemia associated with type 2 diabetes mellitus ([facility]) No changes in current management, will follow labs done today and will give further recommendations accordingly.   High risk medication use - Pain Mgmt, Profile 8 w/Conf, U  Morbid obesity due to excess calories ([facility]) We discussed benefits of wt loss as well as adverse effects of obesity. Consistency with healthy diet and physical activity recommended.  Insomnia, unspecified type Problem stable and adequately controlled. No changes in current management. Good sleep hygiene also recommended.  Chest pain, unspecified type Possible etiologies discussed. Left before CXR and EKG were done,will arrange nurse visit.  Achilles tendinitis of right lower extremity Seems to be a chronic problem. Recommend following with podiatrist.   Return in about 5 months (around 04/25/2020).   Matthew Burruss G. [place],  MD  [facility]. [place] office.  A few things to  remember from today's visit:   Type 2 diabetes mellitus without complication, without long-term current use of insulin ([place]) - Plan: Basic metabolic panel, Hemoglobin A1c, Microalbumin / creatinine urine ratio  Hyperlipidemia associated with type 2 diabetes mellitus ([place]) - Plan: Basic metabolic panel, Lipid panel  Chronic pain disorder - Plan: Pain Mgmt, Profile 8 w/Conf, U  Anxiety disorder, unspecified type  High risk medication use - Plan: Pain Mgmt, Profile 8 w/Conf, U  No changes today. Please arrange appointment with podiatrist for your heel pain.  Rosen's Emergency Medicine: Concepts and Clinical Practice (9th ed., pp. [id number]). [place], [place]: [place]. Retrieved from [email or web address].clinicalkey.[email or web address]  Achilles Tendinitis  Achilles tendinitis is inflammation of the tough, cord-like band that attaches the lower leg muscles to the heel bone (Achilles tendon). This is usually caused by overusing the tendon and the ankle joint. Achilles tendinitis usually gets better over time with treatment and caring for yourself at home. It can take weeks or months to heal completely. What are the causes? This condition may be caused by:  A sudden increase in exercise or activity, such as running.  Doing the same exercises or activities, such as jumping, over and over.  Not warming up calf muscles before exercising.  Exercising in shoes that are worn out or not made for exercise.  Having arthritis or a bone growth (spur) on the back of the heel bone. This can rub against the tendon and hurt it.  Age-related wear and tear. Tendons become less flexible with age and are more likely to be injured. What are the signs or symptoms? Common symptoms of this condition include:  Pain in the Achilles tendon or in the back of the leg, just above the heel. The pain  usually gets worse with exercise.  Stiffness or soreness in the back of the leg, especially in the morning.  Swelling of the skin over the Achilles tendon.  Thickening of the tendon.  Trouble standing on tiptoe. How is this diagnosed? This condition is diagnosed based on your symptoms and a physical exam. You may have tests, including:  X-rays.  MRI. How is this treated? The goal of treatment is to relieve symptoms and help your injury heal. Treatment may include:  Decreasing or stopping activities that caused the tendinitis. This may mean switching to low-impact exercises like biking or swimming.  Icing the injured area.  Doing physical therapy, including strengthening and stretching exercises.  Taking NSAIDs, such as ibuprofen, to help relieve pain and swelling.  Using supportive shoes, wraps, heel lifts, or a walking boot (air cast).  Having surgery. This may be done if your symptoms do not improve after other treatments.  Using high-energy shock wave impulses to stimulate the healing process (extracorporeal shock wave therapy). This is rare.  Having an injection of medicines that help relieve inflammation (corticosteroids). This is rare. Follow these instructions at home: If you have an air cast:  Wear the air cast as told by your health care provider. Remove it only as told by your health care provider.  Loosen it if your toes tingle, become numb, or turn cold and blue.  Keep it clean.  If the air cast is not waterproof: ? Do not let it get wet. ? Cover it with a watertight covering when you take a bath or shower. Managing pain, stiffness, and swelling   If directed, put ice on the injured area. To do this: ? If you have a removable air cast, remove it as told by  your health care provider. ? Put ice in a plastic bag. ? Place a towel between your skin and the bag. ? Leave the ice on for 20 minutes, 2-3 times a day.  Move your toes often to reduce stiffness  and swelling.  Raise (elevate) your foot above the level of your heart while you are sitting or lying down. Activity  Gradually return to your normal activities as told by your health care provider. Ask your health care provider what activities are safe for you.  Do not do activities that cause pain.  Consider doing low-impact exercises, like cycling or swimming.  Ask your health care provider when it is safe to drive if you have an air cast on your foot.  If physical therapy was prescribed, do exercises as told by your health care provider or physical therapist. General instructions  If directed, wrap your foot with an elastic bandage or other wrap. This can help to keep your tendon from moving too much while it heals. Your health care provider will show you how to wrap your foot correctly.  Wear supportive shoes or heel lifts only as told by your health care provider.  Take over-the-counter and prescription medicines only as told by your health care provider.  Keep all follow-up visits as told by your health care provider. This is important. Contact a health care provider if you:  Have symptoms that get worse.  Have pain that does not get better with medicine.  Develop new, unexplained symptoms.  Develop warmth and swelling in your foot.  Have a fever. Get help right away if you:  Have a sudden popping sound or sensation in your Achilles tendon followed by severe pain.  Cannot move your toes or foot.  Cannot put any weight on your foot.  Your foot or toes become numb and look white or blue even after loosening your bandage or air cast. Summary  Achilles tendinitis is inflammation of the tough, cord-like band that attaches the lower leg muscles to the heel bone (Achilles tendon).  This condition is usually caused by overusing the tendon and the ankle joint. It can also be caused by arthritis or normal aging.  The most common symptoms of this condition include pain,  swelling, or stiffness in the Achilles tendon or in the back of the leg.  This condition is usually treated by decreasing or stopping activities that caused the tendinitis, icing the injured area, taking NSAIDs, and doing physical therapy. This information is not intended to replace advice given to you by your health care provider. Make sure you discuss any questions you have with your health care provider. Document Revised: 12/16/2018 Document Reviewed: 12/16/2018 Elsevier Patient Education  [place].   Please be sure medication list is accurate. If a new problem present, please set up appointment sooner than planned today.

## 2019-11-26 LAB — PAIN MGMT, PROFILE 8 W/CONF, U
6 Acetylmorphine: NEGATIVE ng/mL
Alcohol Metabolites: NEGATIVE ng/mL (ref <500)
Amphetamines: NEGATIVE ng/mL
Benzodiazepines: NEGATIVE ng/mL
Buprenorphine, Urine: NEGATIVE ng/mL
Cocaine Metabolite: NEGATIVE ng/mL
Creatinine: 63 mg/dL
MDMA: NEGATIVE ng/mL
Marijuana Metabolite: 836 ng/mL
Marijuana Metabolite: POSITIVE ng/mL
Opiates: NEGATIVE ng/mL
Oxidant: NEGATIVE ug/mL
Oxycodone: NEGATIVE ng/mL
pH: 5.4 (ref 4.5–9.0)

## 2019-11-27 ENCOUNTER — Encounter: Payer: Self-pay | Admitting: Family Medicine

## 2019-11-27 MED ORDER — TIZANIDINE HCL 4 MG PO TABS: 4.0000 mg | ORAL_TABLET | Freq: Every day | ORAL | 1 refills | Status: DC | PRN

## 2019-12-18 ENCOUNTER — Encounter: Payer: Self-pay | Admitting: Family Medicine

## 2019-12-19 ENCOUNTER — Encounter: Payer: Self-pay | Admitting: Family Medicine

## 2019-12-24 ENCOUNTER — Encounter: Payer: Self-pay | Admitting: *Deleted

## 2020-01-12 ENCOUNTER — Other Ambulatory Visit: Payer: Self-pay | Admitting: Family Medicine

## 2020-01-12 DIAGNOSIS — M1990 Unspecified osteoarthritis, unspecified site: Secondary | ICD-10-CM

## 2020-01-12 DIAGNOSIS — G894 Chronic pain syndrome: Secondary | ICD-10-CM

## 2020-01-12 DIAGNOSIS — F419 Anxiety disorder, unspecified: Secondary | ICD-10-CM

## 2020-01-14 NOTE — Telephone Encounter (Signed)
Called pt to advised that Dr.Jordan will be out of the office until next week. Pt didn't answer so I lm.

## 2020-01-15 NOTE — Telephone Encounter (Signed)
Pt called to go over urine results.  Pt needs a call back at [phone]

## 2020-01-16 NOTE — Telephone Encounter (Signed)
Patient called to go over results  Please advise

## 2020-01-19 NOTE — Telephone Encounter (Signed)
It seems like he received and filled the prescription for hydrocodone on 12/16/2019, 12 tablets.  Because he has a medication contract with me, he is NOTsupposed to request or fill controlled medication from other providers. Please ask him to read med contract. Thanks, BJ

## 2020-01-19 NOTE — Telephone Encounter (Signed)
Returned call to patient. Left message with mother to have him call me back.

## 2020-01-23 NOTE — Telephone Encounter (Signed)
Left message for patient to return call to office. 

## 2020-01-26 NOTE — Telephone Encounter (Signed)
Spoke with patient and went over result notes. Patient verbalized understanding.

## 2020-02-11 ENCOUNTER — Other Ambulatory Visit: Payer: Self-pay | Admitting: Family Medicine

## 2020-02-11 DIAGNOSIS — K219 Gastro-esophageal reflux disease without esophagitis: Secondary | ICD-10-CM

## 2020-02-22 ENCOUNTER — Other Ambulatory Visit: Payer: Self-pay

## 2020-02-22 ENCOUNTER — Emergency Department (HOSPITAL_COMMUNITY): Payer: 59

## 2020-02-22 ENCOUNTER — Ambulatory Visit
Admission: EM | Admit: 2020-02-22 | Discharge: 2020-02-22 | Disposition: A | Discharge status: Home / Self Care | Payer: 59 | Source: Home / Self Care

## 2020-02-22 ENCOUNTER — Encounter (HOSPITAL_COMMUNITY): Payer: Self-pay | Admitting: Emergency Medicine

## 2020-02-22 ENCOUNTER — Emergency Department (HOSPITAL_COMMUNITY)
Admission: EM | Admit: 2020-02-22 | Discharge: 2020-02-22 | Disposition: A | Discharge status: Home / Self Care | Payer: 59 | Attending: Emergency Medicine | Admitting: Emergency Medicine

## 2020-02-22 DIAGNOSIS — E119 Type 2 diabetes mellitus without complications: Secondary | ICD-10-CM | POA: Insufficient documentation

## 2020-02-22 DIAGNOSIS — Y9289 Other specified places as the place of occurrence of the external cause: Secondary | ICD-10-CM | POA: Diagnosis not present

## 2020-02-22 DIAGNOSIS — Z87891 Personal history of nicotine dependence: Secondary | ICD-10-CM | POA: Insufficient documentation

## 2020-02-22 DIAGNOSIS — Y9389 Activity, other specified: Secondary | ICD-10-CM | POA: Insufficient documentation

## 2020-02-22 DIAGNOSIS — S61411A Laceration without foreign body of right hand, initial encounter: Secondary | ICD-10-CM

## 2020-02-22 DIAGNOSIS — Z7982 Long term (current) use of aspirin: Secondary | ICD-10-CM | POA: Diagnosis not present

## 2020-02-22 DIAGNOSIS — W010XXA Fall on same level from slipping, tripping and stumbling without subsequent striking against object, initial encounter: Secondary | ICD-10-CM | POA: Diagnosis not present

## 2020-02-22 DIAGNOSIS — Y998 Other external cause status: Secondary | ICD-10-CM | POA: Insufficient documentation

## 2020-02-22 DIAGNOSIS — S62619A Displaced fracture of proximal phalanx of unspecified finger, initial encounter for closed fracture: Secondary | ICD-10-CM | POA: Diagnosis not present

## 2020-02-22 DIAGNOSIS — W19XXXA Unspecified fall, initial encounter: Secondary | ICD-10-CM

## 2020-02-22 DIAGNOSIS — Z79899 Other long term (current) drug therapy: Secondary | ICD-10-CM | POA: Diagnosis not present

## 2020-02-22 DIAGNOSIS — S60921A Unspecified superficial injury of right hand, initial encounter: Secondary | ICD-10-CM | POA: Diagnosis present

## 2020-02-22 MED ORDER — POVIDONE-IODINE 5 % EX SOLN
Freq: Once | CUTANEOUS | Status: DC
  Filled 2020-02-22: qty 88.7

## 2020-02-22 MED ORDER — HYDROGEN PEROXIDE 3 % EX SOLN
Freq: Every day | CUTANEOUS | Status: DC
  Filled 2020-02-22: qty 473

## 2020-02-22 MED ORDER — STERILE WATER FOR INJECTION IJ SOLN
INTRAMUSCULAR | Status: AC
  Filled 2020-02-22: qty 10

## 2020-02-22 MED ORDER — POVIDONE-IODINE 10 % EX SOLN
CUTANEOUS | Status: AC
  Filled 2020-02-22: qty 15

## 2020-02-22 MED ORDER — LIDOCAINE HCL (PF) 1 % IJ SOLN
30.0000 mL | Freq: Once | INTRAMUSCULAR | Status: AC
  Administered 2020-02-22: 30 mL
  Filled 2020-02-22: qty 30

## 2020-02-22 MED ORDER — CEFAZOLIN SODIUM 1 G IJ SOLR
2.0000 g | Freq: Once | INTRAMUSCULAR | Status: AC
  Administered 2020-02-22: 2 g via INTRAMUSCULAR
  Filled 2020-02-22: qty 20

## 2020-02-22 MED ORDER — DOXYCYCLINE HYCLATE 100 MG PO CAPS: 100.0000 mg | ORAL_CAPSULE | Freq: Two times a day (BID) | ORAL | 0 refills | Status: DC

## 2020-02-22 NOTE — ED Notes (Signed)
Pt with lac to right pinky finger, pt right hand dominant and unable to move right pinky finger.  Tendon exposure, was sent here from Urgent Care.

## 2020-02-22 NOTE — ED Notes (Signed)
Saline soaked gauze placed over lac.

## 2020-02-22 NOTE — ED Triage Notes (Signed)
Assessed laceration.  [place] on a [place] Marketing executive. Weakness in hand.  Concerned for possible tendon involvement.  Advised to go to the ER for further care.  Verbalized understanding.

## 2020-02-22 NOTE — ED Provider Notes (Addendum)
[facility] EMERGENCY DEPARTMENT Provider Note   CSN: [id number] Arrival date & time: 02/22/20  1554     History Chief Complaint  Patient presents with  . Extremity Laceration    Matthew [name] is a 54 y.o. male presents to the ED for evaluation of injury to the right hand.  Patient states he was playing golf and was climbing up some rocks to get the golf ball when he slipped and fell.  He has laceration at the base of his left fifth finger.  Has some decreased range of motion.  Mild pain locally.  Denies any other injuries.  Had 3 beers today.  He is right-hand dominant.  He works at [employer].  HPI     Past Medical History:  Diagnosis Date  . Anxiety   . Depression   . Diabetes mellitus   . GERD (gastroesophageal reflux disease)   . H/O hiatal hernia    ?  Marland Kitchen Mental disorder   . OSA (obstructive sleep apnea)    Not wearing CPAP  . Parotid mass    rt  . Sleep apnea    just started on cpap 2 weeks  . Stones in the urinary tract     Patient Active Problem List   Diagnosis Date Noted  . Whiplash 04/17/2018  . Chronic pain disorder 01/30/2017  . Elevated ALT measurement 01/30/2017  . Insomnia 04/10/2016  . OSA (obstructive sleep apnea) 04/10/2016  . BPH associated with nocturia 07/16/2015  . GERD (gastroesophageal reflux disease) 07/16/2015  . OA (osteoarthritis) 07/16/2015  . Hypogonadism, male 02/21/2013  . Type 2 diabetes mellitus ([place]) 01/24/2013  . Class 2 obesity with body mass index (BMI) of 38.0 to 38.9 in adult 01/24/2013  . Hyperlipidemia associated with type 2 diabetes mellitus ([place]) 01/24/2013  . Anxiety disorder 01/24/2013  . Depression 01/24/2013    Past Surgical History:  Procedure Laterality Date  . HEMATOMA EVACUATION  07/04/2012   Procedure: EVACUATION HEMATOMA;  Surgeon: Melida Quitter, MD;  Location: [place];  Service: ENT;  Laterality: Right;  . PAROTIDECTOMY  07/04/2012   Procedure: PAROTIDECTOMY;  Surgeon: Melida Quitter, MD;   Location: [facility] OR;  Service: ENT;  Laterality: Right;  right parotidectomy       Family History  Problem Relation Age of Onset  . Cancer Mother        breast  . Mental illness Mother   . Diabetes Father   . Stroke Brother     Social History   Tobacco Use  . Smoking status: Former Smoker    Packs/day: 0.25    Years: 5.00    Pack years: 1.25    Types: Cigarettes    Quit date: 05/28/2016    Years since quitting: 3.7  . Smokeless tobacco: Former Systems developer    Types: Chew  . Tobacco comment: marijuana 06/26/12  occ social drinker  Vaping Use  . Vaping Use: Never used  Substance Use Topics  . Alcohol use: Yes    Comment: monthly  . Drug use: Yes    Frequency: 1.0 times per week    Types: Marijuana    Comment: denies recent use    Home Medications Prior to Admission medications   Medication Sig Start Date End Date Taking? Authorizing Provider  amitriptyline (ELAVIL) 100 MG tablet TAKE 1 TABLET BY MOUTH EVERYDAY AT BEDTIME Patient taking differently: Take 100 mg by mouth at bedtime. TAKE 1 TABLET BY MOUTH EVERYDAY AT BEDTIME 09/10/19  Yes [place], Betty G,  MD  aspirin 81 MG tablet Take 81 mg by mouth daily.   Yes [provider]  clonazePAM (KLONOPIN) 2 MG tablet TAKE 1 TABLET BY MOUTH EVERY DAY AS NEEDED FOR ANXIETY 01/27/20  Yes [place], Betty G, MD  meloxicam (MOBIC) 15 MG tablet TAKE 1 TABLET EVERY DAY 09/10/19  Yes [place], Betty G, MD  omeprazole (PRILOSEC) 20 MG capsule TAKE 1 CAPSULE BY MOUTH EVERY DAY 02/11/20  Yes [place], Betty G, MD  rosuvastatin (CRESTOR) 20 MG tablet Take 1 tablet (20 mg total) by mouth daily. 09/17/19  Yes [place], Betty G, MD  tiZANidine (ZANAFLEX) 4 MG tablet Take 1 tablet (4 mg total) by mouth daily as needed for muscle spasms. 11/27/19  Yes [place], Betty G, MD  traMADol (ULTRAM) 50 MG tablet TAKE 1 TABLET (50 MG TOTAL) BY MOUTH EVERY 12 (TWELVE) HOURS AS NEEDED. DUE FOR F/U IN 05/2019 01/27/20  Yes [place], Betty G, MD  doxycycline (VIBRAMYCIN) 100 MG  capsule Take 1 capsule (100 mg total) by mouth 2 (two) times daily. 02/22/20   Kinnie Feil, PA-C    Allergies    Patient has no known allergies.  Review of Systems   Review of Systems  Musculoskeletal: Positive for arthralgias.  Skin: Positive for wound.  All other systems reviewed and are negative.   Physical Exam Updated Vital Signs BP 106/73   Pulse 70   Temp 97.7 F (36.5 C) (Oral)   Resp 16   Ht 5\' 11"  (1.803 m)   Wt 117.9 kg   SpO2 99%   BMI 36.26 kg/m   Physical Exam Constitutional:      Appearance: He is well-developed.  HENT:     Head: Normocephalic.     Nose: Nose normal.  Eyes:     General: Lids are normal.  Cardiovascular:     Rate and Rhythm: Normal rate.     Comments: Brisk cap refill distal right 5th finger tip  Pulmonary:     Effort: Pulmonary effort is normal. No respiratory distress.  Musculoskeletal:        General: Normal range of motion.     Cervical back: Normal range of motion.     Comments: Tendon exposed through 6 cm laceration on palmar aspect of proximal phalanx of 5th right finger.  Deformity at base of proximal phalanx, MCP held in slight flexion.  Decreased extension and flexion at MCP, PIP or DIP.  Decreased strength at these joints.    Skin:    Comments: 5 cm laceration ventral base right 5th finger metacarpal crease. Tendon exposed. Oozing slow dark blood.    Neurological:     Mental Status: He is alert.     Comments: Sensation to light touch, sharp and 2 point discrimination intact in right 5th finger tip   Psychiatric:        Behavior: Behavior normal.         ED Results / Procedures / Treatments   Labs (all labs ordered are listed, but only abnormal results are displayed) Labs Reviewed - No data to display  EKG None  Radiology DG Hand Complete Right  Result Date: 02/22/2020 CLINICAL DATA:  Laceration at base of fifth digit, limited range of motion EXAM: RIGHT HAND - COMPLETE 3+ VIEW COMPARISON:  None.  FINDINGS: Frontal, oblique, lateral views of the right hand are obtained. There is a displaced mildly comminuted fracture at the base of the fifth proximal phalanx, with dorsal angulation at the fracture site. No other acute  bony abnormalities. No radiopaque foreign bodies. Prominent soft tissue swelling at the base of the fifth digit. IMPRESSION: 1. Comminuted displaced fracture at the base of the fifth proximal phalanx. Electronically Signed   By: Randa Ngo M.D.   On: 02/22/2020 17:30    Procedures .Marland KitchenLaceration Repair  Date/Time: 02/22/2020 7:28 PM Performed by: Kinnie Feil, PA-C Authorized by: Kinnie Feil, PA-C   Consent:    Consent obtained:  Verbal   Consent given by:  Patient   Risks discussed:  Infection, need for additional repair, pain, poor cosmetic result and poor wound healing   Alternatives discussed:  No treatment and delayed treatment Universal protocol:    Procedure explained and questions answered to patient or proxy's satisfaction: yes     Relevant documents present and verified: yes     Test results available and properly labeled: yes     Imaging studies available: yes     Required blood products, implants, devices, and special equipment available: yes     Site/side marked: yes     Immediately prior to procedure, a time out was called: yes     Patient identity confirmed:  Verbally with patient Anesthesia (see MAR for exact dosages):    Anesthesia method:  Local infiltration   Local anesthetic:  Lidocaine 1% w/o epi Laceration details:    Location:  Finger   Finger location:  R small finger   Length (cm):  6 Repair type:    Repair type:  Complex Pre-procedure details:    Preparation:  Patient was prepped and draped in usual sterile fashion and imaging obtained to evaluate for foreign bodies Exploration:    Limited defect created (wound extended): no     Hemostasis achieved with:  Direct pressure   Wound exploration: wound explored through full  range of motion and entire depth of wound probed and visualized     Wound extent: tendon damage, underlying fracture and vascular damage     Tendon damage location:  Upper extremity   Tendon repair plan:  Refer for evaluation   Contaminated: no   Treatment:    Area cleansed with:  Betadine and saline (peroxide)   Amount of cleaning:  Extensive   Irrigation volume:  1 L   Irrigation method:  Pressure wash   Visualized foreign bodies/material removed: no     Debridement:  None   Undermining:  None   Scar revision: no   Skin repair:    Repair method:  Sutures   Suture size:  4-0   Suture material:  Prolene   Suture technique:  Simple interrupted   Number of sutures:  6 Approximation:    Approximation:  Loose Post-procedure details:    Dressing:  Non-adherent dressing and splint for protection   Patient tolerance of procedure:  Tolerated well, no immediate complications   (including critical care time)  Medications Ordered in ED Medications  sterile water (preservative free) injection (has no administration in time range)  hydrogen peroxide 3 % external solution (has no administration in time range)  povidone-Iodine (BETADINE) 5 % topical solution (has no administration in time range)  povidone-iodine (BETADINE) 10 % external solution (has no administration in time range)  lidocaine (PF) (XYLOCAINE) 1 % injection 30 mL (30 mLs Infiltration Given by Other 02/22/20 1751)  ceFAZolin (ANCEF) injection 2 g (2 g Intramuscular Given 02/22/20 1812)    ED Course  I have reviewed the triage vital signs and the nursing notes.  Pertinent labs & imaging results  that were available during my care of the patient were reviewed by me and considered in my medical decision making (see chart for details).  Clinical Course as of Feb 21 1929  Nancy Fetter Feb 22, 2020  1802 Spoke to Dr Fredonia Highland. Recommends washing out, loosely closing, ulnar gutter and ancef here. Discharge with keflex.  States patient  should call Dr Biagio Borg office tomorrow morning for follow up    [CG]    Clinical Course User Index [CG] Kinnie Feil, PA-C   MDM Rules/Calculators/A&P                          I have ordered x-ray. Tetanus within 5 years.   Personally reviewed the x-ray and it shows comminuted fracture of the proximal phalanx.  No foreign bodies.  Spoke to Dr. Percell Miller, see above recommendations.  Local anesthetic used prior to thorough irrigation with 1 L NS, peroxide and iodine.  No foreign bodies visualized or palpated.  Full range of motion of the digit performed to evaluate for any obvious tendon injury, none visualized by me here.  However, patient has decreased range of motion of the finger at the MCP, PIP and DIP and unsure if this is due to soft tissue injury or fracture.  Likely the latter.  He has intact distal neuro exam.  No pulsatile bleeding or obvious significant arterial injury.   RN to place dressing and ulnar gutter.  We will discharge with doxycycline, elevation, NSAIDs.  Dr. Percell Miller asked patient to call Dr. Biagio Borg office tomorrow to confirm follow-up in the clinic.  Return precautions given to patient. Final Clinical Impression(s) / ED Diagnoses Final diagnoses:  Fracture of proximal phalanx of finger of right hand  Laceration of right hand without foreign body, initial encounter  Fall, initial encounter    Rx / DC Orders ED Discharge Orders         Ordered    doxycycline (VIBRAMYCIN) 100 MG capsule  2 times daily     Discontinue  Reprint     02/22/20 1925           Kinnie Feil, PA-C 02/22/20 1930    Fredia Sorrow, MD 02/25/20 [phone]

## 2020-02-22 NOTE — ED Triage Notes (Signed)
Patient c/o laceration to right little finger. Per patient trying to crawl up waterfall and cut finger on rock he grabbed. Patient states tetanus vaccination was within 5 years. Small amount of bleeding noted. Significant laceration with tendon exposure noted.

## 2020-02-22 NOTE — Discharge Instructions (Signed)
You were seen in the ED after a fall.  You have a fracture at the base of your right fifth finger.  You had decreased range of motion and this could be from the fracture or it could be from an injury to the tendon in the area  We spoke to our on-call orthopedic surgeon Dr. Percell Miller who recommends following up in the clinic with Dr. Milly Jakob.  Dr. Percell Miller recommended you call the clinic tomorrow to make an appointment.  Dr. Percell Miller also recommended antibiotics.  Take these as prescribed.  Your wound was irrigated today with iodine, normal saline and peroxide.  We loosely closed the wound with 6 stitches.  You were placed in an ulnar gutter.  Keep your hand elevated.  Keep your splint dry.  Alternate ibuprofen or acetaminophen for pain.  Return immediately to the ED if you have sudden severe pain in the hand or finger, coolness or discoloration to the fingertip

## 2020-04-09 ENCOUNTER — Other Ambulatory Visit: Payer: Self-pay | Admitting: Family Medicine

## 2020-04-09 DIAGNOSIS — G4700 Insomnia, unspecified: Secondary | ICD-10-CM

## 2020-04-18 ENCOUNTER — Other Ambulatory Visit: Payer: Self-pay | Admitting: Family Medicine

## 2020-04-18 DIAGNOSIS — M1990 Unspecified osteoarthritis, unspecified site: Secondary | ICD-10-CM

## 2020-04-18 DIAGNOSIS — G894 Chronic pain syndrome: Secondary | ICD-10-CM

## 2020-04-27 ENCOUNTER — Encounter: Payer: Self-pay | Admitting: Family Medicine

## 2020-05-11 ENCOUNTER — Encounter: Payer: Self-pay | Admitting: Family Medicine

## 2020-05-14 ENCOUNTER — Telehealth (INDEPENDENT_AMBULATORY_CARE_PROVIDER_SITE_OTHER): Payer: 59 | Admitting: Family Medicine

## 2020-05-14 ENCOUNTER — Encounter: Payer: Self-pay | Admitting: Family Medicine

## 2020-05-14 VITALS — Ht 71.0 in

## 2020-05-14 DIAGNOSIS — G4700 Insomnia, unspecified: Secondary | ICD-10-CM

## 2020-05-14 DIAGNOSIS — M1990 Unspecified osteoarthritis, unspecified site: Secondary | ICD-10-CM

## 2020-05-14 DIAGNOSIS — G894 Chronic pain syndrome: Secondary | ICD-10-CM

## 2020-05-14 DIAGNOSIS — F419 Anxiety disorder, unspecified: Secondary | ICD-10-CM

## 2020-05-14 DIAGNOSIS — E1169 Type 2 diabetes mellitus with other specified complication: Secondary | ICD-10-CM

## 2020-05-14 MED ORDER — TIZANIDINE HCL 4 MG PO TABS: 4.0000 mg | ORAL_TABLET | Freq: Every day | ORAL | 1 refills | Status: DC | PRN

## 2020-05-14 MED ORDER — CLONAZEPAM 2 MG PO TABS: ORAL_TABLET | ORAL | 2 refills | Status: DC

## 2020-05-14 MED ORDER — TRAMADOL HCL 50 MG PO TABS: 50.0000 mg | ORAL_TABLET | Freq: Two times a day (BID) | ORAL | 2 refills | Status: DC | PRN

## 2020-05-14 NOTE — Progress Notes (Signed)
Virtual Visit via Video Note I connected with Matthew [name] on 05/14/20 by a video enabled telemedicine application and verified that I am speaking with the correct person using two identifiers.  Location patient: home Location provider:work office Persons participating in the virtual visit: patient, provider  I discussed the limitations of evaluation and management by telemedicine and the availability of in person appointments. The patient expressed understanding and agreed to proceed.  HPI: Matthew [name] is a 54 year old male being seen today to follow on some chronic medical problems. He was last seen on 11/24/2019.  DM2: Diagnosed 7 to 8 years ago. Apparently he has discontinued Metformin. He is not checking BS regularly. He states that he is doing well, he is eating healthy and has lost some weight.  Lab Results  Component Value Date   HGBA1C 6.9 (H) 11/24/2019   Lab Results  Component Value Date   CREATININE 0.78 11/24/2019   BUN 15 11/24/2019   NA 138 11/24/2019   K 4.7 11/24/2019   CL 105 11/24/2019   CO2 27 11/24/2019   Chronic pain: Currently he is on tramadol 50 mg twice daily and meloxicam 50 mg daily. Medication is still helping with pain. Zanaflex 4 mg helps with lower back pain. Constant left ankle pain,7/10. Pain is aggravated by prolonged standing and walking. Alleviated by rest.  On 02/22/2020 he was evaluated in the ER because fracture of proximal phalanx right fifth finger. He is a still wearing a splint, having pain and limitation of movement. He is following with orthopedics. He has not received controlled medication from another provider.  Anxiety: Currently he is on clonazepam 2 mg daily. Recent urine tox did not show benzodiazepines, he does understand this, he is taking medication daily.  Urine tox also show marijuana. He denies marijuana use. He is taking CBD Gummies.  Insomnia: He is on Amitriptyline 100 mg at bedtime. He has been on this med at same  dose for many years. Still helps with sleeps. Sleeping about 6-7 hours.  ROS: See pertinent positives and negatives per HPI.  Past Medical History:  Diagnosis Date  . Anxiety   . Depression   . Diabetes mellitus   . GERD (gastroesophageal reflux disease)   . H/O hiatal hernia    ?  Marland Kitchen Mental disorder   . OSA (obstructive sleep apnea)    Not wearing CPAP  . Parotid mass    rt  . Sleep apnea    just started on cpap 2 weeks  . Stones in the urinary tract     Past Surgical History:  Procedure Laterality Date  . HEMATOMA EVACUATION  07/04/2012   Procedure: EVACUATION HEMATOMA;  Surgeon: Melida Quitter, MD;  Location: [place];  Service: ENT;  Laterality: Right;  . PAROTIDECTOMY  07/04/2012   Procedure: PAROTIDECTOMY;  Surgeon: Melida Quitter, MD;  Location: [facility] OR;  Service: ENT;  Laterality: Right;  right parotidectomy    Family History  Problem Relation Age of Onset  . Cancer Mother        breast  . Mental illness Mother   . Diabetes Father   . Stroke Brother     Social History   Socioeconomic History  . Marital status: Single    Spouse name: Not on file  . Number of children: Not on file  . Years of education: Not on file  . Highest education level: Not on file  Occupational History  . Not on file  Tobacco Use  . Smoking status: Former  Smoker    Packs/day: 0.25    Years: 5.00    Pack years: 1.25    Types: Cigarettes    Quit date: 05/28/2016    Years since quitting: 3.9  . Smokeless tobacco: Former Systems developer    Types: Chew  . Tobacco comment: marijuana 06/26/12  occ social drinker  Vaping Use  . Vaping Use: Never used  Substance and Sexual Activity  . Alcohol use: Yes    Comment: monthly  . Drug use: Yes    Frequency: 1.0 times per week    Types: Marijuana    Comment: denies recent use  . Sexual activity: Not on file  Other Topics Concern  . Not on file  Social History Narrative  . Not on file   Social Determinants of Health   Financial Resource Strain:    . Difficulty of Paying Living Expenses: Not on file  Food Insecurity:   . Worried About Charity fundraiser in the Last Year: Not on file  . Ran Out of Food in the Last Year: Not on file  Transportation Needs:   . Lack of Transportation (Medical): Not on file  . Lack of Transportation (Non-Medical): Not on file  Physical Activity:   . Days of Exercise per Week: Not on file  . Minutes of Exercise per Session: Not on file  Stress:   . Feeling of Stress : Not on file  Social Connections:   . Frequency of Communication with Friends and Family: Not on file  . Frequency of Social Gatherings with Friends and Family: Not on file  . Attends Religious Services: Not on file  . Active Member of Clubs or Organizations: Not on file  . Attends Archivist Meetings: Not on file  . Marital Status: Not on file  Intimate Partner Violence:   . Fear of Current or Ex-Partner: Not on file  . Emotionally Abused: Not on file  . Physically Abused: Not on file  . Sexually Abused: Not on file    Current Outpatient Medications:  .  amitriptyline (ELAVIL) 100 MG tablet, TAKE 1 TABLET BY MOUTH EVERYDAY AT BEDTIME, Disp: 90 tablet, Rfl: 1 .  aspirin 81 MG tablet, Take 81 mg by mouth daily., Disp: , Rfl:  .  clonazePAM (KLONOPIN) 2 MG tablet, TAKE 1 TABLET BY MOUTH EVERY DAY AS NEEDED FOR ANXIETY, Disp: 30 tablet, Rfl: 2 .  meloxicam (MOBIC) 15 MG tablet, TAKE 1 TABLET EVERY DAY, Disp: 90 tablet, Rfl: 1 .  omeprazole (PRILOSEC) 20 MG capsule, TAKE 1 CAPSULE BY MOUTH EVERY DAY, Disp: 90 capsule, Rfl: 1 .  rosuvastatin (CRESTOR) 20 MG tablet, Take 1 tablet (20 mg total) by mouth daily., Disp: 90 tablet, Rfl: 2 .  tiZANidine (ZANAFLEX) 4 MG tablet, Take 1 tablet (4 mg total) by mouth daily as needed for muscle spasms., Disp: 90 tablet, Rfl: 1 .  traMADol (ULTRAM) 50 MG tablet, Take 1 tablet (50 mg total) by mouth every 12 (twelve) hours as needed. Due for f/u in 05/2019, Disp: 60 tablet, Rfl:  2  EXAM:  VITALS per patient if applicable:Ht 5\' 11"  (1.803 m)   BMI 36.26 kg/m   GENERAL: alert, oriented, appears well and in no acute distress  HEENT: atraumatic, conjunctiva clear, no obvious abnormalities on inspection.  NECK: normal movements of the head and neck  LUNGS: on inspection no signs of respiratory distress, breathing rate appears normal, no obvious gross SOB, gasping or wheezing  CV: no obvious cyanosis  Matthew:  Limitation of extension 5th right finger.  PSYCH/NEURO: pleasant and cooperative, no obvious depression or anxiety, speech and thought processing grossly intact  ASSESSMENT AND PLAN:  Discussed the following assessment and plan:  Type 2 diabetes mellitus with other specified complication, without long-term current use of insulin (HCC) - Plan: Hemoglobin X2K, BASIC METABOLIC PANEL WITH GFR SKS1N has been < 7.0 Continue non pharmacologic treatment. Will arrange labs and recommendations will be given according to HgA1C Regular exercise and healthy diet with avoidance of added sugar food intake to continue. Annual eye exam and foot care recommended. F/U in 4 months  Osteoarthritis, unspecified osteoarthritis type, unspecified site - Plan: traMADol (ULTRAM) 50 MG tablet Tramadol and Meloxicam still helping. No changes in current management.  Chronic pain disorder - Plan: traMADol (ULTRAM) 50 MG tablet Pain is adequately controlled. Med contract 06/2019. PDMP reviewed. Urine tox 11/2019. We discussed guideline in regard to opioid meds  Anxiety disorder, unspecified type - Plan: clonazePAM (KLONOPIN) 2 MG tablet Stable. No changes in current management. Urine negative for benzos, it this happens again I will not be able to continue medication.  Insomnia, unspecified type Problem is adequately controlled. Continue Amitriptyline 100 mg daily.  I discussed the assessment and treatment plan with the patient. Matthew [name] was provided an opportunity to ask  questions and all were answered. He agreed with the plan and demonstrated an understanding of the instructions.  Return in about 4 months (around 09/14/2020) for Also needs lab appt, next week does not have to be fasting..    Ghalia Reicks [place], MD

## 2020-06-14 ENCOUNTER — Other Ambulatory Visit: Payer: Self-pay | Admitting: Family Medicine

## 2020-06-14 DIAGNOSIS — K219 Gastro-esophageal reflux disease without esophagitis: Secondary | ICD-10-CM

## 2020-06-14 DIAGNOSIS — E1169 Type 2 diabetes mellitus with other specified complication: Secondary | ICD-10-CM

## 2020-06-14 DIAGNOSIS — E785 Hyperlipidemia, unspecified: Secondary | ICD-10-CM

## 2020-08-24 ENCOUNTER — Encounter: Payer: Self-pay | Admitting: Family Medicine

## 2020-09-09 ENCOUNTER — Encounter: Payer: Self-pay | Admitting: Family Medicine

## 2020-09-09 DIAGNOSIS — G894 Chronic pain syndrome: Secondary | ICD-10-CM

## 2020-09-09 DIAGNOSIS — M1990 Unspecified osteoarthritis, unspecified site: Secondary | ICD-10-CM

## 2020-09-09 DIAGNOSIS — F419 Anxiety disorder, unspecified: Secondary | ICD-10-CM

## 2020-09-10 MED ORDER — TRAMADOL HCL 50 MG PO TABS: 50.0000 mg | ORAL_TABLET | Freq: Two times a day (BID) | ORAL | 0 refills | Status: DC | PRN

## 2020-09-10 MED ORDER — CLONAZEPAM 2 MG PO TABS: ORAL_TABLET | ORAL | 0 refills | Status: DC

## 2020-09-10 NOTE — Telephone Encounter (Signed)
Both last filled : 07/24/20 Last OV: 05/2020 Due for F/U : 09/2020

## 2020-10-04 ENCOUNTER — Other Ambulatory Visit: Payer: Self-pay | Admitting: Family Medicine

## 2020-10-04 DIAGNOSIS — G4700 Insomnia, unspecified: Secondary | ICD-10-CM

## 2020-10-26 ENCOUNTER — Encounter: Payer: Self-pay | Admitting: Family Medicine

## 2020-10-26 DIAGNOSIS — M1990 Unspecified osteoarthritis, unspecified site: Secondary | ICD-10-CM

## 2020-10-26 DIAGNOSIS — G894 Chronic pain syndrome: Secondary | ICD-10-CM

## 2020-10-26 DIAGNOSIS — F419 Anxiety disorder, unspecified: Secondary | ICD-10-CM

## 2020-10-29 ENCOUNTER — Other Ambulatory Visit: Payer: Self-pay | Admitting: Family Medicine

## 2020-10-29 DIAGNOSIS — G894 Chronic pain syndrome: Secondary | ICD-10-CM

## 2020-10-29 DIAGNOSIS — M1990 Unspecified osteoarthritis, unspecified site: Secondary | ICD-10-CM

## 2020-11-01 MED ORDER — TRAMADOL HCL 50 MG PO TABS: 50.0000 mg | ORAL_TABLET | Freq: Two times a day (BID) | ORAL | 0 refills | Status: DC | PRN

## 2020-11-01 NOTE — Telephone Encounter (Signed)
When pt requested a refill on 09/10/20 he was advised to make a f/u appt, but did not do so until some time this month.  Per chart review pt also had a neg UDS last yr.  Unable to refill Klonopin.  Limited refill on tramadol.

## 2020-11-12 ENCOUNTER — Encounter: Payer: Self-pay | Admitting: Family Medicine

## 2020-11-12 DIAGNOSIS — F419 Anxiety disorder, unspecified: Secondary | ICD-10-CM

## 2020-11-16 MED ORDER — CLONAZEPAM 2 MG PO TABS: ORAL_TABLET | ORAL | 0 refills | Status: DC

## 2020-11-22 ENCOUNTER — Ambulatory Visit (INDEPENDENT_AMBULATORY_CARE_PROVIDER_SITE_OTHER): Payer: 59 | Admitting: Family Medicine

## 2020-11-22 ENCOUNTER — Other Ambulatory Visit: Payer: Self-pay

## 2020-11-22 ENCOUNTER — Encounter: Payer: Self-pay | Admitting: Family Medicine

## 2020-11-22 VITALS — BP 120/80 | HR 105 | Temp 98.7°F | Resp 16 | Ht 71.0 in | Wt 280.2 lb

## 2020-11-22 DIAGNOSIS — Z79891 Long term (current) use of opiate analgesic: Secondary | ICD-10-CM

## 2020-11-22 DIAGNOSIS — F419 Anxiety disorder, unspecified: Secondary | ICD-10-CM

## 2020-11-22 DIAGNOSIS — E1169 Type 2 diabetes mellitus with other specified complication: Secondary | ICD-10-CM

## 2020-11-22 DIAGNOSIS — M1990 Unspecified osteoarthritis, unspecified site: Secondary | ICD-10-CM

## 2020-11-22 DIAGNOSIS — G4700 Insomnia, unspecified: Secondary | ICD-10-CM | POA: Diagnosis not present

## 2020-11-22 DIAGNOSIS — R7401 Elevation of levels of liver transaminase levels: Secondary | ICD-10-CM

## 2020-11-22 DIAGNOSIS — E785 Hyperlipidemia, unspecified: Secondary | ICD-10-CM | POA: Diagnosis not present

## 2020-11-22 DIAGNOSIS — G894 Chronic pain syndrome: Secondary | ICD-10-CM

## 2020-11-22 LAB — HEPATIC FUNCTION PANEL
ALT: 49 U/L (ref 0–53)
AST: 28 U/L (ref 0–37)
Albumin: 4.5 g/dL (ref 3.5–5.2)
Alkaline Phosphatase: 58 U/L (ref 39–117)
Bilirubin, Direct: 0.1 mg/dL (ref 0.0–0.3)
Total Bilirubin: 0.5 mg/dL (ref 0.2–1.2)
Total Protein: 7 g/dL (ref 6.0–8.3)

## 2020-11-22 LAB — BASIC METABOLIC PANEL
BUN: 12 mg/dL (ref 6–23)
CO2: 29 mEq/L (ref 19–32)
Calcium: 9.7 mg/dL (ref 8.4–10.5)
Chloride: 103 mEq/L (ref 96–112)
Creatinine, Ser: 0.83 mg/dL (ref 0.40–1.50)
GFR: 97.83 mL/min (ref >60.00)
Glucose, Bld: 121 mg/dL — ABNORMAL HIGH (ref 70–99)
Potassium: 4.7 mEq/L (ref 3.5–5.1)
Sodium: 140 mEq/L (ref 135–145)

## 2020-11-22 LAB — LIPID PANEL
Cholesterol: 142 mg/dL (ref 0–200)
HDL: 53 mg/dL (ref >39.00)
LDL Cholesterol: 55 mg/dL (ref 0–99)
NonHDL: 89.42
Total CHOL/HDL Ratio: 3
Triglycerides: 172 mg/dL — ABNORMAL HIGH (ref 0.0–149.0)
VLDL: 34.4 mg/dL (ref 0.0–40.0)

## 2020-11-22 LAB — HEMOGLOBIN A1C: Hgb A1c MFr Bld: 7.6 % — ABNORMAL HIGH (ref 4.6–6.5)

## 2020-11-22 MED ORDER — CLONAZEPAM 2 MG PO TABS: ORAL_TABLET | ORAL | 2 refills | Status: DC

## 2020-11-22 MED ORDER — TRAMADOL HCL 50 MG PO TABS: 50.0000 mg | ORAL_TABLET | Freq: Two times a day (BID) | ORAL | 2 refills | Status: DC | PRN

## 2020-11-22 NOTE — Assessment & Plan Note (Signed)
He has gained about 4 Lb since his last visit,11/2019. We discussed benefits of wt loss as well as adverse effects of obesity. Consistency with healthy diet and physical activity recommended.

## 2020-11-22 NOTE — Progress Notes (Signed)
HPI: Matthew [name] is a 55 y.o. male, who is here today for follow up.   He was last seen on 05/14/2020, virtual visit.  DM2: Diagnosed 7 5 years ago. Currently he is on nonpharmacologic treatment. Took Metformin until 04/2020.  Is not checking BS. Negative for abdominal pain, nausea, vomiting ,polydipsia, polyuria, polyphagia.  He has gained some weight. He is not exercising regularly but he is active at work.  Lab Results  Component Value Date   HGBA1C 6.9 (H) 11/24/2019   Lab Results  Component Value Date   MICROALBUR 0.7 11/24/2019   MICROALBUR <0.7 07/31/2017   Lab Results  Component Value Date   CREATININE 0.78 11/24/2019   BUN 15 11/24/2019   NA 138 11/24/2019   K 4.7 11/24/2019   CL 105 11/24/2019   CO2 27 11/24/2019   Hyperlipidemia: Currently he is on rosuvastatin 20 mg daily. He is tolerating medication well.  Lab Results  Component Value Date   CHOL 98 11/24/2019   HDL 45.90 11/24/2019   LDLCALC 41 11/24/2019   LDLDIRECT 60.0 06/25/2019   TRIG 54.0 11/24/2019   CHOLHDL 2 11/24/2019   Chronic pain: Currently he is on tramadol 50 mg twice daily as needed. Medication is still helping with pain. He is also on meloxicam 15 mg daily. Urine tox has been positive for marijuana in the past. He acknowledges he is smoking marijuana 3 times per week and taking "delta-7" Gummies. Marijuana helps with anxiety and his sleep. He denies use of other illegal drugs.  Right hip pain , anterior aspect, usually at work with prolonged walking on concrete at work.. Alleviated by rest. No hx of trauma. He has no pain during his days off.  Has had elevated transaminases in the past. Negative for stool/urine color changes or jaundice.  Lab Results  Component Value Date   ALT 39 06/25/2019   AST 24 06/25/2019   ALKPHOS 69 06/25/2019   BILITOT 0.4 06/25/2019   Insomnia: Currently he is on amitriptyline 100 mg daily. He has had some difficulty falling asleep  for the past few weeks, his "mind is racing." He has tried melatonin in the past but he does not like it, he feels drowsy the next day. Sleeping about 6 hours.  Review of Systems  Constitutional: Positive for fatigue. Negative for activity change, appetite change and fever.  HENT: Negative for nosebleeds and sore throat.   Respiratory: Negative for cough, shortness of breath and wheezing.   Cardiovascular: Negative for chest pain, palpitations and leg swelling.  Genitourinary: Negative for decreased urine volume, dysuria and hematuria.  Musculoskeletal: Positive for arthralgias.  Neurological: Negative for syncope, weakness and headaches.  Psychiatric/Behavioral: Positive for sleep disturbance. Negative for confusion and hallucinations.  Rest of ROS, see pertinent positives sand negatives in HPI  Current Outpatient Medications on File Prior to Visit  Medication Sig Dispense Refill  . amitriptyline (ELAVIL) 100 MG tablet TAKE 1 TABLET BY MOUTH EVERYDAY AT BEDTIME 90 tablet 1  . aspirin 81 MG tablet Take 81 mg by mouth daily.    . meloxicam (MOBIC) 15 MG tablet TAKE 1 TABLET EVERY DAY 90 tablet 1  . omeprazole (PRILOSEC) 20 MG capsule TAKE 1 CAPSULE BY MOUTH EVERY DAY 90 capsule 1  . rosuvastatin (CRESTOR) 20 MG tablet TAKE 1 TABLET BY MOUTH EVERY DAY 90 tablet 2  . tiZANidine (ZANAFLEX) 4 MG tablet TAKE 1 TABLET BY MOUTH DAILY AS NEEDED FOR MUSCLE SPASMS. 90 tablet 1  No current facility-administered medications on file prior to visit.   Past Medical History:  Diagnosis Date  . Anxiety   . Depression   . Diabetes mellitus   . GERD (gastroesophageal reflux disease)   . H/O hiatal hernia    ?  Marland Kitchen Mental disorder   . OSA (obstructive sleep apnea)    Not wearing CPAP  . Parotid mass    rt  . Sleep apnea    just started on cpap 2 weeks  . Stones in the urinary tract    No Known Allergies  Social History   Socioeconomic History  . Marital status: Single    Spouse name: Not  on file  . Number of children: Not on file  . Years of education: Not on file  . Highest education level: Not on file  Occupational History  . Not on file  Tobacco Use  . Smoking status: Former Smoker    Packs/day: 0.25    Years: 5.00    Pack years: 1.25    Types: Cigarettes    Quit date: 05/28/2016    Years since quitting: 4.4  . Smokeless tobacco: Former Systems developer    Types: Chew  . Tobacco comment: marijuana 06/26/12  occ social drinker  Vaping Use  . Vaping Use: Never used  Substance and Sexual Activity  . Alcohol use: Yes    Comment: monthly  . Drug use: Yes    Frequency: 1.0 times per week    Types: Marijuana    Comment: denies recent use  . Sexual activity: Not on file  Other Topics Concern  . Not on file  Social History Narrative  . Not on file   Social Determinants of Health   Financial Resource Strain: Not on file  Food Insecurity: Not on file  Transportation Needs: Not on file  Physical Activity: Not on file  Stress: Not on file  Social Connections: Not on file   Vitals:   11/22/20 0824  BP: 120/80  Pulse: (!) 105  Resp: 16  Temp: 98.7 F (37.1 C)  SpO2: 97%   Wt Readings from Last 3 Encounters:  11/22/20 280 lb 4 oz (127.1 kg)  02/22/20 260 lb (117.9 kg)  11/24/19 276 lb 8 oz (125.4 kg)   Body mass index is 39.09 kg/m.  Physical Exam Vitals and nursing note reviewed.  Constitutional:      General: He is not in acute distress.    Appearance: He is well-developed.  HENT:     Head: Normocephalic and atraumatic.     Mouth/Throat:     Mouth: Mucous membranes are moist.  Eyes:     Conjunctiva/sclera: Conjunctivae normal.  Cardiovascular:     Rate and Rhythm: Regular rhythm. Tachycardia present.     Pulses:          Dorsalis pedis pulses are 2+ on the right side and 2+ on the left side.     Heart sounds: No murmur heard.   Pulmonary:     Effort: Pulmonary effort is normal. No respiratory distress.     Breath sounds: Normal breath sounds.   Abdominal:     Palpations: Abdomen is soft. There is no hepatomegaly or mass.     Tenderness: There is no abdominal tenderness.  Musculoskeletal:     Right hip: No tenderness or bony tenderness. Decreased range of motion (Mild, internal rotation and flexion).     Comments: No signs of synovitis. Antalgic gait.  Lymphadenopathy:     Cervical: No  cervical adenopathy.  Skin:    General: Skin is warm.     Findings: No erythema or rash.  Neurological:     General: No focal deficit present.     Mental Status: He is alert and oriented to person, place, and time.     Cranial Nerves: No cranial nerve deficit.  Psychiatric:     Comments: Good eye contact.    Diabetic Foot Exam - Simple   Simple Foot Form Diabetic Foot exam was performed with the following findings: Yes 11/22/2020  2:57 PM  Visual Inspection See comments: Yes Sensation Testing Intact to touch and monofilament testing bilaterally: Yes Pulse Check Posterior Tibialis and Dorsalis pulse intact bilaterally: Yes Comments Calluses plantar, long toe nails bilateral.    ASSESSMENT AND PLAN:  Matthew [name] was seen today for follow-up.  Orders Placed This Encounter  Procedures  . Basic metabolic panel  . Hepatic function panel  . Hemoglobin A1c  . Fructosamine  . Lipid panel  . Microalbumin / creatinine urine ratio  . DRUG MONITORING, PANEL 8 WITH CONFIRMATION, URINE   Lab Results  Component Value Date   HGBA1C 7.6 (H) 11/22/2020   Lab Results  Component Value Date   CREATININE 0.83 11/22/2020   BUN 12 11/22/2020   NA 140 11/22/2020   K 4.7 11/22/2020   CL 103 11/22/2020   CO2 29 11/22/2020   Lab Results  Component Value Date   CHOL 142 11/22/2020   HDL 53.00 11/22/2020   LDLCALC 55 11/22/2020   LDLDIRECT 60.0 06/25/2019   TRIG 172.0 (H) 11/22/2020   CHOLHDL 3 11/22/2020   Lab Results  Component Value Date   ALT 49 11/22/2020   AST 28 11/22/2020   ALKPHOS 58 11/22/2020   BILITOT 0.5  11/22/2020    Insomnia Having some difficulty falling asleep for the past few days. For now continue Amitriptyline 100 mg at bedtime. Adequate sleep hygiene. He does not want to try Melatonin again.  Hyperlipidemia associated with type 2 diabetes mellitus (HCC) Continue Crestor 20 mg daily. Low fat diet also recommended. Further recommendations according to FLP results.  Elevated ALT measurement Problem has been intermittent. Wt loss and low fat diet may help. Further recommendations according to LFT's result.  Type 2 diabetes mellitus with other specified complication ([place]) [id number] has been at goal. Continue non pharmacologic treatment. Regular exercise and healthy diet with avoidance of added sugar food intake is an important part of treatment and recommended. Annual eye exam, periodic dental and foot care recommended.   Morbid obesity ([place]) He has gained about 4 Lb since his last visit,11/2019. We discussed benefits of wt loss as well as adverse effects of obesity. Consistency with healthy diet and physical activity recommended.  Anxiety disorder Problem is stable. Continue Clonazepam 2 mg at bedtime prn.  Chronic pain disorder Pain seems to be adequately controlled. We discussed guidelines in regard to pain management with Tramadol and controlled medications in general. Med contract signed today. Urine tox done today.    OA (osteoarthritis) Tramadol and meloxicam are still helping with pain. We discussed some side effects of medications. Weight loss will help with progression of disease. No changes in current management.  Chronically on opiate therapy - DRUG MONITORING, PANEL 8 WITH CONFIRMATION, URINE; Future  Spent 42 minutes.  During this time history was obtained and documented, examination was performed, prior labs reviewed, and assessment/plan discussed.  Return in about 4 months (around 03/24/2021) for pain and anxiety.Inez Catalina  G. Martinique, MD  [facility]. [place] office.  A few things to remember from today's visit:   Chronically on opiate therapy - Plan: DRUG MONITORING, PANEL 8 WITH CONFIRMATION, URINE, CANCELED: DRUG MONITORING, PANEL 8 WITH CONFIRMATION, URINE  Type 2 diabetes mellitus with other specified complication, without long-term current use of insulin ([place]) - Plan: Basic metabolic panel, Hemoglobin A1c, Fructosamine, Microalbumin / creatinine urine ratio  Anxiety disorder, unspecified type  Insomnia, unspecified type  Hyperlipidemia associated with type 2 diabetes mellitus ([place]) - Plan: Basic metabolic panel, Lipid panel  Elevated ALT measurement - Plan: Hepatic function panel  If you need refills please call your pharmacy. Do not use My Chart to request refills or for acute issues that need immediate attention.   No changes today. Marijuana is still considered an illegal drug , so it needs to be discontinued.  Please be sure medication list is accurate. If a new problem present, please set up appointment sooner than planned today.

## 2020-11-22 NOTE — Patient Instructions (Addendum)
A few things to remember from today's visit:   Chronically on opiate therapy - Plan: DRUG MONITORING, PANEL 8 WITH CONFIRMATION, URINE, CANCELED: DRUG MONITORING, PANEL 8 WITH CONFIRMATION, URINE  Type 2 diabetes mellitus with other specified complication, without long-term current use of insulin ([place]) - Plan: Basic metabolic panel, Hemoglobin A1c, Fructosamine, Microalbumin / creatinine urine ratio  Anxiety disorder, unspecified type  Insomnia, unspecified type  Hyperlipidemia associated with type 2 diabetes mellitus ([place]) - Plan: Basic metabolic panel, Lipid panel  Elevated ALT measurement - Plan: Hepatic function panel  If you need refills please call your pharmacy. Do not use My Chart to request refills or for acute issues that need immediate attention.   No changes today. Marijuana is still considered an illegal drug , so it needs to be discontinued.  Please be sure medication list is accurate. If a new problem present, please set up appointment sooner than planned today.

## 2020-11-22 NOTE — Assessment & Plan Note (Addendum)
Pain seems to be adequately controlled. We discussed guidelines in regard to pain management with Tramadol and controlled medications in general. Med contract signed today. Urine tox done today.

## 2020-11-22 NOTE — Assessment & Plan Note (Signed)
Continue Crestor 20 mg daily. Low fat diet also recommended. Further recommendations according to FLP results.

## 2020-11-22 NOTE — Assessment & Plan Note (Addendum)
Having some difficulty falling asleep for the past few days. For now continue Amitriptyline 100 mg at bedtime. Adequate sleep hygiene. He does not want to try Melatonin again.

## 2020-11-22 NOTE — Assessment & Plan Note (Signed)
Problem has been intermittent. Wt loss and low fat diet may help. Further recommendations according to LFT's result.

## 2020-11-22 NOTE — Assessment & Plan Note (Signed)
Tramadol and meloxicam are still helping with pain. We discussed some side effects of medications. Weight loss will help with progression of disease. No changes in current management.

## 2020-11-22 NOTE — Assessment & Plan Note (Addendum)
HgA1C has been at goal. Continue non pharmacologic treatment. Regular exercise and healthy diet with avoidance of added sugar food intake is an important part of treatment and recommended. Annual eye exam, periodic dental and foot care recommended.

## 2020-11-22 NOTE — Assessment & Plan Note (Signed)
Problem is stable. Continue Clonazepam 2 mg at bedtime prn.

## 2020-11-24 NOTE — Addendum Note (Signed)
Addended by: Tessie Fass D on: 11/24/2020 12:51 PM   Modules accepted: Orders

## 2020-11-25 LAB — FRUCTOSAMINE: Fructosamine: 264 umol/L (ref 205–285)

## 2020-12-21 ENCOUNTER — Other Ambulatory Visit: Payer: Self-pay | Admitting: Family Medicine

## 2020-12-21 DIAGNOSIS — K219 Gastro-esophageal reflux disease without esophagitis: Secondary | ICD-10-CM

## 2021-01-26 ENCOUNTER — Other Ambulatory Visit: Payer: Self-pay | Admitting: Family Medicine

## 2021-01-26 DIAGNOSIS — G4700 Insomnia, unspecified: Secondary | ICD-10-CM

## 2021-01-26 DIAGNOSIS — E1169 Type 2 diabetes mellitus with other specified complication: Secondary | ICD-10-CM

## 2021-01-26 DIAGNOSIS — E785 Hyperlipidemia, unspecified: Secondary | ICD-10-CM

## 2021-02-08 ENCOUNTER — Other Ambulatory Visit: Payer: Self-pay | Admitting: Family Medicine

## 2021-02-08 DIAGNOSIS — E1169 Type 2 diabetes mellitus with other specified complication: Secondary | ICD-10-CM

## 2021-02-08 DIAGNOSIS — E785 Hyperlipidemia, unspecified: Secondary | ICD-10-CM

## 2021-03-09 ENCOUNTER — Other Ambulatory Visit: Payer: Self-pay | Admitting: Family Medicine

## 2021-03-09 DIAGNOSIS — G4700 Insomnia, unspecified: Secondary | ICD-10-CM

## 2021-03-09 DIAGNOSIS — M1990 Unspecified osteoarthritis, unspecified site: Secondary | ICD-10-CM

## 2021-03-09 DIAGNOSIS — G894 Chronic pain syndrome: Secondary | ICD-10-CM

## 2021-05-07 ENCOUNTER — Other Ambulatory Visit: Payer: Self-pay | Admitting: Family Medicine

## 2021-05-07 DIAGNOSIS — K219 Gastro-esophageal reflux disease without esophagitis: Secondary | ICD-10-CM

## 2021-05-30 NOTE — Progress Notes (Signed)
HPI: [name] is a 55 y.o. male, who is here today for 5 months follow up.   He was last seen on 11/22/20. No new problems since his last visit.  Diabetes Mellitus II: Dx'ed 7-8 years ago. - Checking BG at home: He is not checking. - Medications: Metformin 500 mg bid with food. He forgets taking med 2-3 times per week. - Compliance: Fair. - Diet: Trying to decrease soda intake and increasing water intake. - Exercise: He doe snot have an exercise routine but he is active at work. - eye exam: 08/2020. - foot exam: 11/2020.  - Negative for symptoms of hypoglycemia, polyuria, polydipsia, numbness extremities, foot ulcers/trauma  Lab Results  Component Value Date   HGBA1C 7.6 (H) 11/22/2020   Lab Results  Component Value Date   MICROALBUR 0.7 11/24/2019   ED for years. He is requesting refill for Viagra, it was prescribed by urologist years ago. Denies side effects and it has helped with erections.  Anxiety and insomnia: He is on Amitriptyline 100 mg at bedtime and Clonazepam 2 mg at night. He has not taken Clonazepam in 2 weeks.Last refill in 02/2021. Medication helps with anxiety.  Sleeping about 6 hours. OSA not on CPAP.  Chronic pain:  Severe achy ankle and IP feet pain. S/P left ankle surgery  Pain 7/10. Pain is exacerbated by prolonged walking and standing. He ran out ot Tramadol a few weeks ago and has been taking 2 Meloxicam tabs at bedtime. He keeps medication in a safe place and denies illegal drug use.  Review of Systems  Constitutional:  Negative for activity change, appetite change and fever.  HENT:  Negative for nosebleeds and sore throat.   Respiratory:  Negative for cough and wheezing.   Gastrointestinal:  Negative for abdominal pain, nausea and vomiting.  Genitourinary:  Negative for decreased urine volume and hematuria.  Musculoskeletal:  Positive for arthralgias. Negative for gait problem.  Neurological:  Negative for syncope, facial  asymmetry and weakness.  Psychiatric/Behavioral:  Positive for sleep disturbance. Negative for confusion. The patient is nervous/anxious.   Rest of ROS, see pertinent positives sand negatives in HPI  Current Outpatient Medications on File Prior to Visit  Medication Sig Dispense Refill   amitriptyline (ELAVIL) 100 MG tablet TAKE 1 TABLET BY MOUTH EVERYDAY AT BEDTIME 90 tablet 1   aspirin 81 MG tablet Take 81 mg by mouth daily.     meloxicam (MOBIC) 15 MG tablet TAKE 1 TABLET BY MOUTH EVERY DAY 90 tablet 1   omeprazole (PRILOSEC) 20 MG capsule TAKE 1 CAPSULE BY MOUTH EVERY DAY 90 capsule 1   rosuvastatin (CRESTOR) 20 MG tablet TAKE 1 TABLET BY MOUTH EVERY DAY 90 tablet 2   tiZANidine (ZANAFLEX) 4 MG tablet TAKE 1 TABLET BY MOUTH DAILY AS NEEDED FOR MUSCLE SPASMS. 90 tablet 1   No current facility-administered medications on file prior to visit.   Past Medical History:  Diagnosis Date   Anxiety    Depression    Diabetes mellitus    GERD (gastroesophageal reflux disease)    H/O hiatal hernia    ?   Mental disorder    OSA (obstructive sleep apnea)    Not wearing CPAP   Parotid mass    rt   Sleep apnea    just started on cpap 2 weeks   Stones in the urinary tract    No Known Allergies  Social History   Socioeconomic History   Marital status: Single  Spouse name: Not on file   Number of children: Not on file   Years of education: Not on file   Highest education level: Not on file  Occupational History   Not on file  Tobacco Use   Smoking status: Former    Packs/day: 0.25    Years: 5.00    Pack years: 1.25    Types: Cigarettes    Quit date: 05/28/2016    Years since quitting: 5.0   Smokeless tobacco: Former    Types: Chew   Tobacco comments:    marijuana 06/26/12  occ social drinker  Vaping Use   Vaping Use: Never used  Substance and Sexual Activity   Alcohol use: Yes    Comment: monthly   Drug use: Yes    Frequency: 1.0 times per week    Types: Marijuana     Comment: denies recent use   Sexual activity: Not on file  Other Topics Concern   Not on file  Social History Narrative   Not on file   Social Determinants of Health   Financial Resource Strain: Not on file  Food Insecurity: Not on file  Transportation Needs: Not on file  Physical Activity: Not on file  Stress: Not on file  Social Connections: Not on file   Vitals:   05/31/21 0730  BP: 120/70  Pulse: 100  Resp: 16  SpO2: 97%   Body mass index is 37.95 kg/m.  Physical Exam Nursing note reviewed.  Constitutional:      General: He is not in acute distress.    Appearance: He is well-developed.  HENT:     Head: Normocephalic and atraumatic.  Eyes:     Conjunctiva/sclera: Conjunctivae normal.  Cardiovascular:     Rate and Rhythm: Normal rate and regular rhythm.     Pulses:          Dorsalis pedis pulses are 2+ on the right side and 2+ on the left side.     Heart sounds: No murmur heard. Pulmonary:     Effort: Pulmonary effort is normal. No respiratory distress.     Breath sounds: Normal breath sounds.  Abdominal:     Palpations: Abdomen is soft. There is no hepatomegaly or mass.     Tenderness: There is no abdominal tenderness.  Lymphadenopathy:     Cervical: No cervical adenopathy.  Skin:    General: Skin is warm.     Findings: No erythema or rash.  Neurological:     Mental Status: He is alert and oriented to person, place, and time.     Cranial Nerves: No cranial nerve deficit.     Gait: Gait normal.  Psychiatric:     Comments: Well groomed, good eye contact.   ASSESSMENT AND PLAN:  [name] was seen today for 6 months follow-up.  Orders Placed This Encounter  Procedures   Flu Vaccine QUAD 70mo+IM (Fluarix, Fluzone & Alfiuria Quad PF)   Basic metabolic panel   Ambulatory referral to Gastroenterology   POC HgB A1c   Lab Results  Component Value Date   HGBA1C 7.3 (A) 05/31/2021   Lab Results  Component Value Date   CREATININE 0.87  05/31/2021   BUN 15 05/31/2021   NA 138 05/31/2021   K 4.2 05/31/2021   CL 102 05/31/2021   CO2 25 05/31/2021   Type 2 diabetes mellitus with other specified complication ([place]) [id number] improved but still abod goal. Not complaint with treatment, he was instructed to take Metformin  500 mg bid. Regular exercise and healthy diet with avoidance of added sugar food intake is an important part of treatment and recommended. Annual eye exam, periodic dental and foot care recommended. F/U in 5-6 months   Chronic pain disorder Side effects of Tramadol as well as current recommendations in regard to chronic use of opioids for pain management. [place] controlled substance reviewed. Med contract 11/2020.  Anxiety disorder Stable. Continue Clonazepam 2 mg at bedtime as needed.  Insomnia Well controlled with Amitriptyline 100 mg at bedtime. No changes in current management. Good sleep hygiene also encouraged.  Erectile dysfunction, unspecified erectile dysfunction type Viagra 100 mg to take 1/2-1 tab daily prn before sex intercourse. We discussed side effects.  Osteoarthritis, unspecified osteoarthritis type, unspecified site Stable. Continue Tramadol 50 mg bid and Meloxicam 15 mg daily prn. We has discussed side effects of chronic NSAID's, instructed not to take more than one tab daily.  - traMADol (ULTRAM) 50 MG tablet; Take 1 tablet (50 mg total) by mouth every 12 (twelve) hours as needed.  Dispense: 60 tablet; Refill: 2  Need for influenza vaccination - Flu Vaccine QUAD 68mo+IM (Fluarix, Fluzone & Alfiuria Quad PF)  Colon cancer screening - Ambulatory referral to Gastroenterology  Return in about 5 months (around 10/29/2021).   Matthew Granlund G. Martinique, MD  [facility]. [place] office.

## 2021-05-31 ENCOUNTER — Other Ambulatory Visit: Payer: Self-pay

## 2021-05-31 ENCOUNTER — Ambulatory Visit (INDEPENDENT_AMBULATORY_CARE_PROVIDER_SITE_OTHER): Payer: 59 | Admitting: Family Medicine

## 2021-05-31 ENCOUNTER — Encounter: Payer: Self-pay | Admitting: Family Medicine

## 2021-05-31 VITALS — BP 120/70 | HR 100 | Resp 16 | Ht 71.0 in | Wt 272.1 lb

## 2021-05-31 DIAGNOSIS — G894 Chronic pain syndrome: Secondary | ICD-10-CM

## 2021-05-31 DIAGNOSIS — Z23 Encounter for immunization: Secondary | ICD-10-CM

## 2021-05-31 DIAGNOSIS — N529 Male erectile dysfunction, unspecified: Secondary | ICD-10-CM

## 2021-05-31 DIAGNOSIS — E1169 Type 2 diabetes mellitus with other specified complication: Secondary | ICD-10-CM

## 2021-05-31 DIAGNOSIS — G4700 Insomnia, unspecified: Secondary | ICD-10-CM

## 2021-05-31 DIAGNOSIS — M1990 Unspecified osteoarthritis, unspecified site: Secondary | ICD-10-CM | POA: Diagnosis not present

## 2021-05-31 DIAGNOSIS — Z1211 Encounter for screening for malignant neoplasm of colon: Secondary | ICD-10-CM

## 2021-05-31 DIAGNOSIS — F419 Anxiety disorder, unspecified: Secondary | ICD-10-CM

## 2021-05-31 LAB — BASIC METABOLIC PANEL
BUN: 15 mg/dL (ref 6–23)
CO2: 25 mEq/L (ref 19–32)
Calcium: 9.6 mg/dL (ref 8.4–10.5)
Chloride: 102 mEq/L (ref 96–112)
Creatinine, Ser: 0.87 mg/dL (ref 0.40–1.50)
GFR: 96.09 mL/min (ref >60.00)
Glucose, Bld: 160 mg/dL — ABNORMAL HIGH (ref 70–99)
Potassium: 4.2 mEq/L (ref 3.5–5.1)
Sodium: 138 mEq/L (ref 135–145)

## 2021-05-31 LAB — POCT GLYCOSYLATED HEMOGLOBIN (HGB A1C): Hemoglobin A1C: 7.3 % — AB (ref 4.0–5.6)

## 2021-05-31 MED ORDER — TRAMADOL HCL 50 MG PO TABS: 50.0000 mg | ORAL_TABLET | Freq: Two times a day (BID) | ORAL | 2 refills | Status: DC | PRN

## 2021-05-31 MED ORDER — METFORMIN HCL 500 MG PO TABS: 500.0000 mg | ORAL_TABLET | Freq: Two times a day (BID) | ORAL | 1 refills | Status: DC

## 2021-05-31 MED ORDER — SILDENAFIL CITRATE 100 MG PO TABS: 50.0000 mg | ORAL_TABLET | Freq: Every day | ORAL | 4 refills | Status: DC | PRN

## 2021-05-31 MED ORDER — CLONAZEPAM 2 MG PO TABS: ORAL_TABLET | ORAL | 2 refills | Status: DC

## 2021-05-31 NOTE — Assessment & Plan Note (Signed)
Well controlled with Amitriptyline 100 mg at bedtime. No changes in current management. Good sleep hygiene also encouraged.

## 2021-05-31 NOTE — Assessment & Plan Note (Addendum)
HgA1C improved but still abod goal. Not complaint with treatment, he was instructed to take Metformin 500 mg bid. Regular exercise and healthy diet with avoidance of added sugar food intake is an important part of treatment and recommended. Annual eye exam, periodic dental and foot care recommended. F/U in 5-6 months

## 2021-05-31 NOTE — Patient Instructions (Addendum)
A few things to remember from today's visit:   Type 2 diabetes mellitus with other specified complication, without long-term current use of insulin ([place]) - Plan: POC HgB H4L, Basic metabolic panel  Chronic pain disorder - Plan: traMADol (ULTRAM) 50 MG tablet  Colon cancer screening - Plan: Ambulatory referral to Gastroenterology  Erectile dysfunction, unspecified erectile dysfunction type - Plan: sildenafil (VIAGRA) 100 MG tablet  Osteoarthritis, unspecified osteoarthritis type, unspecified site - Plan: traMADol (ULTRAM) 50 MG tablet  Anxiety disorder, unspecified type - Plan: clonazePAM (KLONOPIN) 2 MG tablet  If you need refills please call your pharmacy. Do not use My Chart to request refills or for acute issues that need immediate attention.  Continue Tramadol and Clonazepam daily as needed. Take Metformin with supper and breakfast daily.   Please be sure medication list is accurate. If a new problem present, please set up appointment sooner than planned today.

## 2021-05-31 NOTE — Assessment & Plan Note (Addendum)
Side effects of Tramadol as well as current recommendations in regard to chronic use of opioids for pain management. [place] controlled substance reviewed. Med contract 11/2020.

## 2021-05-31 NOTE — Assessment & Plan Note (Signed)
Stable. Continue Clonazepam 2 mg at bedtime as needed.

## 2021-06-01 MED ORDER — TADALAFIL 20 MG PO TABS: 10.0000 mg | ORAL_TABLET | Freq: Every day | ORAL | 4 refills | Status: DC | PRN

## 2021-06-03 ENCOUNTER — Encounter: Payer: Self-pay | Admitting: Family Medicine

## 2021-06-04 ENCOUNTER — Encounter: Payer: Self-pay | Admitting: Family Medicine

## 2021-07-12 ENCOUNTER — Other Ambulatory Visit: Payer: Self-pay | Admitting: Family Medicine

## 2021-07-12 DIAGNOSIS — G4700 Insomnia, unspecified: Secondary | ICD-10-CM

## 2021-09-23 ENCOUNTER — Other Ambulatory Visit: Payer: Self-pay | Admitting: Family Medicine

## 2021-09-23 DIAGNOSIS — G894 Chronic pain syndrome: Secondary | ICD-10-CM

## 2021-09-23 DIAGNOSIS — M1990 Unspecified osteoarthritis, unspecified site: Secondary | ICD-10-CM

## 2021-11-05 ENCOUNTER — Other Ambulatory Visit: Payer: Self-pay | Admitting: Family Medicine

## 2021-11-05 DIAGNOSIS — G894 Chronic pain syndrome: Secondary | ICD-10-CM

## 2021-11-05 DIAGNOSIS — F419 Anxiety disorder, unspecified: Secondary | ICD-10-CM

## 2021-11-05 DIAGNOSIS — M1990 Unspecified osteoarthritis, unspecified site: Secondary | ICD-10-CM

## 2021-11-08 NOTE — Telephone Encounter (Signed)
Last visit 05/2021 ?Last refills 08/18/21 ?

## 2021-12-01 ENCOUNTER — Other Ambulatory Visit: Payer: Self-pay | Admitting: Family Medicine

## 2021-12-01 DIAGNOSIS — K219 Gastro-esophageal reflux disease without esophagitis: Secondary | ICD-10-CM

## 2021-12-30 ENCOUNTER — Other Ambulatory Visit: Payer: Self-pay | Admitting: Family Medicine

## 2021-12-30 DIAGNOSIS — G894 Chronic pain syndrome: Secondary | ICD-10-CM

## 2021-12-30 DIAGNOSIS — M1990 Unspecified osteoarthritis, unspecified site: Secondary | ICD-10-CM

## 2021-12-30 DIAGNOSIS — F419 Anxiety disorder, unspecified: Secondary | ICD-10-CM

## 2022-03-04 ENCOUNTER — Other Ambulatory Visit: Payer: Self-pay | Admitting: Family Medicine

## 2022-03-04 DIAGNOSIS — G4700 Insomnia, unspecified: Secondary | ICD-10-CM

## 2022-03-13 NOTE — Progress Notes (Unsigned)
HPI: Matthew [name] is a 56 y.o. male, who is here today for chronic disease management.  Last seen on 05/31/21 No new problems since his last visit.  Hyperlipidemia: Currently on Crestor 20 mg daily. Side effects from medication: None Lab Results  Component Value Date   CHOL 142 11/22/2020   HDL 53.00 11/22/2020   LDLCALC 55 11/22/2020   LDLDIRECT 60.0 06/25/2019   TRIG 172.0 (H) 11/22/2020   CHOLHDL 3 11/22/2020   Lab Results  Component Value Date   ALT 49 11/22/2020   AST 28 11/22/2020   ALKPHOS 58 11/22/2020   BILITOT 0.5 11/22/2020   Diabetes Mellitus II: Dx'ed about 8-9 years ago. - Checking BG at home: Metformin 500 mg bid, he misses it a few times per week. - Medications: Metformin 500 mg bid.  He has not been consistent with following dietary recommendations, reports his diet has "crappy." He works third shift, usually eats around 130 and 4 AM. He does not exercise regularly.  - eye exam:08/2020. - foot exam: Due - Negative for symptoms of hypoglycemia, polyuria, polydipsia, numbness extremities, foot ulcers/trauma.  Lab Results  Component Value Date   HGBA1C 7.3 (A) 05/31/2021   Lab Results  Component Value Date   MICROALBUR 0.7 11/24/2019   Chronic pain: Currently he is on tramadol 50 mg twice daily.  He ran out of medication, so has not taken it for a few days. Severe achy ankle pain, mainly left, status post surgery. IP for pain. Without medication pain level is 5-6/10 with med 0/10. Tramadol last filled on 11/08/21. He is also on meloxicam 15 mg daily as needed. Takes Zanaflex 4 mg daily as needed for lower back pain/muscle spasms.  Anxiety and insomnia: Currently he is on amitriptyline 100 mg at bedtime and clonazepam 2 mg daily as needed. Clonazepam was last filled 11/08/21. Medications also help with insomnia. Sleeping 6 hours. Feels rested.  ED med does not help. He has seen urology, doe snot want to follow. Cialis does not help. He  would like to try Sildenafil Negative for dysuria, gross hematuria, decreased urine output, genital lesions or anatomic abnormalities. He has history of hypogonadism.  Review of Systems  Constitutional:  Negative for activity change, appetite change and fever.  HENT:  Negative for nosebleeds and sore throat.   Eyes:  Negative for redness and visual disturbance.  Respiratory:  Negative for cough, shortness of breath and wheezing.   Cardiovascular:  Negative for chest pain, palpitations and leg swelling.  Gastrointestinal:  Negative for abdominal pain, nausea and vomiting.  Musculoskeletal:  Positive for arthralgias and back pain. Negative for gait problem.  Neurological:  Negative for syncope, weakness and headaches.  Psychiatric/Behavioral:  Positive for sleep disturbance. Negative for confusion and hallucinations. The patient is nervous/anxious.   Rest of ROS see pertinent positives and negatives in HPI.  Current Outpatient Medications on File Prior to Visit  Medication Sig Dispense Refill   amitriptyline (ELAVIL) 100 MG tablet TAKE 1 TABLET BY MOUTH EVERYDAY AT BEDTIME 90 tablet 1   aspirin 81 MG tablet Take 81 mg by mouth daily.     meloxicam (MOBIC) 15 MG tablet TAKE 1 TABLET BY MOUTH EVERY DAY 90 tablet 1   omeprazole (PRILOSEC) 20 MG capsule TAKE 1 CAPSULE BY MOUTH EVERY DAY 90 capsule 1   rosuvastatin (CRESTOR) 20 MG tablet TAKE 1 TABLET BY MOUTH EVERY DAY 90 tablet 2   No current facility-administered medications on file prior to visit.  Past Medical History:  Diagnosis Date   Anxiety    Depression    Diabetes mellitus    GERD (gastroesophageal reflux disease)    H/O hiatal hernia    ?   Mental disorder    OSA (obstructive sleep apnea)    Not wearing CPAP   Parotid mass    rt   Sleep apnea    just started on cpap 2 weeks   Stones in the urinary tract    No Known Allergies  Social History   Socioeconomic History   Marital status: Single    Spouse name: Not on  file   Number of children: Not on file   Years of education: Not on file   Highest education level: Not on file  Occupational History   Not on file  Tobacco Use   Smoking status: Former    Packs/day: 0.25    Years: 5.00    Total pack years: 1.25    Types: Cigarettes    Quit date: 05/28/2016    Years since quitting: 5.8   Smokeless tobacco: Former    Types: Chew   Tobacco comments:    marijuana 06/26/12  occ social drinker  Vaping Use   Vaping Use: Never used  Substance and Sexual Activity   Alcohol use: Yes    Comment: monthly   Drug use: Yes    Frequency: 1.0 times per week    Types: Marijuana    Comment: denies recent use   Sexual activity: Not on file  Other Topics Concern   Not on file  Social History Narrative   Not on file   Social Determinants of Health   Financial Resource Strain: Not on file  Food Insecurity: Not on file  Transportation Needs: Not on file  Physical Activity: Not on file  Stress: Not on file  Social Connections: Not on file   Vitals:   03/14/22 0725  BP: 128/80  Pulse: (!) 103  Resp: 12  SpO2: 98%   Wt Readings from Last 3 Encounters:  03/14/22 275 lb 6 oz (124.9 kg)  05/31/21 272 lb 2 oz (123.4 kg)  11/22/20 280 lb 4 oz (127.1 kg)   Body mass index is 38.41 kg/m.  Physical Exam Vitals and nursing note reviewed.  Constitutional:      General: He is not in acute distress.    Appearance: He is well-developed.  HENT:     Head: Normocephalic and atraumatic.     Mouth/Throat:     Mouth: Mucous membranes are moist.     Pharynx: Oropharynx is clear.  Eyes:     Conjunctiva/sclera: Conjunctivae normal.  Cardiovascular:     Rate and Rhythm: Regular rhythm. Tachycardia present.     Pulses:          Dorsalis pedis pulses are 2+ on the right side and 2+ on the left side.     Heart sounds: No murmur heard.    Comments: Trace pitting LE edema, bilateral. Pulmonary:     Effort: Pulmonary effort is normal. No respiratory distress.      Breath sounds: Normal breath sounds.  Abdominal:     Palpations: Abdomen is soft. There is no hepatomegaly or mass.     Tenderness: There is no abdominal tenderness.  Lymphadenopathy:     Cervical: No cervical adenopathy.  Skin:    General: Skin is warm.     Findings: No erythema or rash.  Neurological:     Mental Status: He is alert  and oriented to person, place, and time.     Cranial Nerves: No cranial nerve deficit.     Gait: Gait normal.    Diabetic Foot Exam - Simple   Simple Foot Form Diabetic Foot exam was performed with the following findings: Yes 03/14/2022  7:59 AM  Visual Inspection See comments: Yes Sensation Testing See comments: Yes Pulse Check Posterior Tibialis and Dorsalis pulse intact bilaterally: Yes Comments Decreased monofilament right great toe and a few toes left foot. Long toe nails.    ASSESSMENT AND PLAN:  Mr.Aquiles was seen today for follow-up.  Diagnoses and all orders for this visit: Orders Placed This Encounter  Procedures   Comprehensive metabolic panel   Lipid panel   DRUG MONITORING, PANEL 8 WITH CONFIRMATION, URINE   LDL cholesterol, direct   POC HgB A1c   Lab Results  Component Value Date   HGBA1C 8.5 (A) 03/14/2022   Lab Results  Component Value Date   CREATININE 1.03 03/14/2022   BUN 14 03/14/2022   NA 137 03/14/2022   K 4.1 03/14/2022   CL 102 03/14/2022   CO2 27 03/14/2022   Lab Results  Component Value Date   ALT 77 (H) 03/14/2022   AST 55 (H) 03/14/2022   ALKPHOS 64 03/14/2022   BILITOT 0.4 03/14/2022   Lab Results  Component Value Date   CHOL 186 03/14/2022   HDL 42.00 03/14/2022   LDLCALC 55 11/22/2020   LDLDIRECT 97.0 03/14/2022   TRIG 261.0 (H) 03/14/2022   CHOLHDL 4 03/14/2022   Insomnia, unspecified type Problem is stable. Continue Amitriptyline 100 mg at bedtime and good sleep hygiene.  High risk medication use -     DRUG MONITORING, PANEL 8 WITH CONFIRMATION, URINE; Future  Osteoarthritis,  unspecified osteoarthritis type, unspecified site Pain is well controlled. Continue Mobic 15 mg daily prn and Tramadol 50 mg bid prn. Some side effects of medications discussed.  -     traMADol (ULTRAM) 50 MG tablet; Take 1 tablet (50 mg total) by mouth every 12 (twelve) hours as needed.  Chronic bilateral low back pain without sciatica Mild and stable. Zanaflex helps with  muscle spasm.  -     tiZANidine (ZANAFLEX) 4 MG tablet; TAKE 1 TABLET BY MOUTH DAILY AS NEEDED FOR MUSCLE SPASMS.  Type 2 diabetes mellitus with other specified complication ([place]) DGL8V is not at goal, HgA1C went from 7.3 to 8.5. No changes in Metformin dose, stressed the importance of compliance. He agrees with adding Ozempic, starting 0.25 mg and titrating to 1 mg weekly as tolerated. Some side effects discussed. Annual eye exam, periodic dental and foot care recommended. F/U in 4 months  Hyperlipidemia associated with type 2 diabetes mellitus (HCC) Continue Rosuvastatin 20 mg daily and low fat diet. Further recommendations according to lipid panel result.  Anxiety disorder Problem has been stable. Continue clonazepam and Amitriptyline same dose.  Erectile dysfunction Cialis is not helping. He is not interested in following with urologist. He would like to try Sildenafil 100 mg.  Chronic pain disorder We discussed current guideline in regard to chronic pain management and opioid/opioid like treatments. Side effects discussed. Med contract signed today. PMP reviewed. Urine tox ordered today.  Morbid obesity ([place]) Patient understands the benefits of wt loss as well as adverse effects of obesity. Consistency with healthy diet and physical activity encouraged. Ozempic will also help.  I spent a total of 49 minutes in both face to face and non face to face activities for this  visit on the date of this encounter. During this time history was obtained and documented, examination was performed, reviewed, and  assessment/plan discussed.  Return in about 4 months (around 07/14/2022).  London Tarnowski G. Martinique, MD  [facility] [place] office.

## 2022-03-14 ENCOUNTER — Encounter: Payer: Self-pay | Admitting: Family Medicine

## 2022-03-14 ENCOUNTER — Ambulatory Visit (INDEPENDENT_AMBULATORY_CARE_PROVIDER_SITE_OTHER): Payer: 59 | Admitting: Family Medicine

## 2022-03-14 VITALS — BP 128/80 | HR 103 | Resp 12 | Ht 71.0 in | Wt 275.4 lb

## 2022-03-14 DIAGNOSIS — N529 Male erectile dysfunction, unspecified: Secondary | ICD-10-CM

## 2022-03-14 DIAGNOSIS — E785 Hyperlipidemia, unspecified: Secondary | ICD-10-CM | POA: Diagnosis not present

## 2022-03-14 DIAGNOSIS — G8929 Other chronic pain: Secondary | ICD-10-CM

## 2022-03-14 DIAGNOSIS — G894 Chronic pain syndrome: Secondary | ICD-10-CM

## 2022-03-14 DIAGNOSIS — M545 Low back pain, unspecified: Secondary | ICD-10-CM

## 2022-03-14 DIAGNOSIS — G4700 Insomnia, unspecified: Secondary | ICD-10-CM | POA: Diagnosis not present

## 2022-03-14 DIAGNOSIS — M1990 Unspecified osteoarthritis, unspecified site: Secondary | ICD-10-CM

## 2022-03-14 DIAGNOSIS — E1169 Type 2 diabetes mellitus with other specified complication: Secondary | ICD-10-CM | POA: Diagnosis not present

## 2022-03-14 DIAGNOSIS — Z79899 Other long term (current) drug therapy: Secondary | ICD-10-CM

## 2022-03-14 DIAGNOSIS — F419 Anxiety disorder, unspecified: Secondary | ICD-10-CM

## 2022-03-14 DIAGNOSIS — R7401 Elevation of levels of liver transaminase levels: Secondary | ICD-10-CM

## 2022-03-14 LAB — LIPID PANEL
Cholesterol: 186 mg/dL (ref 0–200)
HDL: 42 mg/dL (ref >39.00)
NonHDL: 143.52
Total CHOL/HDL Ratio: 4
Triglycerides: 261 mg/dL — ABNORMAL HIGH (ref 0.0–149.0)
VLDL: 52.2 mg/dL — ABNORMAL HIGH (ref 0.0–40.0)

## 2022-03-14 LAB — LDL CHOLESTEROL, DIRECT: Direct LDL: 97 mg/dL

## 2022-03-14 LAB — COMPREHENSIVE METABOLIC PANEL
ALT: 77 U/L — ABNORMAL HIGH (ref 0–53)
AST: 55 U/L — ABNORMAL HIGH (ref 0–37)
Albumin: 4.6 g/dL (ref 3.5–5.2)
Alkaline Phosphatase: 64 U/L (ref 39–117)
BUN: 14 mg/dL (ref 6–23)
CO2: 27 mEq/L (ref 19–32)
Calcium: 9.5 mg/dL (ref 8.4–10.5)
Chloride: 102 mEq/L (ref 96–112)
Creatinine, Ser: 1.03 mg/dL (ref 0.40–1.50)
GFR: 80.45 mL/min (ref >60.00)
Glucose, Bld: 201 mg/dL — ABNORMAL HIGH (ref 70–99)
Potassium: 4.1 mEq/L (ref 3.5–5.1)
Sodium: 137 mEq/L (ref 135–145)
Total Bilirubin: 0.4 mg/dL (ref 0.2–1.2)
Total Protein: 7.6 g/dL (ref 6.0–8.3)

## 2022-03-14 LAB — POCT GLYCOSYLATED HEMOGLOBIN (HGB A1C): Hemoglobin A1C: 8.5 % — AB (ref 4.0–5.6)

## 2022-03-14 MED ORDER — TRAMADOL HCL 50 MG PO TABS: 50.0000 mg | ORAL_TABLET | Freq: Two times a day (BID) | ORAL | 3 refills | Status: DC | PRN

## 2022-03-14 MED ORDER — METFORMIN HCL 500 MG PO TABS
500.0000 mg | ORAL_TABLET | Freq: Two times a day (BID) | ORAL | 1 refills | Status: DC
Start: 2022-03-14 — End: 2022-09-22

## 2022-03-14 MED ORDER — SILDENAFIL CITRATE 100 MG PO TABS: 100.0000 mg | ORAL_TABLET | Freq: Every day | ORAL | 2 refills | Status: DC | PRN

## 2022-03-14 MED ORDER — TIZANIDINE HCL 4 MG PO TABS: ORAL_TABLET | ORAL | 1 refills | Status: DC

## 2022-03-14 MED ORDER — OZEMPIC (0.25 OR 0.5 MG/DOSE) 2 MG/3ML ~~LOC~~ SOPN: PEN_INJECTOR | SUBCUTANEOUS | 2 refills | Status: DC

## 2022-03-14 MED ORDER — CLONAZEPAM 2 MG PO TABS: 2.0000 mg | ORAL_TABLET | Freq: Every evening | ORAL | 3 refills | Status: DC | PRN

## 2022-03-14 NOTE — Assessment & Plan Note (Signed)
Problem has been stable. Continue clonazepam and Amitriptyline same dose.

## 2022-03-14 NOTE — Assessment & Plan Note (Addendum)
HgA1C is not at goal, HgA1C went from 7.3 to 8.5. No changes in Metformin dose, stressed the importance of compliance. He agrees with adding Ozempic, starting 0.25 mg and titrating to 1 mg weekly as tolerated. Some side effects discussed. Annual eye exam, periodic dental and foot care recommended. F/U in 4 months

## 2022-03-14 NOTE — Assessment & Plan Note (Signed)
Patient understands the benefits of wt loss as well as adverse effects of obesity. Consistency with healthy diet and physical activity encouraged. Ozempic will also help.

## 2022-03-14 NOTE — Assessment & Plan Note (Signed)
Cialis is not helping. He is not interested in following with urologist. He would like to try Sildenafil 100 mg.

## 2022-03-14 NOTE — Assessment & Plan Note (Signed)
Continue Rosuvastatin 20 mg daily and low fat diet. Further recommendations according to lipid panel result.

## 2022-03-14 NOTE — Patient Instructions (Addendum)
A few things to remember from today's visit:   Type 2 diabetes mellitus with other specified complication, without long-term current use of insulin ([place]) - Plan: POC HgB A1c, Microalbumin / creatinine urine ratio, Comprehensive metabolic panel, TXMIWOEHOZY,2.48 or 0.'5MG'$ /DOS, (OZEMPIC, 0.25 OR 0.5 MG/DOSE,) 2 MG/3ML SOPN  Chronic pain disorder - Plan: DRUG MONITORING, PANEL 8 WITH CONFIRMATION, URINE  Insomnia, unspecified type  Hyperlipidemia associated with type 2 diabetes mellitus ([place]) - Plan: Comprehensive metabolic panel, Lipid panel  Erectile dysfunction, unspecified erectile dysfunction type  High risk medication use  Anxiety disorder, unspecified type  If you need refills please call your pharmacy. Do not use My Chart to request refills or for acute issues that need immediate attention.   Today Ozempic added, start 0.25 mg weekly and double dose every 3 weeks to a goal of 1 mg weekly. No changes in Metformin or rest of medications. You are due for your eye exam.  Please be sure medication list is accurate. If a new problem present, please set up appointment sooner than planned today.

## 2022-03-14 NOTE — Assessment & Plan Note (Addendum)
We discussed current guideline in regard to chronic pain management and opioid/opioid like treatments. Side effects discussed. Med contract signed today. PMP reviewed. Urine tox ordered today.

## 2022-03-16 ENCOUNTER — Encounter: Payer: Self-pay | Admitting: Family Medicine

## 2022-03-16 DIAGNOSIS — N529 Male erectile dysfunction, unspecified: Secondary | ICD-10-CM

## 2022-03-16 MED ORDER — ROSUVASTATIN CALCIUM 20 MG PO TABS
20.0000 mg | ORAL_TABLET | Freq: Every day | ORAL | 2 refills | Status: DC
Start: 2022-03-16 — End: 2022-11-24

## 2022-03-17 LAB — DRUG MONITORING, PANEL 8 WITH CONFIRMATION, URINE
6 Acetylmorphine: NEGATIVE ng/mL (ref <10)
Alcohol Metabolites: POSITIVE ng/mL — AB (ref <500)
Amphetamines: NEGATIVE ng/mL (ref <500)
Benzodiazepines: NEGATIVE ng/mL (ref <100)
Buprenorphine, Urine: NEGATIVE ng/mL (ref <5)
Cocaine Metabolite: NEGATIVE ng/mL (ref <150)
Creatinine: 68.1 mg/dL (ref >=20.0)
Ethyl Glucuronide (ETG): 9664 ng/mL — ABNORMAL HIGH (ref <500)
Ethyl Sulfate (ETS): 3560 ng/mL — ABNORMAL HIGH (ref <100)
MDMA: NEGATIVE ng/mL (ref <500)
Marijuana Metabolite: 248 ng/mL — ABNORMAL HIGH (ref <5)
Marijuana Metabolite: POSITIVE ng/mL — AB (ref <20)
Opiates: NEGATIVE ng/mL (ref <100)
Oxidant: NEGATIVE ug/mL (ref <200)
Oxycodone: NEGATIVE ng/mL (ref <100)
pH: 5.3 (ref 4.5–9.0)

## 2022-03-17 LAB — DM TEMPLATE

## 2022-03-18 ENCOUNTER — Other Ambulatory Visit: Payer: Self-pay | Admitting: Family Medicine

## 2022-03-18 DIAGNOSIS — M1990 Unspecified osteoarthritis, unspecified site: Secondary | ICD-10-CM

## 2022-03-18 DIAGNOSIS — G894 Chronic pain syndrome: Secondary | ICD-10-CM

## 2022-03-22 MED ORDER — SILDENAFIL CITRATE 100 MG PO TABS: 100.0000 mg | ORAL_TABLET | Freq: Every day | ORAL | 2 refills | Status: DC | PRN

## 2022-05-29 ENCOUNTER — Other Ambulatory Visit: Payer: Self-pay

## 2022-05-29 ENCOUNTER — Encounter (HOSPITAL_BASED_OUTPATIENT_CLINIC_OR_DEPARTMENT_OTHER): Payer: Self-pay

## 2022-05-29 ENCOUNTER — Emergency Department (HOSPITAL_BASED_OUTPATIENT_CLINIC_OR_DEPARTMENT_OTHER)
Admission: EM | Admit: 2022-05-29 | Discharge: 2022-05-29 | Disposition: A | Discharge status: Home / Self Care | Payer: 59 | Attending: Emergency Medicine | Admitting: Emergency Medicine

## 2022-05-29 ENCOUNTER — Encounter: Payer: Self-pay | Admitting: Family Medicine

## 2022-05-29 DIAGNOSIS — E1165 Type 2 diabetes mellitus with hyperglycemia: Secondary | ICD-10-CM | POA: Insufficient documentation

## 2022-05-29 DIAGNOSIS — I10 Essential (primary) hypertension: Secondary | ICD-10-CM | POA: Insufficient documentation

## 2022-05-29 DIAGNOSIS — Z7982 Long term (current) use of aspirin: Secondary | ICD-10-CM | POA: Diagnosis not present

## 2022-05-29 DIAGNOSIS — Z7984 Long term (current) use of oral hypoglycemic drugs: Secondary | ICD-10-CM | POA: Diagnosis not present

## 2022-05-29 DIAGNOSIS — R739 Hyperglycemia, unspecified: Secondary | ICD-10-CM

## 2022-05-29 DIAGNOSIS — E1369 Other specified diabetes mellitus with other specified complication: Secondary | ICD-10-CM | POA: Insufficient documentation

## 2022-05-29 LAB — COMPREHENSIVE METABOLIC PANEL
ALT: 46 U/L — ABNORMAL HIGH (ref 0–44)
AST: 26 U/L (ref 15–41)
Albumin: 4.4 g/dL (ref 3.5–5.0)
Alkaline Phosphatase: 83 U/L (ref 38–126)
Anion gap: 13 (ref 5–15)
BUN: 16 mg/dL (ref 6–20)
CO2: 21 mmol/L — ABNORMAL LOW (ref 22–32)
Calcium: 9.7 mg/dL (ref 8.9–10.3)
Chloride: 95 mmol/L — ABNORMAL LOW (ref 98–111)
Creatinine, Ser: 0.91 mg/dL (ref 0.61–1.24)
GFR, Estimated: >60 mL/min (ref >60)
Glucose, Bld: 463 mg/dL — ABNORMAL HIGH (ref 70–99)
Potassium: 4.4 mmol/L (ref 3.5–5.1)
Sodium: 129 mmol/L — ABNORMAL LOW (ref 135–145)
Total Bilirubin: 0.5 mg/dL (ref 0.3–1.2)
Total Protein: 7.5 g/dL (ref 6.5–8.1)

## 2022-05-29 LAB — CBC WITH DIFFERENTIAL/PLATELET
Abs Immature Granulocytes: 0.02 10*3/uL (ref 0.00–0.07)
Basophils Absolute: 0.1 10*3/uL (ref 0.0–0.1)
Basophils Relative: 1 %
Eosinophils Absolute: 0.2 10*3/uL (ref 0.0–0.5)
Eosinophils Relative: 2 %
HCT: 47.8 % (ref 39.0–52.0)
Hemoglobin: 17.1 g/dL — ABNORMAL HIGH (ref 13.0–17.0)
Immature Granulocytes: 0 %
Lymphocytes Relative: 46 %
Lymphs Abs: 4.8 10*3/uL — ABNORMAL HIGH (ref 0.7–4.0)
MCH: 32 pg (ref 26.0–34.0)
MCHC: 35.8 g/dL (ref 30.0–36.0)
MCV: 89.3 fL (ref 80.0–100.0)
Monocytes Absolute: 0.6 10*3/uL (ref 0.1–1.0)
Monocytes Relative: 6 %
Neutro Abs: 4.7 10*3/uL (ref 1.7–7.7)
Neutrophils Relative %: 45 %
Platelets: 292 10*3/uL (ref 150–400)
RBC: 5.35 MIL/uL (ref 4.22–5.81)
RDW: 12.7 % (ref 11.5–15.5)
WBC: 10.3 10*3/uL (ref 4.0–10.5)
nRBC: 0 % (ref 0.0–0.2)

## 2022-05-29 LAB — URINALYSIS, ROUTINE W REFLEX MICROSCOPIC
Bilirubin Urine: NEGATIVE
Glucose, UA: >1000 mg/dL — AB
Hgb urine dipstick: NEGATIVE
Ketones, ur: 40 mg/dL — AB
Leukocytes,Ua: NEGATIVE
Nitrite: NEGATIVE
Protein, ur: NEGATIVE mg/dL
Specific Gravity, Urine: 1.033 — ABNORMAL HIGH (ref 1.005–1.030)
pH: 5 (ref 5.0–8.0)

## 2022-05-29 LAB — CBG MONITORING, ED
Glucose-Capillary: 444 mg/dL — ABNORMAL HIGH (ref 70–99)
Glucose-Capillary: 487 mg/dL — ABNORMAL HIGH (ref 70–99)
Glucose-Capillary: 383 mg/dL — ABNORMAL HIGH (ref 70–99)
Glucose-Capillary: 333 mg/dL — ABNORMAL HIGH (ref 70–99)

## 2022-05-29 MED ORDER — INSULIN REGULAR HUMAN 100 UNIT/ML IJ SOLN
5.0000 [IU] | Freq: Once | INTRAMUSCULAR | Status: DC
  Filled 2022-05-29: qty 3

## 2022-05-29 MED ORDER — SODIUM CHLORIDE 0.9 % IV BOLUS
1000.0000 mL | Freq: Once | INTRAVENOUS | Status: AC
  Administered 2022-05-29: 1000 mL via INTRAVENOUS

## 2022-05-29 MED ORDER — INSULIN ASPART 100 UNIT/ML IJ SOLN
10.0000 [IU] | Freq: Once | INTRAMUSCULAR | Status: AC
  Administered 2022-05-29: 10 [IU] via SUBCUTANEOUS

## 2022-05-29 MED ORDER — INSULIN ASPART PROT & ASPART (70-30 MIX) 100 UNIT/ML ~~LOC~~ SUSP: 5.0000 [IU] | Freq: Once | SUBCUTANEOUS | Status: DC

## 2022-05-29 MED ORDER — INSULIN ASPART 100 UNIT/ML IJ SOLN
5.0000 [IU] | Freq: Once | INTRAMUSCULAR | Status: AC
  Administered 2022-05-29: 5 [IU] via SUBCUTANEOUS

## 2022-05-29 NOTE — Discharge Instructions (Addendum)
Please return to ED for any worsening symptoms.  Please follow up with your primary care physician in 7 days for blood sugar recheck.   Start taking your Ozempic injections :)  Stay hydrated.

## 2022-05-29 NOTE — ED Provider Notes (Signed)
[facility] EMERGENCY DEPT Provider Note   CSN: [id number] Arrival date & time: 05/29/22  1151     History {Add pertinent medical, surgical, social history, OB history to HPI:1} Chief Complaint  Patient presents with   Hyperglycemia    HAWARD POPE is a 56 y.o. male.  Patient is a 56 year old male with past medical history of OSA, diabetes, hypertension, hyperlipidemia, diabetes, and anxiety presenting for increased urinary frequency and elevated blood sugars.  Patient currently on metformin.  He was recently prescribed and recommended to start taking Semaglutide,0.25 or 0.'5MG'$ /DOS, (OZEMPIC, 0.25 OR 0.5 MG/DOSE,) 2 MG/3ML SOPN by his primary care physician however has not started taking them yet.  Blood sugar on arrival 463.  Patient otherwise denies any fevers, chills, illness, coughing, vomiting, or changes in mental status.  The history is provided by the patient. No language interpreter was used.  Hyperglycemia Associated symptoms: no abdominal pain, no chest pain, no dysuria, no fever, no shortness of breath and no vomiting        Home Medications Prior to Admission medications   Medication Sig Start Date End Date Taking? Authorizing Provider  amitriptyline (ELAVIL) 100 MG tablet TAKE 1 TABLET BY MOUTH EVERYDAY AT BEDTIME 03/06/22   Martinique, Betty G, MD  aspirin 81 MG tablet Take 81 mg by mouth daily.    [provider]  clonazePAM (KLONOPIN) 2 MG tablet Take 1 tablet (2 mg total) by mouth at bedtime as needed for anxiety. 03/14/22   Martinique, Betty G, MD  meloxicam (MOBIC) 15 MG tablet TAKE 1 TABLET BY MOUTH EVERY DAY 03/20/22   Martinique, Betty G, MD  metFORMIN (GLUCOPHAGE) 500 MG tablet Take 1 tablet (500 mg total) by mouth 2 (two) times daily with a meal. 03/14/22   Martinique, Betty G, MD  omeprazole (PRILOSEC) 20 MG capsule TAKE 1 CAPSULE BY MOUTH EVERY DAY 12/02/21   Martinique, Betty G, MD  rosuvastatin (CRESTOR) 20 MG tablet Take 1 tablet (20 mg total) by mouth  daily. 03/16/22   Martinique, Betty G, MD  Semaglutide,0.25 or 0.'5MG'$ /DOS, (OZEMPIC, 0.25 OR 0.5 MG/DOSE,) 2 MG/3ML SOPN 0.25 mg weekly for 3 weeks then 0.5 mg weekly for 3 weeks then 1 mg weekly for 3 weeks. 03/14/22   Martinique, Betty G, MD  sildenafil (VIAGRA) 100 MG tablet Take 1 tablet (100 mg total) by mouth daily as needed for erectile dysfunction. 03/22/22   Martinique, Betty G, MD  tiZANidine (ZANAFLEX) 4 MG tablet TAKE 1 TABLET BY MOUTH DAILY AS NEEDED FOR MUSCLE SPASMS. 03/14/22   Martinique, Betty G, MD  traMADol (ULTRAM) 50 MG tablet Take 1 tablet (50 mg total) by mouth every 12 (twelve) hours as needed. 03/14/22   Martinique, Betty G, MD      Allergies    Patient has no known allergies.    Review of Systems   Review of Systems  Constitutional:  Negative for chills and fever.  HENT:  Negative for ear pain and sore throat.   Eyes:  Negative for pain and visual disturbance.  Respiratory:  Negative for cough and shortness of breath.   Cardiovascular:  Negative for chest pain and palpitations.  Gastrointestinal:  Negative for abdominal pain and vomiting.  Genitourinary:  Positive for frequency. Negative for dysuria and hematuria.  Musculoskeletal:  Negative for arthralgias and back pain.  Skin:  Negative for color change and rash.  Neurological:  Negative for seizures and syncope.  All other systems reviewed and are negative.   Physical Exam  Updated Vital Signs BP (!) 136/94 (BP Location: Right Arm)   Pulse (!) 102   Temp 98.3 F (36.8 C) (Oral)   Resp 15   Ht '5\' 11"'$  (1.803 m)   Wt 122.5 kg   SpO2 97%   BMI 37.66 kg/m  Physical Exam Vitals and nursing note reviewed.  Constitutional:      General: He is not in acute distress.    Appearance: He is well-developed.  HENT:     Head: Normocephalic and atraumatic.  Eyes:     Conjunctiva/sclera: Conjunctivae normal.  Cardiovascular:     Rate and Rhythm: Normal rate and regular rhythm.     Heart sounds: No murmur heard. Pulmonary:     Effort:  Pulmonary effort is normal. No respiratory distress.     Breath sounds: Normal breath sounds.  Abdominal:     Palpations: Abdomen is soft.     Tenderness: There is no abdominal tenderness.  Musculoskeletal:        General: No swelling.     Cervical back: Neck supple.  Skin:    General: Skin is warm and dry.     Capillary Refill: Capillary refill takes less than 2 seconds.  Neurological:     Mental Status: He is alert.  Psychiatric:        Mood and Affect: Mood normal.     ED Results / Procedures / Treatments   Labs (all labs ordered are listed, but only abnormal results are displayed) Labs Reviewed  CBC WITH DIFFERENTIAL/PLATELET - Abnormal; Notable for the following components:      Result Value   Hemoglobin 17.1 (*)    Lymphs Abs 4.8 (*)    All other components within normal limits  COMPREHENSIVE METABOLIC PANEL - Abnormal; Notable for the following components:   Sodium 129 (*)    Chloride 95 (*)    CO2 21 (*)    Glucose, Bld 463 (*)    ALT 46 (*)    All other components within normal limits  URINALYSIS, ROUTINE W REFLEX MICROSCOPIC - Abnormal; Notable for the following components:   Color, Urine COLORLESS (*)    Specific Gravity, Urine 1.033 (*)    Glucose, UA >1,000 (*)    Ketones, ur 40 (*)    All other components within normal limits  CBG MONITORING, ED - Abnormal; Notable for the following components:   Glucose-Capillary 444 (*)    All other components within normal limits  CBG MONITORING, ED - Abnormal; Notable for the following components:   Glucose-Capillary 487 (*)    All other components within normal limits    EKG None  Radiology No results found.  Procedures Procedures  {Document cardiac monitor, telemetry assessment procedure when appropriate:1}  Medications Ordered in ED Medications  sodium chloride 0.9 % bolus 1,000 mL (has no administration in time range)  insulin aspart (novoLOG) injection 10 Units (has no administration in time range)     ED Course/ Medical Decision Making/ A&P                           Medical Decision Making Amount and/or Complexity of Data Reviewed Labs: ordered.  Risk Prescription drug management.   87:81 PM 56 year old male with past medical history of OSA, diabetes, hypertension, hyperlipidemia, diabetes, and anxiety presenting for increased urinary frequency and elevated blood sugars.    {Document critical care time when appropriate:1} {Document review of labs and clinical decision tools ie heart score,  Chads2Vasc2 etc:1}  {Document your independent review of radiology images, and any outside records:1} {Document your discussion with family members, caretakers, and with consultants:1} {Document social determinants of health affecting pt's care:1} {Document your decision making why or why not admission, treatments were needed:1} Final Clinical Impression(s) / ED Diagnoses Final diagnoses:  None    Rx / DC Orders ED Discharge Orders     None

## 2022-05-29 NOTE — ED Triage Notes (Signed)
Urinary frequency and blurry vision States his blood sugars have been running high, on Metformin and has not started his injectable diabetic med Last blood sugar check 2 days ago in the 400s BS in triage 444

## 2022-05-30 ENCOUNTER — Other Ambulatory Visit: Payer: Self-pay | Admitting: Family Medicine

## 2022-05-30 DIAGNOSIS — K219 Gastro-esophageal reflux disease without esophagitis: Secondary | ICD-10-CM

## 2022-05-30 MED ORDER — ONETOUCH ULTRA 2 W/DEVICE KIT: PACK | 0 refills | Status: DC

## 2022-05-30 MED ORDER — ONETOUCH ULTRASOFT LANCETS MISC: 12 refills | Status: DC

## 2022-05-30 MED ORDER — ONETOUCH ULTRA VI STRP: ORAL_STRIP | 12 refills | Status: AC

## 2022-05-31 ENCOUNTER — Telehealth: Payer: Self-pay

## 2022-05-31 ENCOUNTER — Other Ambulatory Visit: Payer: Self-pay

## 2022-05-31 MED ORDER — ONETOUCH ULTRASOFT 2 LANCETS MISC: 1.0000 | Freq: Every day | 3 refills | Status: AC | PRN

## 2022-05-31 NOTE — Telephone Encounter (Signed)
Transition Care Management Follow-up Telephone Call Date of discharge and from where: 05/29/22 from [facility] for "hyperglycemia" How have you been since you were released from the hospital? H/a has gone away. Decreased urinary frequency.  Any questions or concerns? No  Items Reviewed: Did the pt receive and understand the discharge instructions provided? Yes  Medications obtained and verified? Yes  Other? No  Any new allergies since your discharge? No  Dietary orders reviewed? Yes Do you have support at home? Yes    Follow up appointments reviewed:  PCP Hospital f/u appt confirmed? Yes  Scheduled to see Dr Martinique on 06/06/22 @ 7:30am. Are transportation arrangements needed? No  If their condition worsens, is the pt aware to call PCP or go to the Emergency Dept.? Yes Was the patient provided with contact information for the PCP's office or ED? Yes Was to pt encouraged to call back with questions or concerns? Yes

## 2022-06-05 NOTE — Progress Notes (Signed)
HPI: [name] is a 56 y.o. male, who is here today with his mother to follow on recent ED visit. Evaluated in the ED on 05/29/2022 because elevated BS and urinary frequency. Glucose was 463. He was treated with NovoLog 10 units initially then 5 units [name] Taking Metformin 500 mg bid, higher doses caused GI side effects. He has started Ozempic 0.5 mg and has administered one shot so far, no side effects reported.  Since the ED visit, He has not checked his blood sugar due to a lack of test strips, his pharmacy already called him to let him know the strips are ready to be picked up.  He has experienced nausea for approximately 4-5 days before going to the ED, but no abdominal pain or vomiting. He has been working on watching his carbohydrate intake and trying to consume less sugar in his diet for the past few days.  He reports a weight loss of 20 pounds since his last visit.  He reports that his blood sugar was 503 ,checked by friend with his home blood glucose meter a few days before the ED visit.  + Polydipsia and polyuria. Since ED visit he has been abstaining from sugary drinks and consuming water instead.   BP during ED visit was elevated at 145/96.No hx of HTN. Negative for severe/frequent headache, visual changes, chest pain, dyspnea, palpitation, focal weakness, or edema.  Lab Results  Component Value Date   CREATININE 0.91 05/29/2022   BUN 16 05/29/2022   NA 129 (L) 05/29/2022   K 4.4 05/29/2022   CL 95 (L) 05/29/2022   CO2 21 (L) 05/29/2022   Lab Results  Component Value Date   WBC 10.3 05/29/2022   HGB 17.1 (H) 05/29/2022   HCT 47.8 05/29/2022   MCV 89.3 05/29/2022   PLT 292 05/29/2022   Lab Results  Component Value Date   HGBA1C 8.5 (A) 03/14/2022   Review of Systems  Constitutional:  Negative for activity change, appetite change and fever.  HENT:  Negative for sore throat and trouble swallowing.   Respiratory:  Negative for cough and wheezing.    Gastrointestinal:  Positive for nausea. Negative for abdominal pain and vomiting.  Genitourinary:  Negative for decreased urine volume, dysuria and hematuria.  Skin:  Negative for rash.  Neurological:  Negative for syncope and facial asymmetry.  Rest see pertinent positives and negatives per HPI.  Current Outpatient Medications on File Prior to Visit  Medication Sig Dispense Refill   amitriptyline (ELAVIL) 100 MG tablet TAKE 1 TABLET BY MOUTH EVERYDAY AT BEDTIME 90 tablet 1   aspirin 81 MG tablet Take 81 mg by mouth daily.     Blood Glucose Monitoring Suppl (ONE TOUCH ULTRA 2) w/Device KIT Use to check blood sugars 1-2 times daily. 1 kit 0   clonazePAM (KLONOPIN) 2 MG tablet Take 1 tablet (2 mg total) by mouth at bedtime as needed for anxiety. 30 tablet 3   glucose blood (ONETOUCH ULTRA) test strip Use to check blood sugars 1-2 times daily. 100 each 12   meloxicam (MOBIC) 15 MG tablet TAKE 1 TABLET BY MOUTH EVERY DAY 90 tablet 1   metFORMIN (GLUCOPHAGE) 500 MG tablet Take 1 tablet (500 mg total) by mouth 2 (two) times daily with a meal. 180 tablet 1   omeprazole (PRILOSEC) 20 MG capsule TAKE 1 CAPSULE BY MOUTH EVERY DAY 90 capsule 1   OneTouch UltraSoft 2 Lancets MISC 1 each by Does not apply route daily as needed.  Use to check blood sugars 1-2 times daily. 100 each 3   rosuvastatin (CRESTOR) 20 MG tablet Take 1 tablet (20 mg total) by mouth daily. 90 tablet 2   Semaglutide,0.25 or 0.5MG/DOS, (OZEMPIC, 0.25 OR 0.5 MG/DOSE,) 2 MG/3ML SOPN 0.25 mg weekly for 3 weeks then 0.5 mg weekly for 3 weeks then 1 mg weekly for 3 weeks. 6 mL 2   sildenafil (VIAGRA) 100 MG tablet Take 1 tablet (100 mg total) by mouth daily as needed for erectile dysfunction. 20 tablet 2   tiZANidine (ZANAFLEX) 4 MG tablet TAKE 1 TABLET BY MOUTH DAILY AS NEEDED FOR MUSCLE SPASMS. 90 tablet 1   traMADol (ULTRAM) 50 MG tablet Take 1 tablet (50 mg total) by mouth every 12 (twelve) hours as needed. 60 tablet 3   No current  facility-administered medications on file prior to visit.   Past Medical History:  Diagnosis Date   Anxiety    Depression    Diabetes mellitus    GERD (gastroesophageal reflux disease)    H/O hiatal hernia    ?   Mental disorder    OSA (obstructive sleep apnea)    Not wearing CPAP   Parotid mass    rt   Sleep apnea    just started on cpap 2 weeks   Stones in the urinary tract    No Known Allergies  Social History   Socioeconomic History   Marital status: Single    Spouse name: Not on file   Number of children: Not on file   Years of education: Not on file   Highest education level: Not on file  Occupational History   Not on file  Tobacco Use   Smoking status: Former    Packs/day: 0.25    Years: 5.00    Total pack years: 1.25    Types: Cigarettes    Quit date: 05/28/2016    Years since quitting: 6.0   Smokeless tobacco: Former    Types: Chew   Tobacco comments:    marijuana 06/26/12  occ social drinker  Vaping Use   Vaping Use: Never used  Substance and Sexual Activity   Alcohol use: Yes    Comment: monthly   Drug use: Yes    Frequency: 1.0 times per week    Types: Marijuana    Comment: denies recent use   Sexual activity: Not on file  Other Topics Concern   Not on file  Social History Narrative   Not on file   Social Determinants of Health   Financial Resource Strain: Not on file  Food Insecurity: Not on file  Transportation Needs: Not on file  Physical Activity: Not on file  Stress: Not on file  Social Connections: Not on file   Vitals:   06/06/22 0730  BP: 120/74  Pulse: 98  Resp: 12  Temp: 98.6 F (37 C)  SpO2: 95%   Wt Readings from Last 3 Encounters:  06/06/22 256 lb 2 oz (116.2 kg)  05/29/22 270 lb (122.5 kg)  03/14/22 275 lb 6 oz (124.9 kg)   Body mass index is 35.72 kg/m.  Physical Exam Vitals and nursing note reviewed.  Constitutional:      General: He is not in acute distress.    Appearance: He is well-developed.   HENT:     Head: Normocephalic and atraumatic.     Mouth/Throat:     Mouth: Mucous membranes are dry.  Eyes:     Conjunctiva/sclera: Conjunctivae normal.  Cardiovascular:  Rate and Rhythm: Normal rate and regular rhythm.     Pulses:          Dorsalis pedis pulses are 2+ on the right side and 2+ on the left side.     Heart sounds: No murmur heard. Pulmonary:     Effort: Pulmonary effort is normal. No respiratory distress.     Breath sounds: Normal breath sounds.  Abdominal:     Palpations: Abdomen is soft. There is no hepatomegaly or mass.     Tenderness: There is no abdominal tenderness.  Lymphadenopathy:     Cervical: No cervical adenopathy.  Skin:    General: Skin is warm.     Findings: No erythema or rash.  Neurological:     Mental Status: He is alert and oriented to person, place, and time.     Cranial Nerves: No cranial nerve deficit.     Gait: Gait normal.  Psychiatric:        Mood and Affect: Mood and affect normal.   ASSESSMENT AND PLAN:  Mr.Emet was seen today for hospitalization follow-up.  Diagnoses and all orders for this visit: Orders Placed This Encounter  Procedures   Flu Vaccine QUAD 48moIM (Fluarix, Fluzone & Alfiuria Quad PF)   Amb Referral to Nutrition and Diabetic Education   POC Glucose (CBG)   Fingerstick today 309.  Type 2 diabetes mellitus with other specified complication ([place] Problem is not well controlled. Basaglar 10 U started today, can increased to 13 U in a week if BS's > 200. Continue Ozempic but 0.25 mg x 3 wks,0.5 mg x 3 wks,and then 1 mg weekly. No changes in Metformin dose. Start monitoring BS's. Nutrition referral placed. We discussed possible complications of elevated glucose. Regular exercise and healthy diet with avoidance of added sugar food intake is an important part of treatment and encouraged. Annual eye exam, periodic dental and foot care recommended. F/U in 2 months.  Morbid obesity ([place] Has lost about 19-20  Lb sine his alst visit in 03/2022, most likely resulted from hyperglycemia. Consistency with healthy diet and physical activity encouraged.  Elevated blood pressure reading Mildly elevated during his recent ED visit. Today BP adequate. We will continue monitoring during OV's.  Need for influenza vaccination -     Flu Vaccine QUAD 616moM (Fluarix, Fluzone & Alfiuria Quad PF)  Return in about 8 weeks (around 08/02/2022).  Nashton Belson G. JoMartiniqueMD  [facility].

## 2022-06-06 ENCOUNTER — Encounter: Payer: Self-pay | Admitting: Family Medicine

## 2022-06-06 ENCOUNTER — Ambulatory Visit (INDEPENDENT_AMBULATORY_CARE_PROVIDER_SITE_OTHER): Payer: 59 | Admitting: Family Medicine

## 2022-06-06 VITALS — BP 120/74 | HR 98 | Temp 98.6°F | Resp 12 | Ht 71.0 in | Wt 256.1 lb

## 2022-06-06 DIAGNOSIS — R030 Elevated blood-pressure reading, without diagnosis of hypertension: Secondary | ICD-10-CM | POA: Diagnosis not present

## 2022-06-06 DIAGNOSIS — E1169 Type 2 diabetes mellitus with other specified complication: Secondary | ICD-10-CM | POA: Diagnosis not present

## 2022-06-06 DIAGNOSIS — Z23 Encounter for immunization: Secondary | ICD-10-CM

## 2022-06-06 LAB — GLUCOSE, POCT (MANUAL RESULT ENTRY): POC Glucose: 309 mg/dl — AB (ref 70–99)

## 2022-06-06 MED ORDER — INSULIN PEN NEEDLE 32G X 4 MM MISC: 3 refills | Status: AC

## 2022-06-06 MED ORDER — BASAGLAR KWIKPEN 100 UNIT/ML ~~LOC~~ SOPN: 10.0000 [IU] | PEN_INJECTOR | Freq: Every day | SUBCUTANEOUS | 1 refills | Status: DC

## 2022-06-06 NOTE — Patient Instructions (Addendum)
A few things to remember from today's visit:   Type 2 diabetes mellitus with other specified complication, without long-term current use of insulin ([place]) - Plan: Insulin Glargine (BASAGLAR KWIKPEN) 100 UNIT/ML, POC Glucose (CBG), Amb Referral to Nutrition and Diabetic Education  Elevated blood pressure reading  Basaglar 10 U added today, we could discontinue once your diet improves and if blood sugars persistently under 100.  If blood sugar still above 200 in 1 week you can increase insulin to 13 Units daily. Ozempic 0.25 mg weekly for 3 then 0.5 mg for 3 weeks then 1 mg weekly. No changes in Metformin. Start monitoring blood sugars daily before breakfast and a couple times 2 hours after a meal.  If you need refills for medications you take chronically, please call your pharmacy. Do not use My Chart to request refills or for acute issues that need immediate attention. If you send a my chart message, it may take a few days to be addressed, specially if I am not in the office.  Please be sure medication list is accurate. If a new problem present, please set up appointment sooner than planned today.

## 2022-06-06 NOTE — Assessment & Plan Note (Signed)
Has lost about 19-20 Lb sine his alst visit in 03/2022, most likely resulted from hyperglycemia. Consistency with healthy diet and physical activity encouraged.

## 2022-06-06 NOTE — Assessment & Plan Note (Signed)
Problem is not well controlled. Basaglar 10 U started today, can increased to 13 U in a week if BS's > 200. Continue Ozempic but 0.25 mg x 3 wks,0.5 mg x 3 wks,and then 1 mg weekly. No changes in Metformin dose. Start monitoring BS's. Nutrition referral placed. We discussed possible complications of elevated glucose. Regular exercise and healthy diet with avoidance of added sugar food intake is an important part of treatment and encouraged. Annual eye exam, periodic dental and foot care recommended. F/U in 2 months.

## 2022-06-16 ENCOUNTER — Encounter: Payer: Self-pay | Admitting: Family Medicine

## 2022-07-14 ENCOUNTER — Ambulatory Visit: Payer: 59 | Admitting: Dietician

## 2022-08-31 ENCOUNTER — Other Ambulatory Visit: Payer: Self-pay | Admitting: Family Medicine

## 2022-08-31 DIAGNOSIS — G4700 Insomnia, unspecified: Secondary | ICD-10-CM

## 2022-09-01 ENCOUNTER — Other Ambulatory Visit: Payer: Self-pay | Admitting: Family Medicine

## 2022-09-01 DIAGNOSIS — M545 Low back pain, unspecified: Secondary | ICD-10-CM

## 2022-09-14 ENCOUNTER — Other Ambulatory Visit: Payer: Self-pay | Admitting: Family Medicine

## 2022-09-14 DIAGNOSIS — M1990 Unspecified osteoarthritis, unspecified site: Secondary | ICD-10-CM

## 2022-09-14 DIAGNOSIS — G894 Chronic pain syndrome: Secondary | ICD-10-CM

## 2022-09-21 ENCOUNTER — Other Ambulatory Visit: Payer: Self-pay | Admitting: Family Medicine

## 2022-09-21 DIAGNOSIS — E1169 Type 2 diabetes mellitus with other specified complication: Secondary | ICD-10-CM

## 2022-11-18 NOTE — Progress Notes (Unsigned)
HPI: [name] is a 57 y.o. male, who is here today for chronic disease management.  Last seen on 06/07/23. No new problems since his last visit.  Diabetes Mellitus II: Dx'ed about 8-9 years ago. Checking BG at home: *** Medications: Metformin 500 mg bid. Eye exam:08/2020. Foot exam: 03/2022. Negative for symptoms of hypoglycemia, polyuria, polydipsia, numbness extremities, foot ulcers/trauma.  Lab Results  Component Value Date   HGBA1C 7.3 (A) 05/31/2021   Lab Results  Component Value Date   MICROALBUR 0.7 11/24/2019   Chronic pain: Currently he is on tramadol 50 mg twice daily.  He ran out of medication, so has not taken it for a few days. Severe achy ankle pain, mainly left, status post surgery. IP for pain. Without medication pain level is 5-6/10 with med 0/10. Tramadol last filled on 09/01/22 He is also on meloxicam 15 mg daily as needed. Takes Zanaflex 4 mg daily as needed for lower back pain/muscle spasms. *** Anxiety and insomnia: Currently he is on amitriptyline 100 mg at bedtime and clonazepam 2 mg daily as needed. Clonazepam was last filled 09/01/22. Medications also help with insomnia. Sleeping 6 hours.  Review of Systems  Constitutional:  Negative for activity change, appetite change and fever.  HENT:  Negative for nosebleeds and sore throat.   Eyes:  Negative for redness and visual disturbance.  Respiratory:  Negative for cough, shortness of breath and wheezing.   Cardiovascular:  Negative for chest pain, palpitations and leg swelling.  Gastrointestinal:  Negative for abdominal pain, nausea and vomiting.  Musculoskeletal:  Positive for arthralgias and back pain. Negative for gait problem.  Neurological:  Negative for syncope, weakness and headaches.  Psychiatric/Behavioral:  Positive for sleep disturbance. Negative for confusion and hallucinations. The patient is nervous/anxious.   Rest of ROS see pertinent positives and negatives in HPI.  Current  Outpatient Medications on File Prior to Visit  Medication Sig Dispense Refill   amitriptyline (ELAVIL) 100 MG tablet TAKE 1 TABLET BY MOUTH EVERYDAY AT BEDTIME 90 tablet 1   aspirin 81 MG tablet Take 81 mg by mouth daily.     Blood Glucose Monitoring Suppl (ONE TOUCH ULTRA 2) w/Device KIT Use to check blood sugars 1-2 times daily. 1 kit 0   clonazePAM (KLONOPIN) 2 MG tablet Take 1 tablet (2 mg total) by mouth at bedtime as needed for anxiety. 30 tablet 3   glucose blood (ONETOUCH ULTRA) test strip Use to check blood sugars 1-2 times daily. 100 each 12   Insulin Glargine (BASAGLAR KWIKPEN) 100 UNIT/ML Inject 10 Units into the skin at bedtime. 6 mL 1   Insulin Pen Needle 32G X 4 MM MISC To use with insulin. 100 each 3   meloxicam (MOBIC) 15 MG tablet TAKE 1 TABLET BY MOUTH EVERY DAY 90 tablet 1   metFORMIN (GLUCOPHAGE) 500 MG tablet TAKE 1 TABLET BY MOUTH 2 TIMES DAILY WITH A MEAL. 180 tablet 1   omeprazole (PRILOSEC) 20 MG capsule TAKE 1 CAPSULE BY MOUTH EVERY DAY 90 capsule 1   OneTouch UltraSoft 2 Lancets MISC 1 each by Does not apply route daily as needed. Use to check blood sugars 1-2 times daily. 100 each 3   rosuvastatin (CRESTOR) 20 MG tablet Take 1 tablet (20 mg total) by mouth daily. 90 tablet 2   Semaglutide,0.25 or 0.5MG /DOS, (OZEMPIC, 0.25 OR 0.5 MG/DOSE,) 2 MG/3ML SOPN 0.25 mg weekly for 3 weeks then 0.5 mg weekly for 3 weeks then 1 mg weekly for 3  weeks. 6 mL 2   sildenafil (VIAGRA) 100 MG tablet Take 1 tablet (100 mg total) by mouth daily as needed for erectile dysfunction. 20 tablet 2   tiZANidine (ZANAFLEX) 4 MG tablet TAKE 1 TABLET BY MOUTH DAILY AS NEEDED FOR MUSCLE SPASMS. 90 tablet 1   traMADol (ULTRAM) 50 MG tablet Take 1 tablet (50 mg total) by mouth every 12 (twelve) hours as needed. 60 tablet 3   No current facility-administered medications on file prior to visit.   Past Medical History:  Diagnosis Date   Anxiety    Depression    Diabetes mellitus    GERD  (gastroesophageal reflux disease)    H/O hiatal hernia    ?   Mental disorder    OSA (obstructive sleep apnea)    Not wearing CPAP   Parotid mass    rt   Sleep apnea    just started on cpap 2 weeks   Stones in the urinary tract    No Known Allergies  Social History   Socioeconomic History   Marital status: Single    Spouse name: Not on file   Number of children: Not on file   Years of education: Not on file   Highest education level: Not on file  Occupational History   Not on file  Tobacco Use   Smoking status: Former    Packs/day: 0.25    Years: 5.00    Additional pack years: 0.00    Total pack years: 1.25    Types: Cigarettes    Quit date: 05/28/2016    Years since quitting: 6.4   Smokeless tobacco: Former    Types: Chew   Tobacco comments:    marijuana 06/26/12  occ social drinker  Vaping Use   Vaping Use: Never used  Substance and Sexual Activity   Alcohol use: Yes    Comment: monthly   Drug use: Yes    Frequency: 1.0 times per week    Types: Marijuana    Comment: denies recent use   Sexual activity: Not on file  Other Topics Concern   Not on file  Social History Narrative   Not on file   Social Determinants of Health   Financial Resource Strain: Not on file  Food Insecurity: Not on file  Transportation Needs: Not on file  Physical Activity: Not on file  Stress: Not on file  Social Connections: Not on file   There were no vitals filed for this visit.  There is no height or weight on file to calculate BMI.  Physical Exam Vitals and nursing note reviewed.  Constitutional:      General: He is not in acute distress.    Appearance: He is well-developed.  HENT:     Head: Normocephalic and atraumatic.     Mouth/Throat:     Mouth: Mucous membranes are moist.     Pharynx: Oropharynx is clear.  Eyes:     Conjunctiva/sclera: Conjunctivae normal.  Cardiovascular:     Rate and Rhythm: Normal rate and regular rhythm.     Pulses:          Dorsalis  pedis pulses are 2+ on the right side and 2+ on the left side.     Heart sounds: No murmur heard.    Comments: Trace pitting LE edema, bilateral. Pulmonary:     Effort: Pulmonary effort is normal. No respiratory distress.     Breath sounds: Normal breath sounds.  Abdominal:     Palpations: Abdomen is  soft. There is no hepatomegaly or mass.     Tenderness: There is no abdominal tenderness.  Lymphadenopathy:     Cervical: No cervical adenopathy.  Skin:    General: Skin is warm.     Findings: No erythema or rash.  Neurological:     Mental Status: He is alert and oriented to person, place, and time.     Cranial Nerves: No cranial nerve deficit.     Gait: Gait normal.  Psychiatric:        Mood and Affect: Mood and affect normal.    ASSESSMENT AND PLAN:  [name] was seen today for follow-up.  Diagnoses and all orders for this visit: There are no diagnoses linked to this encounter.  No follow-ups on file.  Matthew Nobbe G. [place], MD  [facility]. [place] office.

## 2022-11-20 ENCOUNTER — Ambulatory Visit (INDEPENDENT_AMBULATORY_CARE_PROVIDER_SITE_OTHER): Payer: 59 | Admitting: Family Medicine

## 2022-11-20 ENCOUNTER — Encounter: Payer: Self-pay | Admitting: Family Medicine

## 2022-11-20 VITALS — BP 120/80 | HR 70 | Temp 98.5°F | Resp 16 | Ht 71.0 in | Wt 244.5 lb

## 2022-11-20 DIAGNOSIS — G894 Chronic pain syndrome: Secondary | ICD-10-CM | POA: Diagnosis not present

## 2022-11-20 DIAGNOSIS — F419 Anxiety disorder, unspecified: Secondary | ICD-10-CM

## 2022-11-20 DIAGNOSIS — E1169 Type 2 diabetes mellitus with other specified complication: Secondary | ICD-10-CM

## 2022-11-20 DIAGNOSIS — G4700 Insomnia, unspecified: Secondary | ICD-10-CM | POA: Diagnosis not present

## 2022-11-20 DIAGNOSIS — R7401 Elevation of levels of liver transaminase levels: Secondary | ICD-10-CM | POA: Diagnosis not present

## 2022-11-20 DIAGNOSIS — E669 Obesity, unspecified: Secondary | ICD-10-CM

## 2022-11-20 DIAGNOSIS — M1990 Unspecified osteoarthritis, unspecified site: Secondary | ICD-10-CM

## 2022-11-20 LAB — BASIC METABOLIC PANEL
BUN: 15 mg/dL (ref 6–23)
CO2: 26 mEq/L (ref 19–32)
Calcium: 9.6 mg/dL (ref 8.4–10.5)
Chloride: 104 mEq/L (ref 96–112)
Creatinine, Ser: 0.84 mg/dL (ref 0.40–1.50)
GFR: 96.12 mL/min (ref >60.00)
Glucose, Bld: 93 mg/dL (ref 70–99)
Potassium: 3.8 mEq/L (ref 3.5–5.1)
Sodium: 138 mEq/L (ref 135–145)

## 2022-11-20 LAB — HEPATIC FUNCTION PANEL
ALT: 25 U/L (ref 0–53)
AST: 21 U/L (ref 0–37)
Albumin: 4.5 g/dL (ref 3.5–5.2)
Alkaline Phosphatase: 49 U/L (ref 39–117)
Bilirubin, Direct: 0.1 mg/dL (ref 0.0–0.3)
Total Bilirubin: 0.5 mg/dL (ref 0.2–1.2)
Total Protein: 7.1 g/dL (ref 6.0–8.3)

## 2022-11-20 LAB — POCT GLYCOSYLATED HEMOGLOBIN (HGB A1C): HbA1c, POC (prediabetic range): 6.1 % (ref 5.7–6.4)

## 2022-11-20 LAB — MICROALBUMIN / CREATININE URINE RATIO
Creatinine,U: 151.4 mg/dL
Microalb Creat Ratio: 0.9 mg/g (ref 0.0–30.0)
Microalb, Ur: 1.3 mg/dL (ref 0.0–1.9)

## 2022-11-20 MED ORDER — CLONAZEPAM 2 MG PO TABS: 2.0000 mg | ORAL_TABLET | Freq: Every evening | ORAL | 3 refills | Status: DC | PRN

## 2022-11-20 MED ORDER — BASAGLAR KWIKPEN 100 UNIT/ML ~~LOC~~ SOPN: 10.0000 [IU] | PEN_INJECTOR | Freq: Every day | SUBCUTANEOUS | 1 refills | Status: DC

## 2022-11-20 MED ORDER — TRAMADOL HCL 50 MG PO TABS: 50.0000 mg | ORAL_TABLET | Freq: Two times a day (BID) | ORAL | 3 refills | Status: DC | PRN

## 2022-11-20 MED ORDER — OZEMPIC (0.25 OR 0.5 MG/DOSE) 2 MG/3ML ~~LOC~~ SOPN: 0.5000 mg | PEN_INJECTOR | SUBCUTANEOUS | 2 refills | Status: DC

## 2022-11-20 NOTE — Assessment & Plan Note (Signed)
Problem has been stable with current management. Continue tramadol 50 mg twice daily as needed and meloxicam 15 mg daily as needed. Side effects discussed. Med contract 03/2022. PMP reviewed.

## 2022-11-20 NOTE — Assessment & Plan Note (Addendum)
Mild and asymptomatic. Further recommendations according to LFT's results. Continue avoiding alcohol intake.

## 2022-11-20 NOTE — Assessment & Plan Note (Signed)
Stable. Continue Clonazepam 2 mg daily as needed. F/U in 5 months.

## 2022-11-20 NOTE — Patient Instructions (Addendum)
A few things to remember from today's visit:  Type 2 diabetes mellitus with other specified complication, without long-term current use of insulin - Plan: POC HgB A1c, Microalbumin / creatinine urine ratio, Basic metabolic panel, Insulin Glargine (BASAGLAR KWIKPEN) 100 UNIT/ML, Semaglutide,0.25 or 0.5MG /DOS, (OZEMPIC, 0.25 OR 0.5 MG/DOSE,) 2 MG/3ML SOPN  Insomnia, unspecified type  Chronic pain disorder - Plan: traMADol (ULTRAM) 50 MG tablet  Anxiety disorder, unspecified type - Plan: clonazePAM (KLONOPIN) 2 MG tablet  Elevated transaminase level - Plan: Hepatic function panel  Osteoarthritis, unspecified osteoarthritis type, unspecified site - Plan: traMADol (ULTRAM) 50 MG tablet Try taking metformin in the middle of the meal. No changes today.  If you need refills for medications you take chronically, please call your pharmacy. Do not use My Chart to request refills or for acute issues that need immediate attention. If you send a my chart message, it may take a few days to be addressed, specially if I am not in the office.  Please be sure medication list is accurate. If a new problem present, please set up appointment sooner than planned today.

## 2022-11-20 NOTE — Assessment & Plan Note (Signed)
Problem is stable. Tramadol and meloxicam are still helping with pain. We discussed some side effects of medications. No changes in current management.

## 2022-11-20 NOTE — Assessment & Plan Note (Signed)
Stable. Continue Amitriptyline 100 mg at bedtime. Good sleep hygiene also encouraged.

## 2022-11-20 NOTE — Assessment & Plan Note (Signed)
HgA1C improved and now at goal. Cont Metformin 500 mg bid, taking in the middle of the meal instead at the beginning may help with GI side effects, reported as mild. Continue Basaglar 10 U daily and Ozempic 0.5 mg weekly. Annual eye exam, periodic dental and foot care recommended. F/U in 5 months.

## 2022-11-20 NOTE — Assessment & Plan Note (Signed)
He understands the benefits of wt loss as well as adverse effects of obesity. He has lost about 12 Lb since his last visit, 05/2022. Consistency with healthy diet and physical activity encouraged.

## 2022-11-24 ENCOUNTER — Other Ambulatory Visit: Payer: Self-pay | Admitting: Family Medicine

## 2022-11-24 DIAGNOSIS — E1169 Type 2 diabetes mellitus with other specified complication: Secondary | ICD-10-CM

## 2022-12-19 ENCOUNTER — Telehealth: Payer: Self-pay | Admitting: Family Medicine

## 2022-12-19 MED ORDER — CVS BLOOD GLUCOSE METER W/DEVICE KIT: PACK | 0 refills | Status: AC

## 2022-12-19 MED ORDER — TRESIBA FLEXTOUCH 100 UNIT/ML ~~LOC~~ SOPN: 10.0000 [IU] | PEN_INJECTOR | Freq: Every day | SUBCUTANEOUS | 1 refills | Status: DC

## 2022-12-19 NOTE — Telephone Encounter (Signed)
Matthew [name] is [facility] that matches  with testing strip she believes its [facility]

## 2022-12-19 NOTE — Telephone Encounter (Signed)
I left patient a voicemail letting him know that a new insulin that is on his formulary has been sent in and the meter with the lower costing test strips has been sent in as well. I advised him to call the office back with any questions or if the new insulin is still too expensive.

## 2022-12-19 NOTE — Addendum Note (Signed)
Addended by: Kathreen Devoid on: 12/19/2022 02:56 PM   Modules accepted: Orders

## 2022-12-19 NOTE — Telephone Encounter (Addendum)
I spoke with patient. The price of the Basgalar and test strips have gone up to $125 a piece. I advised pt that I would look at formulary to see what may covered at a cheaper cost. They did find strips OTC that were $16, and they will call me back to let me know the name so we can make sure he has the right meter.

## 2022-12-19 NOTE — Telephone Encounter (Signed)
Basaglar can be changed to [place] or [place] same dose. Thanks, BJ

## 2022-12-19 NOTE — Telephone Encounter (Signed)
It looks like some other options are [place], [place], [place], or [place].

## 2022-12-19 NOTE — Addendum Note (Signed)
Addended by: Weyman Croon E on: 12/19/2022 12:46 PM   Modules accepted: Orders

## 2022-12-21 ENCOUNTER — Other Ambulatory Visit: Payer: Self-pay | Admitting: Family Medicine

## 2022-12-21 DIAGNOSIS — G8929 Other chronic pain: Secondary | ICD-10-CM

## 2022-12-21 DIAGNOSIS — G4700 Insomnia, unspecified: Secondary | ICD-10-CM

## 2022-12-21 NOTE — Telephone Encounter (Signed)
Mother of Pt called to say she just left the pharmacy and yes, the new insulin is still too expensive (Over $200)  Mother informed MD is OOO on Thursdays.

## 2022-12-22 NOTE — Telephone Encounter (Signed)
All the insulins are too expensive, he'll have to be changed to oral medication.

## 2022-12-24 ENCOUNTER — Encounter: Payer: Self-pay | Admitting: Family Medicine

## 2022-12-25 MED ORDER — TRESIBA FLEXTOUCH 100 UNIT/ML ~~LOC~~ SOPN: 10.0000 [IU] | PEN_INJECTOR | Freq: Every day | SUBCUTANEOUS | 1 refills | Status: DC

## 2022-12-25 NOTE — Telephone Encounter (Signed)
See my chart encounter.

## 2022-12-27 NOTE — Telephone Encounter (Signed)
Insulins are all too expensive at [facility].

## 2022-12-27 NOTE — Telephone Encounter (Signed)
Pt wife is calling and the insulin is the same price as [facility] and pt can not afford med. Please advise and please call pt wife Matthew [name]

## 2022-12-29 MED ORDER — PIOGLITAZONE HCL 15 MG PO TABS: 15.0000 mg | ORAL_TABLET | Freq: Every day | ORAL | 3 refills | Status: DC

## 2022-12-29 NOTE — Addendum Note (Signed)
Addended by: Kathreen Devoid on: 12/29/2022 02:14 PM   Modules accepted: Orders

## 2022-12-29 NOTE — Telephone Encounter (Signed)
I left patient's mom a voicemail letting her know we sent a new medication into the pharmacy.

## 2023-01-26 ENCOUNTER — Other Ambulatory Visit: Payer: Self-pay

## 2023-01-26 DIAGNOSIS — E119 Type 2 diabetes mellitus without complications: Secondary | ICD-10-CM | POA: Insufficient documentation

## 2023-01-26 DIAGNOSIS — Z87891 Personal history of nicotine dependence: Secondary | ICD-10-CM | POA: Diagnosis not present

## 2023-01-26 DIAGNOSIS — S61411A Laceration without foreign body of right hand, initial encounter: Secondary | ICD-10-CM | POA: Insufficient documentation

## 2023-01-26 DIAGNOSIS — G4733 Obstructive sleep apnea (adult) (pediatric): Secondary | ICD-10-CM | POA: Diagnosis not present

## 2023-01-26 DIAGNOSIS — Z79899 Other long term (current) drug therapy: Secondary | ICD-10-CM | POA: Insufficient documentation

## 2023-01-26 DIAGNOSIS — E669 Obesity, unspecified: Secondary | ICD-10-CM | POA: Insufficient documentation

## 2023-01-26 DIAGNOSIS — W268XXA Contact with other sharp object(s), not elsewhere classified, initial encounter: Secondary | ICD-10-CM | POA: Insufficient documentation

## 2023-01-26 DIAGNOSIS — S60221A Contusion of right hand, initial encounter: Secondary | ICD-10-CM | POA: Insufficient documentation

## 2023-01-26 DIAGNOSIS — S6991XA Unspecified injury of right wrist, hand and finger(s), initial encounter: Secondary | ICD-10-CM | POA: Diagnosis present

## 2023-01-26 DIAGNOSIS — Z7984 Long term (current) use of oral hypoglycemic drugs: Secondary | ICD-10-CM | POA: Diagnosis not present

## 2023-01-26 DIAGNOSIS — Y990 Civilian activity done for income or pay: Secondary | ICD-10-CM | POA: Diagnosis not present

## 2023-01-26 NOTE — ED Triage Notes (Signed)
Pt presents to ED POV. Pt c/o R hand pain. Laceration between thumb and pointer finger. Pt reports that yesterday he was working and his hand got crushed in between two pieces of equipment. Tetanus unknown.

## 2023-01-27 ENCOUNTER — Encounter (HOSPITAL_BASED_OUTPATIENT_CLINIC_OR_DEPARTMENT_OTHER): Payer: Self-pay | Admitting: Emergency Medicine

## 2023-01-27 ENCOUNTER — Emergency Department (HOSPITAL_BASED_OUTPATIENT_CLINIC_OR_DEPARTMENT_OTHER)
Admission: EM | Admit: 2023-01-27 | Discharge: 2023-01-27 | Disposition: A | Discharge status: Home / Self Care | Payer: 59 | Attending: Emergency Medicine | Admitting: Emergency Medicine

## 2023-01-27 ENCOUNTER — Emergency Department (HOSPITAL_BASED_OUTPATIENT_CLINIC_OR_DEPARTMENT_OTHER): Payer: 59

## 2023-01-27 DIAGNOSIS — S61411A Laceration without foreign body of right hand, initial encounter: Secondary | ICD-10-CM

## 2023-01-27 DIAGNOSIS — S60221A Contusion of right hand, initial encounter: Secondary | ICD-10-CM

## 2023-01-27 MED ORDER — LIDOCAINE-EPINEPHRINE (PF) 2 %-1:200000 IJ SOLN
INTRAMUSCULAR | Status: AC
  Administered 2023-01-27: 20 mL
  Filled 2023-01-27: qty 20

## 2023-01-27 MED ORDER — LIDOCAINE-EPINEPHRINE (PF) 2 %-1:200000 IJ SOLN: 20.0000 mL | Freq: Once | INTRAMUSCULAR | Status: DC

## 2023-01-27 NOTE — ED Provider Notes (Signed)
[place] [facility]ERG[facility] DEPARTMENT AT [facility] Provider Note  CSN: [id number] Arrival date & time: 01/26/23 2350  Chief Complaint(s) Hand Injury  HPI Matthew [name] is a 57 y.o. male with past medical history as below, significant for anxiety, depression, DM, GERD, OSA who presents to the ED with complaint of right hand injury while at work.  Reports that he was moving a metal object at work when he sustained injury to his right hand, laceration to the webspace between his index finger and thumb.  He is RHD.  Last tetanus shot was around 1 year ago.  He cleaned the wound at home and applied antibiotic ointment.  Wound continued to have oozing and bleeding. No other injury, no pain to wrist / elbow to right UE.  No head injuries   Past Medical History Past Medical History:  Diagnosis Date   Anxiety    Depression    Diabetes mellitus    GERD (gastroesophageal reflux disease)    H/O hiatal hernia    ?   Mental disorder    OSA (obstructive sleep apnea)    Not wearing CPAP   Parotid mass    rt   Sleep apnea    just started on cpap 2 weeks   Stones in the urinary tract    Patient Active Problem List   Diagnosis Date Noted   Obesity with body mass index (BMI) of 30.0 to 39.9 11/20/2022   Erectile dysfunction 03/14/2022   Pain in lower back 03/14/2022   Whiplash 04/17/2018   Chronic pain disorder 01/30/2017   Elevated transaminase level 01/30/2017   Insomnia 04/10/2016   OSA (obstructive sleep apnea) 04/10/2016   BPH associated with nocturia 07/16/2015   GERD (gastroesophageal reflux disease) 07/16/2015   OA (osteoarthritis) 07/16/2015   Hypogonadism, male 02/21/2013   Type 2 diabetes mellitus with other specified complication (HCC) 01/24/2013   Hyperlipidemia associated with type 2 diabetes mellitus (HCC) 01/24/2013   Anxiety disorder 01/24/2013   Depression 01/24/2013   Home Medication(s) Prior to Admission medications   Medication Sig Start Date End Date Taking?  Authorizing Provider  amitriptyline (ELAVIL) 100 MG tablet TAKE 1 TABLET BY MOUTH EVERYDAY AT BEDTIME 12/22/22   [place], Betty G, MD  aspirin 81 MG tablet Take 81 mg by mouth daily.    [provider]  Blood Glucose Monitoring Suppl ([employer] BLOOD GLUCOSE METER) w/Device KIT Use to check blood sugars daily. 12/19/22   [place], Betty G, MD  clonazePAM (KLONOPIN) 2 MG tablet Take 1 tablet (2 mg total) by mouth at bedtime as needed for anxiety. 11/20/22   [place], Betty G, MD  glucose blood (ONETOUCH ULTRA) test strip Use to check blood sugars 1-2 times daily. 05/30/22   [place], Betty G, MD  insulin degludec (TRESIBA FLEXTOUCH) 100 UNIT/ML FlexTouch Pen Inject 10 Units into the skin daily. 12/25/22   [place], Betty G, MD  Insulin Pen Needle 32G X 4 MM MISC To use with insulin. 06/06/22   [place], Betty G, MD  meloxicam (MOBIC) 15 MG tablet TAKE 1 TABLET BY MOUTH EVERY DAY 09/15/22   [place], Betty G, MD  metFORMIN (GLUCOPHAGE) 500 MG tablet TAKE 1 TABLET BY MOUTH 2 TIMES DAILY WITH A MEAL. 09/22/22   [place], Betty G, MD  omeprazole (PRILOSEC) 20 MG capsule TAKE 1 CAPSULE BY MOUTH EVERY DAY 05/30/22   [place], Betty G, MD  OneTouch UltraSoft 2 Lancets MISC 1 each by Does not apply route daily as needed. Use to check blood  sugars 1-2 times daily. 05/31/22   [place], Betty G, MD  pioglitazone (ACTOS) 15 MG tablet Take 1 tablet (15 mg total) by mouth daily. 12/29/22   [place], Betty G, MD  rosuvastatin (CRESTOR) 20 MG tablet TAKE 1 TABLET BY MOUTH EVERY DAY 11/24/22   [place], Betty G, MD  Semaglutide,0.25 or 0.5MG /DOS, (OZEMPIC, 0.25 OR 0.5 MG/DOSE,) 2 MG/3ML SOPN Inject 0.5 mg into the skin once a week. 0.25 mg weekly for 3 weeks then 0.5 mg weekly for 3 weeks then 1 mg weekly for 3 weeks. 11/20/22   [place], Betty G, MD  sildenafil (VIAGRA) 100 MG tablet Take 1 tablet (100 mg total) by mouth daily as needed for erectile dysfunction. 03/22/22   [place], Betty G, MD  tiZANidine (ZANAFLEX) 4 MG tablet TAKE 1 TABLET BY MOUTH  DAILY AS NEEDED FOR MUSCLE SPASMS. 12/22/22   [place], Betty G, MD  traMADol (ULTRAM) 50 MG tablet Take 1 tablet (50 mg total) by mouth every 12 (twelve) hours as needed. 11/20/22   [place], Betty G, MD                                                                                                                                    Past Surgical History Past Surgical History:  Procedure Laterality Date   HEMATOMA EVACUATION  07/04/2012   Procedure: EVACUATION HEMATOMA;  Surgeon: Christia Reading, MD;  Location: [facility] OR;  Service: ENT;  Laterality: Right;   PAROTIDECTOMY  07/04/2012   Procedure: PAROTIDECTOMY;  Surgeon: Christia Reading, MD;  Location: [facility] OR;  Service: ENT;  Laterality: Right;  right parotidectomy   Family History Family History  Problem Relation Age of Onset   Cancer Mother        breast   Mental illness Mother    Diabetes Father    Stroke Brother     Social History Social History   Tobacco Use   Smoking status: Former    Packs/day: 0.25    Years: 5.00    Additional pack years: 0.00    Total pack years: 1.25    Types: Cigarettes    Quit date: 05/28/2016    Years since quitting: 6.6   Smokeless tobacco: Former    Types: Chew   Tobacco comments:    marijuana 06/26/12  occ social drinker  Vaping Use   Vaping Use: Never used  Substance Use Topics   Alcohol use: Yes    Comment: monthly   Drug use: Yes    Frequency: 1.0 times per week    Types: Marijuana    Comment: denies recent use   Allergies Patient has no known allergies.  Review of Systems Review of Systems  Constitutional:  Negative for chills and fever.  HENT:  Negative for facial swelling and trouble swallowing.   Eyes:  Negative for photophobia and visual disturbance.  Respiratory:  Negative for cough and shortness of breath.   Cardiovascular:  Negative for chest pain and palpitations.  Gastrointestinal:  Negative for abdominal pain, nausea and vomiting.  Endocrine: Negative for polydipsia and polyuria.   Genitourinary:  Negative for difficulty urinating and hematuria.  Musculoskeletal:  Negative for gait problem and joint swelling.  Skin:  Positive for wound. Negative for pallor and rash.  Neurological:  Negative for syncope and headaches.  Psychiatric/Behavioral:  Negative for agitation and confusion.     Physical Exam Vital Signs  I have reviewed the triage vital signs BP 129/77   Pulse 75   Temp 97.9 F (36.6 C)   Resp 18   SpO2 100%  Physical Exam Vitals and nursing note reviewed.  Constitutional:      General: He is not in acute distress.    Appearance: He is well-developed.  HENT:     Head: Normocephalic and atraumatic.     Right Ear: External ear normal.     Left Ear: External ear normal.     Mouth/Throat:     Mouth: Mucous membranes are moist.  Eyes:     General: No scleral icterus. Cardiovascular:     Rate and Rhythm: Normal rate and regular rhythm.  Pulmonary:     Effort: Pulmonary effort is normal. No respiratory distress.     Breath sounds: No stridor.  Abdominal:     General: There is no distension.     Palpations: Abdomen is soft.  Musculoskeletal:        General: Normal range of motion.     Right lower leg: No edema.     Left lower leg: No edema.  Skin:    General: Skin is warm and dry.     Capillary Refill: Capillary refill takes less than 2 seconds.     Findings: Laceration present.     Comments: Approximate 2 cm laceration to webspace between first and second digit.  No ongoing bleeding.  Full range of motion to right hand digits and wrist.  NVI.  Cap refill is brisk to right hand.  Radial Pulse brisk.  Neurological:     Mental Status: He is alert and oriented to person, place, and time.     GCS: GCS eye subscore is 4. GCS verbal subscore is 5. GCS motor subscore is 6.  Psychiatric:        Mood and Affect: Mood normal.        Behavior: Behavior normal.     ED Results and Treatments Labs (all labs ordered are listed, but only abnormal  results are displayed) Labs Reviewed - No data to display                                                                                                                        Radiology DG Hand Complete Right  Result Date: 01/27/2023 CLINICAL DATA:  Right hand pain. EXAM: RIGHT HAND - COMPLETE 3+ VIEW COMPARISON:  Right hand radiograph dated 02/22/2020. FINDINGS: Prior internal fixation of proximal phalanx of the fifth digit. There  is no acute fracture or dislocation. The bones are osteopenic. The soft tissues are unremarkable. IMPRESSION: No acute fracture or dislocation. Electronically Signed   By: Elgie Collard M.D.   On: 01/27/2023 00:24    Pertinent labs & imaging results that were available during my care of the patient were reviewed by me and considered in my medical decision making (see MDM for details).  Medications Ordered in ED Medications  lidocaine-EPINEPHrine (XYLOCAINE W/EPI) 2 %-1:200000 (PF) injection 20 mL (20 mLs Intradermal Not Given 01/27/23 0352)  lidocaine-EPINEPHrine (XYLOCAINE W/EPI) 2 %-1:200000 (PF) injection (20 mLs  Given 01/27/23 0351)                                                                                                                                     Procedures .Marland KitchenLaceration Repair  Date/Time: 01/27/2023 4:04 AM  Performed by: Sloan Leiter, DO Authorized by: Sloan Leiter, DO   Consent:    Consent obtained:  Verbal   Consent given by:  Patient   Risks, benefits, and alternatives were discussed: yes     Risks discussed:  Infection, pain, need for additional repair and poor cosmetic result   Alternatives discussed:  No treatment and delayed treatment Universal protocol:    Procedure explained and questions answered to patient or proxy's satisfaction: yes     Immediately prior to procedure, a time out was called: yes     Patient identity confirmed:  Verbally with patient and arm band Anesthesia:    Anesthesia method:  Local infiltration    Local anesthetic:  Lidocaine 2% WITH epi Laceration details:    Location:  Hand   Hand location:  R hand, dorsum (web Space between first and second digit)   Length (cm):  2   Depth (mm):  2 Pre-procedure details:    Preparation:  Patient was prepped and draped in usual sterile fashion and imaging obtained to evaluate for foreign bodies Exploration:    Limited defect created (wound extended): yes     Hemostasis achieved with:  Direct pressure   Imaging obtained: x-ray     Imaging outcome: foreign body not noted     Wound exploration: wound explored through full range of motion and entire depth of wound visualized     Contaminated: no   Treatment:    Area cleansed with:  Saline   Amount of cleaning:  Extensive   Irrigation solution:  Sterile saline   Irrigation volume:  100   Irrigation method:  Pressure wash   Debridement:  None   Undermining:  None   Scar revision: no   Skin repair:    Repair method:  Sutures   Suture size:  4-0   Suture material:  Nylon   Suture technique:  Simple interrupted   Number of sutures:  3 Approximation:    Approximation:  Loose Repair type:    Repair type:  Simple Post-procedure details:  Dressing:  Non-adherent dressing and bulky dressing   Procedure completion:  Tolerated well, no immediate complications   (including critical care time)  Medical Decision Making / ED Course    Medical Decision Making:    Matthew [name] is a 57 y.o. male with past medical history as below, significant for anxiety, depression, DM, GERD, OSA who presents to the ED with complaint of right hand injury while at work.. The complaint involves an extensive differential diagnosis and also carries with it a high risk of complications and morbidity.  Serious etiology was considered. Ddx includes but is not limited to: Foreign body, soft tissue injury, laceration, etc.  Complete initial physical exam performed, notably the patient  was no acute distress, walking  around treatment area.    Reviewed and confirmed nursing documentation for past medical history, family history, social history.  Vital signs reviewed.    Clinical Course as of 01/27/23 0406  Sat Jan 27, 2023  0308 Hand xr reviewed by myself, no fx or retained fb. UTD on tetanus per pt (last year), wound irrigated at bedside w/ tap water. Pt agreeable to suture repair [SG]    Clinical Course User Index [SG] Sloan Leiter, DO   Somewhat delayed presentation of laceration, approximately 24 hours since the injury occurred.  Does not appear to have evidence of cellulitis.  Was irrigated copiously at bedside.  Approximate loosely given delayed presentation.  Wound care instructions were emphasized to patient  Patient presents with laceration to their web space right hand between first and second digit. Please see laceration repair procedure note for further details. Exam benign, neurovascularly intact with full range of motion in surrounding joints. Discussed suture care and removal with pt/family; notified to f/u with PCP to have sutures removed in 7 to 10 days; reviewed worsening s/s infection with verbalized understanding with pt/family.    Patient in no distress and overall condition improved here in the ED. Detailed discussions were had with the patient regarding current findings, and need for close f/u with PCP or on call doctor. The patient has been instructed to return immediately if the symptoms worsen in any way for re-evaluation. Patient verbalized understanding and is in agreement with current care plan. All questions answered prior to discharge.       Additional history obtained: -Additional history obtained from na -External records from outside source obtained and reviewed including: Chart review including previous notes, labs, imaging, consultation notes including prior ED visits, primary care documentation of prior labs and imaging   Lab Tests: na  EKG   EKG  Interpretation  Date/Time:    Ventricular Rate:    PR Interval:    QRS Duration:   QT Interval:    QTC Calculation:   R Axis:     Text Interpretation:           Imaging Studies ordered: I ordered imaging studies including hand xr I independently visualized the following imaging with scope of interpretation limited to determining acute life threatening conditions related to emergency care; findings noted above, significant for no fb or fx I independently visualized and interpreted imaging. I agree with the radiologist interpretation   Medicines ordered and prescription drug management: Meds ordered this encounter  Medications   lidocaine-EPINEPHrine (XYLOCAINE W/EPI) 2 %-1:200000 (PF) injection    [place], Caitlin : cabinet override   lidocaine-EPINEPHrine (XYLOCAINE W/EPI) 2 %-1:200000 (PF) injection 20 mL    -I have reviewed the patients home medicines and have made adjustments as  needed   Consultations Obtained: na   Cardiac Monitoring: na  Social Determinants of Health:  Diagnosis or treatment significantly limited by social determinants of health: former smoker   Reevaluation: After the interventions noted above, I reevaluated the patient and found that they have improved  Co morbidities that complicate the patient evaluation  Past Medical History:  Diagnosis Date   Anxiety    Depression    Diabetes mellitus    GERD (gastroesophageal reflux disease)    H/O hiatal hernia    ?   Mental disorder    OSA (obstructive sleep apnea)    Not wearing CPAP   Parotid mass    rt   Sleep apnea    just started on cpap 2 weeks   Stones in the urinary tract       Dispostion: Disposition decision including need for hospitalization was considered, and patient discharged from emergency department.    Final Clinical Impression(s) / ED Diagnoses Final diagnoses:  Laceration of right hand without foreign body, initial encounter  Contusion of right hand,  initial encounter     This chart was dictated using voice recognition software.  Despite best efforts to proofread,  errors can occur which can change the documentation meaning.    Sloan Leiter, DO 01/27/23 [id number]

## 2023-01-27 NOTE — Discharge Instructions (Addendum)
Keep the wound clean and as dry as possible. Do not immerse or soak the wound in water. This means no swimming, washing dishes (unless thick rubber gloves are used), baths, or hot tubs until the stitches are removed or after about two weeks if absorbable suture material was used. Leave original bandages on the wound for the first 24 hours. After this time, showering or rinsing is recommended, rather than bathing. the first day, remove old bandages and gently cleanse the wound with soap and water. Cleansing twice a day prevents buildup of debris and will result in easier suture removal. Have the wound reevaluated for potential suture removal on the Upper extremity in 7 -10 days  It was a pleasure caring for you today in the emergency department.  Please return to the emergency department for any worsening or worrisome symptoms.

## 2023-02-22 ENCOUNTER — Other Ambulatory Visit: Payer: Self-pay | Admitting: Family Medicine

## 2023-02-22 DIAGNOSIS — N529 Male erectile dysfunction, unspecified: Secondary | ICD-10-CM

## 2023-02-23 ENCOUNTER — Telehealth: Payer: Self-pay | Admitting: Family Medicine

## 2023-02-23 MED ORDER — PIOGLITAZONE HCL 15 MG PO TABS: 15.0000 mg | ORAL_TABLET | Freq: Every day | ORAL | 3 refills | Status: DC

## 2023-02-23 NOTE — Telephone Encounter (Signed)
pioglitazone (ACTOS) 15 MG tablet   Please send prescription to"  [id number] - [place] ([place]), [place] - [address] Phone: [phone]  Fax: [phone]

## 2023-02-23 NOTE — Telephone Encounter (Signed)
Rx sent 

## 2023-03-13 ENCOUNTER — Other Ambulatory Visit: Payer: Self-pay | Admitting: Family Medicine

## 2023-03-13 DIAGNOSIS — F419 Anxiety disorder, unspecified: Secondary | ICD-10-CM

## 2023-03-13 NOTE — Telephone Encounter (Signed)
LOV 11/20/2022 NOV not scheduled, due in September LF 02/21/2023

## 2023-03-20 ENCOUNTER — Telehealth: Payer: Self-pay | Admitting: Family Medicine

## 2023-03-20 DIAGNOSIS — F419 Anxiety disorder, unspecified: Secondary | ICD-10-CM

## 2023-03-20 NOTE — Telephone Encounter (Signed)
Pt needs a refill on Clonazepam called into [facility] at [facility].

## 2023-03-20 NOTE — Telephone Encounter (Signed)
Rx was sent in 8/5.

## 2023-04-08 ENCOUNTER — Other Ambulatory Visit: Payer: Self-pay | Admitting: Family Medicine

## 2023-05-02 ENCOUNTER — Other Ambulatory Visit: Payer: Self-pay | Admitting: Family Medicine

## 2023-05-02 DIAGNOSIS — G8929 Other chronic pain: Secondary | ICD-10-CM

## 2023-05-02 DIAGNOSIS — G4700 Insomnia, unspecified: Secondary | ICD-10-CM

## 2023-05-02 DIAGNOSIS — M1990 Unspecified osteoarthritis, unspecified site: Secondary | ICD-10-CM

## 2023-05-02 DIAGNOSIS — G894 Chronic pain syndrome: Secondary | ICD-10-CM

## 2023-05-02 DIAGNOSIS — E1169 Type 2 diabetes mellitus with other specified complication: Secondary | ICD-10-CM

## 2023-05-02 NOTE — Telephone Encounter (Signed)
LOV:11/20/22

## 2023-05-03 ENCOUNTER — Other Ambulatory Visit: Payer: Self-pay | Admitting: Family Medicine

## 2023-05-03 DIAGNOSIS — G894 Chronic pain syndrome: Secondary | ICD-10-CM

## 2023-05-03 DIAGNOSIS — M1990 Unspecified osteoarthritis, unspecified site: Secondary | ICD-10-CM

## 2023-05-04 ENCOUNTER — Encounter: Payer: Self-pay | Admitting: Family Medicine

## 2023-05-04 MED ORDER — AMITRIPTYLINE HCL 100 MG PO TABS
ORAL_TABLET | ORAL | 1 refills | Status: DC
Start: 2023-05-04 — End: 2023-08-06

## 2023-05-04 MED ORDER — METFORMIN HCL 500 MG PO TABS
500.0000 mg | ORAL_TABLET | Freq: Two times a day (BID) | ORAL | 1 refills | Status: DC
Start: 2023-05-04 — End: 2023-08-06

## 2023-05-04 MED ORDER — TIZANIDINE HCL 4 MG PO TABS
ORAL_TABLET | ORAL | 1 refills | Status: DC
Start: 2023-05-04 — End: 2023-09-18

## 2023-05-04 NOTE — Telephone Encounter (Signed)
error 

## 2023-05-04 NOTE — Addendum Note (Signed)
Addended by: Kathreen Devoid on: 05/04/2023 09:08 AM   Modules accepted: Orders

## 2023-05-04 NOTE — Telephone Encounter (Signed)
Pt's mother calling requesting  metFORMIN (GLUCOPHAGE) 500 MG tablet amitriptyline (ELAVIL) 100 MG tablet clonazePAM (KLONOPIN) 2 MG tablet tiZANidine (ZANAFLEX) 4 MG tablet  traMADol (ULTRAM) 50 MG tablet  Says patient will run out of meds this weekend

## 2023-05-04 NOTE — Telephone Encounter (Signed)
Needs appt

## 2023-06-25 ENCOUNTER — Other Ambulatory Visit: Payer: Self-pay | Admitting: Family Medicine

## 2023-06-25 DIAGNOSIS — G894 Chronic pain syndrome: Secondary | ICD-10-CM

## 2023-06-25 DIAGNOSIS — M1990 Unspecified osteoarthritis, unspecified site: Secondary | ICD-10-CM

## 2023-07-10 ENCOUNTER — Other Ambulatory Visit: Payer: Self-pay | Admitting: Family Medicine

## 2023-07-10 DIAGNOSIS — F419 Anxiety disorder, unspecified: Secondary | ICD-10-CM

## 2023-07-19 ENCOUNTER — Telehealth: Payer: Self-pay | Admitting: Family Medicine

## 2023-07-19 NOTE — Telephone Encounter (Signed)
Pt's wife, Eather Colas, is requesting a call to talk about pt's prescriptions. She states that they use the [facility] at [facility], however the pharmacy states they do not have pt's prescriptions. She states pt is almost out of his prescriptions. She was not able to tell me which prescriptions specifically, she just said all of his prescriptions. Call back number: [phone]

## 2023-07-20 NOTE — Telephone Encounter (Signed)
Needs an appt

## 2023-07-20 NOTE — Telephone Encounter (Signed)
Left vm for Matthew [name] to sch pt for appt

## 2023-07-23 NOTE — Telephone Encounter (Signed)
Left vm for Delores Haroon to sch pt for appt

## 2023-07-24 ENCOUNTER — Other Ambulatory Visit: Payer: Self-pay | Admitting: Family Medicine

## 2023-07-24 ENCOUNTER — Telehealth: Payer: Self-pay | Admitting: Family Medicine

## 2023-07-24 DIAGNOSIS — G894 Chronic pain syndrome: Secondary | ICD-10-CM

## 2023-07-24 DIAGNOSIS — F419 Anxiety disorder, unspecified: Secondary | ICD-10-CM

## 2023-07-24 DIAGNOSIS — M1990 Unspecified osteoarthritis, unspecified site: Secondary | ICD-10-CM

## 2023-07-24 NOTE — Telephone Encounter (Signed)
LVM for Pt to call us back to schedule OV.

## 2023-07-24 NOTE — Telephone Encounter (Signed)
Spouse called to say the pharmacy   [facility] - [address] [place]), [place] - [address] Phone: [phone]  Fax: [phone]     Told her the Rx for Ozempic was denied, and they would like to know why?

## 2023-07-24 NOTE — Telephone Encounter (Signed)
Left vm for Delores Oxendine to sch pt for appt

## 2023-07-24 NOTE — Telephone Encounter (Signed)
Needs an appointment, a message had already been left for them.

## 2023-08-06 ENCOUNTER — Ambulatory Visit (INDEPENDENT_AMBULATORY_CARE_PROVIDER_SITE_OTHER): Payer: 59 | Admitting: Family Medicine

## 2023-08-06 ENCOUNTER — Telehealth: Payer: Self-pay

## 2023-08-06 ENCOUNTER — Encounter: Payer: Self-pay | Admitting: Family Medicine

## 2023-08-06 VITALS — BP 128/80 | HR 75 | Resp 16 | Ht 71.0 in | Wt 232.0 lb

## 2023-08-06 DIAGNOSIS — E785 Hyperlipidemia, unspecified: Secondary | ICD-10-CM

## 2023-08-06 DIAGNOSIS — E1169 Type 2 diabetes mellitus with other specified complication: Secondary | ICD-10-CM

## 2023-08-06 DIAGNOSIS — G894 Chronic pain syndrome: Secondary | ICD-10-CM

## 2023-08-06 DIAGNOSIS — Z794 Long term (current) use of insulin: Secondary | ICD-10-CM

## 2023-08-06 DIAGNOSIS — G4700 Insomnia, unspecified: Secondary | ICD-10-CM | POA: Diagnosis not present

## 2023-08-06 DIAGNOSIS — K219 Gastro-esophageal reflux disease without esophagitis: Secondary | ICD-10-CM

## 2023-08-06 DIAGNOSIS — N529 Male erectile dysfunction, unspecified: Secondary | ICD-10-CM

## 2023-08-06 DIAGNOSIS — F419 Anxiety disorder, unspecified: Secondary | ICD-10-CM | POA: Diagnosis not present

## 2023-08-06 DIAGNOSIS — Z7985 Long-term (current) use of injectable non-insulin antidiabetic drugs: Secondary | ICD-10-CM

## 2023-08-06 DIAGNOSIS — Z7984 Long term (current) use of oral hypoglycemic drugs: Secondary | ICD-10-CM

## 2023-08-06 LAB — POCT GLYCOSYLATED HEMOGLOBIN (HGB A1C): Hemoglobin A1C: 5.5 % (ref 4.0–5.6)

## 2023-08-06 LAB — LIPID PANEL
Cholesterol: 108 mg/dL (ref 0–200)
HDL: 50.6 mg/dL (ref >39.00)
LDL Cholesterol: 34 mg/dL (ref 0–99)
NonHDL: 57.53
Total CHOL/HDL Ratio: 2
Triglycerides: 119 mg/dL (ref 0.0–149.0)
VLDL: 23.8 mg/dL (ref 0.0–40.0)

## 2023-08-06 LAB — COMPREHENSIVE METABOLIC PANEL
ALT: 21 U/L (ref 0–53)
AST: 21 U/L (ref 0–37)
Albumin: 4.4 g/dL (ref 3.5–5.2)
Alkaline Phosphatase: 56 U/L (ref 39–117)
BUN: 15 mg/dL (ref 6–23)
CO2: 27 meq/L (ref 19–32)
Calcium: 8.9 mg/dL (ref 8.4–10.5)
Chloride: 105 meq/L (ref 96–112)
Creatinine, Ser: 0.86 mg/dL (ref 0.40–1.50)
GFR: 94.97 mL/min (ref >60.00)
Glucose, Bld: 112 mg/dL — ABNORMAL HIGH (ref 70–99)
Potassium: 4.2 meq/L (ref 3.5–5.1)
Sodium: 138 meq/L (ref 135–145)
Total Bilirubin: 0.5 mg/dL (ref 0.2–1.2)
Total Protein: 6.9 g/dL (ref 6.0–8.3)

## 2023-08-06 MED ORDER — SILDENAFIL CITRATE 100 MG PO TABS
100.0000 mg | ORAL_TABLET | Freq: Every day | ORAL | 5 refills | Status: DC | PRN
Start: 2023-08-06 — End: 2024-05-23

## 2023-08-06 MED ORDER — AMITRIPTYLINE HCL 100 MG PO TABS: ORAL_TABLET | ORAL | 2 refills | Status: DC

## 2023-08-06 MED ORDER — METFORMIN HCL 500 MG PO TABS
500.0000 mg | ORAL_TABLET | Freq: Two times a day (BID) | ORAL | 2 refills | Status: DC
Start: 2023-08-06 — End: 2024-01-21

## 2023-08-06 MED ORDER — PIOGLITAZONE HCL 15 MG PO TABS
15.0000 mg | ORAL_TABLET | Freq: Every day | ORAL | 2 refills | Status: DC
Start: 2023-08-06 — End: 2024-01-21

## 2023-08-06 MED ORDER — CLONAZEPAM 2 MG PO TABS
2.0000 mg | ORAL_TABLET | Freq: Every evening | ORAL | 2 refills | Status: DC | PRN
Start: 2023-08-06 — End: 2023-09-07

## 2023-08-06 MED ORDER — OMEPRAZOLE 20 MG PO CPDR
DELAYED_RELEASE_CAPSULE | ORAL | 2 refills | Status: DC
Start: 2023-08-06 — End: 2023-09-07

## 2023-08-06 MED ORDER — SEMAGLUTIDE (1 MG/DOSE) 4 MG/3ML ~~LOC~~ SOPN: 1.0000 mg | PEN_INJECTOR | SUBCUTANEOUS | 2 refills | Status: DC

## 2023-08-06 MED ORDER — OZEMPIC (0.25 OR 0.5 MG/DOSE) 2 MG/3ML ~~LOC~~ SOPN
0.5000 mg | PEN_INJECTOR | SUBCUTANEOUS | 3 refills | Status: DC
Start: 2023-08-06 — End: 2023-12-05

## 2023-08-06 NOTE — Patient Instructions (Addendum)
A few things to remember from today's visit:  Type 2 diabetes mellitus with other specified complication, without long-term current use of insulin (HCC) - Plan: POC HgB A1c, Semaglutide,0.25 or 0.5MG /DOS, (OZEMPIC, 0.25 OR 0.5 MG/DOSE,) 2 MG/3ML SOPN, Comprehensive metabolic panel, metFORMIN (GLUCOPHAGE) 500 MG tablet, pioglitazone (ACTOS) 15 MG tablet  Insomnia, unspecified type - Plan: amitriptyline (ELAVIL) 100 MG tablet  Anxiety disorder, unspecified type - Plan: clonazePAM (KLONOPIN) 2 MG tablet  Chronic pain disorder  Hyperlipidemia associated with type 2 diabetes mellitus (HCC) - Plan: Comprehensive metabolic panel, Lipid panel  Erectile dysfunction, unspecified erectile dysfunction type - Plan: sildenafil (VIAGRA) 100 MG tablet  Gastroesophageal reflux disease without esophagitis - Plan: omeprazole (PRILOSEC) 20 MG capsule  Today we stopped Tramadol, continue Meloxicam for pain. Stop [place].  If you need refills for medications you take chronically, please call your pharmacy. Do not use My Chart to request refills or for acute issues that need immediate attention. If you send a my chart message, it may take a few days to be addressed, specially if I am not in the office.  Please be sure medication list is accurate. If a new problem present, please set up appointment sooner than planned today.

## 2023-08-06 NOTE — Telephone Encounter (Signed)
Copied from CRM [id number]. Topic: Clinical - Medication Question >> Aug 06, 2023  9:28 AM Marica Otter wrote: Reason for CRM: Pharmacy is calling with questions regarding Ozempic prescribed 08/06/2023 states prescription states take 0.25 0.5 then 1mg  only received prescriptions for 0.5 and 0.25. 1mg  needs prescription for separate pen.  [facility] [address] [place], [place], [place] [address] [phone] Fax

## 2023-08-06 NOTE — Assessment & Plan Note (Signed)
He has not been on tramadol for a few months and he is not interested in resuming it. Pain seems to be adequately controlled just with meloxicam 15 mg daily, so no changes. We have discussed some side effects of chronic NSAID's use. Follow-up in 6 months, before if needed.

## 2023-08-06 NOTE — Assessment & Plan Note (Signed)
Problem is adequately controlled while taking PPI. Continue omeprazole 20 mg daily as needed and GERD precautions.

## 2023-08-06 NOTE — Assessment & Plan Note (Signed)
HgA1C is at goal, it went from 6.1 to 5.5. States that he has not been on Ozempic for a couple months, he would like to resume medication. Continue metformin 500 mg twice daily and Actos 50 mg daily, the latter one he is not sure if he is still taking. Stop [place]. Annual eye exam, periodic dental and foot care recommended. F/U in 5-6 months.

## 2023-08-06 NOTE — Assessment & Plan Note (Signed)
Problem has not been well-controlled since he ran out of amitriptyline and clonazepam. Resume Amitriptyline 100 mg at bedtime and Clonazepam 2 mg at night prn. PDMP reviewed. F/U in 6 months.

## 2023-08-06 NOTE — Assessment & Plan Note (Signed)
Last LDL 97 in 03/2022. Continue rosuvastatin 20 mg daily and low-fat diet. Further recommendation will be given according to lipid panel result.

## 2023-08-06 NOTE — Progress Notes (Signed)
HPI: Matthew [name] is a 57 y.o. male with a PMHx significant for OSA, GERD, HLD, DM II, OA, ED, chronic pain, and anxiety/depression, who is here today for chronic disease management.  Last seen on 11/20/2022  Diabetes Mellitus II: Dx'ed about 8-9 years ago. - Checking BG at home: He has been checking his blood sugar regularly at home. The lowest his readings have been is 70, and the highest is 126.  - Medications: Currently on metformin 500 mg bid, Tresiba 10 units daily, Actos 15 mg daily, and Ozempic 0.25 mg weekly. He hasn't been able to get his Ozempic prescription for a couple months. Denies nausea or abdominal pain when he was on ozempic.  - Exercise: He says he is active at work, and has been walking occasionally.  - eye exam: Last year.  - foot exam: Current.  - Negative for symptoms of hypoglycemia, polyuria, polydipsia, numbness, tingling, or burning of extremities, foot ulcers/trauma  Lab Results  Component Value Date   HGBA1C 6.1 11/20/2022   Lab Results  Component Value Date   MICROALBUR 1.3 11/20/2022   Hyperlipidemia: Currently on rosuvastatin 20 mg daily.  Side effects from medication: none Lab Results  Component Value Date   CHOL 186 03/14/2022   HDL 42.00 03/14/2022   LDLCALC 55 11/22/2020   LDLDIRECT 97.0 03/14/2022   TRIG 261.0 (H) 03/14/2022   CHOLHDL 4 03/14/2022   Anxiety:  Currently on clonazepam 2 mg daily prn. He says he hasn't taken it for about a month.   Chronic pain:  Currently on tramadol 50 mg bid prn and meloxicam 15 mg daily for chronic pain. He says he hasn't taken tramadol for about a month.   Insomnia:  Currently on amitriptyline 100 mg daily. Has not taking medication for a few weeks, has not been filled by pharmacy. He says he is only sleeping about 3 hours per night.   ED:  Currently on sildenafil 100 mg ever other day. He denies headaches or other side effects.  Requesting refills, take it every other day.  GERD:   Currently on omeprazole 20 mg. He is not taking it daily right now. It does help with heartburn.  He has lost 12 lbs since his last visit.   -He mentions he has had a cold for 2 weeks, and endorses some associated rhinorrhea and cough. Symptoms have improved.  Review of Systems  Constitutional:  Negative for activity change, appetite change, chills and fever.  HENT:  Positive for postnasal drip. Negative for sore throat and trouble swallowing.   Respiratory:  Negative for wheezing.   Gastrointestinal:  Negative for abdominal pain, nausea and vomiting.  Endocrine: Negative for cold intolerance and heat intolerance.  Genitourinary:  Negative for decreased urine volume, dysuria and hematuria.  Skin:  Negative for rash.  Neurological:  Negative for syncope and facial asymmetry.  Psychiatric/Behavioral:  Positive for sleep disturbance. Negative for confusion and hallucinations.   See other pertinent positives and negatives in HPI.  Current Outpatient Medications on File Prior to Visit  Medication Sig Dispense Refill   amitriptyline (ELAVIL) 100 MG tablet TAKE 1 TABLET BY MOUTH EVERYDAY AT BEDTIME 90 tablet 1   aspirin 81 MG tablet Take 81 mg by mouth daily.     Blood Glucose Monitoring Suppl ([employer] BLOOD GLUCOSE METER) w/Device KIT Use to check blood sugars daily. 1 kit 0   clonazePAM (KLONOPIN) 2 MG tablet TAKE 1 TABLET BY MOUTH ONCE DAILY AT BEDTIME AS NEEDED FOR ANXIETY  30 tablet 2   glucose blood (ONETOUCH ULTRA) test strip Use to check blood sugars 1-2 times daily. 100 each 12   insulin degludec (TRESIBA FLEXTOUCH) 100 UNIT/ML FlexTouch Pen Inject 10 Units into the skin daily. 15 mL 1   Insulin Pen Needle 32G X 4 MM MISC To use with insulin. 100 each 3   meloxicam (MOBIC) 15 MG tablet Take 1 tablet (15 mg total) by mouth daily. Due for follow up 90 tablet 0   metFORMIN (GLUCOPHAGE) 500 MG tablet Take 1 tablet (500 mg total) by mouth 2 (two) times daily with a meal. 180 tablet 1    omeprazole (PRILOSEC) 20 MG capsule TAKE 1 CAPSULE BY MOUTH EVERY DAY 90 capsule 1   OneTouch UltraSoft 2 Lancets MISC 1 each by Does not apply route daily as needed. Use to check blood sugars 1-2 times daily. 100 each 3   pioglitazone (ACTOS) 15 MG tablet TAKE 1 TABLET (15 MG TOTAL) BY MOUTH DAILY. 90 tablet 1   rosuvastatin (CRESTOR) 20 MG tablet TAKE 1 TABLET BY MOUTH EVERY DAY 90 tablet 2   Semaglutide,0.25 or 0.5MG /DOS, (OZEMPIC, 0.25 OR 0.5 MG/DOSE,) 2 MG/3ML SOPN Inject 0.5 mg into the skin once a week. 0.25 mg weekly for 3 weeks then 0.5 mg weekly for 3 weeks then 1 mg weekly for 3 weeks. 6 mL 2   sildenafil (VIAGRA) 100 MG tablet TAKE 1 TABLET BY MOUTH ONCE DAILY AS NEEDED FOR ERECTILE DYSFUNCTION 20 tablet 5   tiZANidine (ZANAFLEX) 4 MG tablet TAKE 1 TABLET BY MOUTH DAILY AS NEEDED FOR MUSCLE SPASMS. 90 tablet 1   traMADol (ULTRAM) 50 MG tablet Take 1 tablet (50 mg total) by mouth every 12 (twelve) hours as needed. 60 tablet 3   No current facility-administered medications on file prior to visit.    Past Medical History:  Diagnosis Date   Anxiety    Depression    Diabetes mellitus    GERD (gastroesophageal reflux disease)    H/O hiatal hernia    ?   Mental disorder    OSA (obstructive sleep apnea)    Not wearing CPAP   Parotid mass    rt   Sleep apnea    just started on cpap 2 weeks   Stones in the urinary tract    No Known Allergies  Social History   Socioeconomic History   Marital status: Single    Spouse name: Not on file   Number of children: Not on file   Years of education: Not on file   Highest education level: Not on file  Occupational History   Not on file  Tobacco Use   Smoking status: Former    Current packs/day: 0.00    Average packs/day: 0.3 packs/day for 5.0 years (1.3 ttl pk-yrs)    Types: Cigarettes    Start date: 05/29/2011    Quit date: 05/28/2016    Years since quitting: 7.1   Smokeless tobacco: Former    Types: Chew   Tobacco comments:     marijuana 06/26/12  occ social drinker  Vaping Use   Vaping status: Never Used  Substance and Sexual Activity   Alcohol use: Yes    Comment: monthly   Drug use: Yes    Frequency: 1.0 times per week    Types: Marijuana    Comment: denies recent use   Sexual activity: Not on file  Other Topics Concern   Not on file  Social History Narrative   Not  on file   Social Drivers of Health   Financial Resource Strain: Not on file  Food Insecurity: Not on file  Transportation Needs: Not on file  Physical Activity: Not on file  Stress: Not on file  Social Connections: Not on file   Today's Vitals   08/06/23 0738  BP: 128/80  Pulse: 75  Resp: 16  Weight: 232 lb (105.2 kg)  Height: 5\' 11"  (1.803 m)   Body mass index is 32.36 kg/m.  Physical Exam Vitals and nursing note reviewed.  Constitutional:      General: He is not in acute distress.    Appearance: He is well-developed.  HENT:     Head: Normocephalic and atraumatic.     Mouth/Throat:     Mouth: Mucous membranes are moist.     Pharynx: Uvula midline. Postnasal drip present.  Eyes:     Conjunctiva/sclera: Conjunctivae normal.  Cardiovascular:     Rate and Rhythm: Normal rate and regular rhythm.     Pulses:          Dorsalis pedis pulses are 2+ on the right side and 2+ on the left side.     Heart sounds: No murmur heard. Pulmonary:     Effort: Pulmonary effort is normal. No respiratory distress.     Breath sounds: Normal breath sounds.  Abdominal:     Palpations: Abdomen is soft. There is no hepatomegaly or mass.     Tenderness: There is no abdominal tenderness.  Musculoskeletal:     Right lower leg: No edema.     Left lower leg: No edema.  Lymphadenopathy:     Cervical: No cervical adenopathy.  Skin:    General: Skin is warm.     Findings: No erythema or rash.  Neurological:     Mental Status: He is alert and oriented to person, place, and time.     Cranial Nerves: No cranial nerve deficit.     Gait: Gait  normal.  Psychiatric:        Mood and Affect: Mood and affect normal.   ASSESSMENT AND PLAN:  Matthew [name] was seen today for chronic disease management.   Lab Results  Component Value Date   HGBA1C 5.5 08/06/2023   Lab Results  Component Value Date   CHOL 108 08/06/2023   HDL 50.60 08/06/2023   LDLCALC 34 08/06/2023   LDLDIRECT 97.0 03/14/2022   TRIG 119.0 08/06/2023   CHOLHDL 2 08/06/2023   Lab Results  Component Value Date   NA 138 08/06/2023   CL 105 08/06/2023   K 4.2 08/06/2023   CO2 27 08/06/2023   BUN 15 08/06/2023   CREATININE 0.86 08/06/2023   GFR 94.97 08/06/2023   CALCIUM 8.9 08/06/2023   ALBUMIN 4.4 08/06/2023   GLUCOSE 112 (H) 08/06/2023   Lab Results  Component Value Date   ALT 21 08/06/2023   AST 21 08/06/2023   ALKPHOS 56 08/06/2023   BILITOT 0.5 08/06/2023   Type 2 diabetes mellitus with other specified complication, without long-term current use of insulin (HCC) Assessment & Plan: HgA1C is at goal, it went from 6.1 to 5.5. States that he has not been on Ozempic for a couple months, he would like to resume medication. Continue metformin 500 mg twice daily and Actos 50 mg daily, the latter one he is not sure if he is still taking. Stop [place]. Annual eye exam, periodic dental and foot care recommended. F/U in 5-6 months.  Orders: -  POCT glycosylated hemoglobin (Hb A1C) -     Ozempic (0.25 or 0.5 MG/DOSE); Inject 0.5 mg into the skin once a week. 0.25 mg weekly for 3 weeks then 0.5 mg weekly for 3 weeks then 1 mg weekly for 3 weeks.  Dispense: 6 mL; Refill: 3 -     Comprehensive metabolic panel; Future -     metFORMIN HCl; Take 1 tablet (500 mg total) by mouth 2 (two) times daily with a meal.  Dispense: 180 tablet; Refill: 2 -     Pioglitazone HCl; Take 1 tablet (15 mg total) by mouth daily.  Dispense: 90 tablet; Refill: 2  Insomnia, unspecified type Assessment & Plan: Problem has not been well-controlled since he ran out of amitriptyline  and clonazepam. Resume Amitriptyline 100 mg at bedtime and Clonazepam 2 mg at night prn. PDMP reviewed. F/U in 6 months.  Orders: -     Amitriptyline HCl; TAKE 1 TABLET BY MOUTH EVERYDAY AT BEDTIME  Dispense: 90 tablet; Refill: 2  Anxiety disorder, unspecified type Assessment & Plan: Otherwise stable but has not been sleeping well since he is not taking Amitriptyline 100 mg and Clonazepam 2 mg. Both medications to be resumed at same dose. F/U in 6 months, before if needed.  Orders: -     clonazePAM; Take 1 tablet (2 mg total) by mouth at bedtime as needed for anxiety.  Dispense: 30 tablet; Refill: 2  Chronic pain disorder Assessment & Plan: He has not been on tramadol for a few months and he is not interested in resuming it. Pain seems to be adequately controlled just with meloxicam 15 mg daily, so no changes. We have discussed some side effects of chronic NSAID's use. Follow-up in 6 months, before if needed.   Hyperlipidemia associated with type 2 diabetes mellitus (HCC) Assessment & Plan: Last LDL 97 in 03/2022. Continue rosuvastatin 20 mg daily and low-fat diet. Further recommendation will be given according to lipid panel result.  Orders: -     Comprehensive metabolic panel; Future -     Lipid panel; Future  Erectile dysfunction, unspecified erectile dysfunction type Assessment & Plan: Stable, Sildenafil 100 mg daily prn has helped, no changes. Some side effects discussed.  Orders: -     Sildenafil Citrate; Take 1 tablet (100 mg total) by mouth daily as needed for erectile dysfunction.  Dispense: 20 tablet; Refill: 5  Gastroesophageal reflux disease without esophagitis Assessment & Plan: Problem is adequately controlled while taking PPI. Continue omeprazole 20 mg daily as needed and GERD precautions.  Orders: -     Omeprazole; TAKE 1 CAPSULE BY MOUTH EVERY DAY  Dispense: 90 capsule; Refill: 2  Return in about 6 months (around 02/04/2024) for chronic  problems.  I, Suanne Marker, acting as a scribe for Lydiann Bonifas [place], MD., have documented all relevant documentation on the behalf of Matthew Hentges [place], MD, as directed by  Matthew Antu [place], MD while in the presence of Matthew Aicher [place], MD.   I, Suanne Marker, have reviewed all documentation for this visit. The documentation on 08/06/23 for the exam, diagnosis, procedures, and orders are all accurate and complete.  Matthew Stickels G. [place], MD  [facility]. [place] office.

## 2023-08-09 ENCOUNTER — Telehealth: Payer: Self-pay

## 2023-08-09 NOTE — Assessment & Plan Note (Signed)
Otherwise stable but has not been sleeping well since he is not taking Amitriptyline 100 mg and Clonazepam 2 mg. Both medications to be resumed at same dose. F/U in 6 months, before if needed.

## 2023-08-09 NOTE — Assessment & Plan Note (Signed)
Stable, Sildenafil 100 mg daily prn has helped, no changes. Some side effects discussed.

## 2023-08-09 NOTE — Telephone Encounter (Unsigned)
Copied from CRM [id number]. Topic: Clinical - Prescription Issue >> Aug 09, 2023 10:24 AM Dimitri Ped wrote: Reason for CRM: patient mother is calling about the diabetes shot insulin tresiba. Patient was still thinking he was to get that medication please give patient a call [phone]

## 2023-08-13 NOTE — Telephone Encounter (Signed)
Unable to reach family member as mother's voicemail was full.  Left a message at the patient's cell number to return my call.

## 2023-08-20 NOTE — Telephone Encounter (Signed)
 I left patient a voicemail to return my call. Per OV note, Matthew [name] was stopped.

## 2023-08-22 NOTE — Telephone Encounter (Signed)
 I left patient a voicemail to return my call. Per OV note, Matthew [name] was stopped.

## 2023-08-24 NOTE — Telephone Encounter (Signed)
 I left patient a voicemail to return my call. Per OV note, Matthew [name] was stopped.

## 2023-08-24 NOTE — Telephone Encounter (Signed)
Pt has not returned call, closing encounter

## 2023-09-06 ENCOUNTER — Encounter: Payer: Self-pay | Admitting: Family Medicine

## 2023-09-06 DIAGNOSIS — E1169 Type 2 diabetes mellitus with other specified complication: Secondary | ICD-10-CM

## 2023-09-06 DIAGNOSIS — K219 Gastro-esophageal reflux disease without esophagitis: Secondary | ICD-10-CM

## 2023-09-06 DIAGNOSIS — G894 Chronic pain syndrome: Secondary | ICD-10-CM

## 2023-09-06 DIAGNOSIS — M1990 Unspecified osteoarthritis, unspecified site: Secondary | ICD-10-CM

## 2023-09-06 DIAGNOSIS — F419 Anxiety disorder, unspecified: Secondary | ICD-10-CM

## 2023-09-07 MED ORDER — CLONAZEPAM 2 MG PO TABS
2.0000 mg | ORAL_TABLET | Freq: Every evening | ORAL | 2 refills | Status: DC | PRN
Start: 2023-09-07 — End: 2023-11-22

## 2023-09-07 MED ORDER — MELOXICAM 15 MG PO TABS
15.0000 mg | ORAL_TABLET | Freq: Every day | ORAL | 2 refills | Status: DC
Start: 2023-09-07 — End: 2024-02-27

## 2023-09-07 MED ORDER — OMEPRAZOLE 20 MG PO CPDR
DELAYED_RELEASE_CAPSULE | ORAL | 2 refills | Status: DC
Start: 2023-09-07 — End: 2024-05-05

## 2023-09-07 MED ORDER — ROSUVASTATIN CALCIUM 20 MG PO TABS
20.0000 mg | ORAL_TABLET | Freq: Every day | ORAL | 2 refills | Status: AC
Start: 2023-09-07 — End: ?

## 2023-09-07 NOTE — Telephone Encounter (Signed)
It seems like there are 2 refills left from old Rx in 07/2023. Thanks, BJ

## 2023-09-18 ENCOUNTER — Other Ambulatory Visit: Payer: Self-pay

## 2023-09-18 DIAGNOSIS — G8929 Other chronic pain: Secondary | ICD-10-CM

## 2023-09-18 MED ORDER — TIZANIDINE HCL 4 MG PO TABS
ORAL_TABLET | ORAL | 1 refills | Status: DC
Start: 2023-09-18 — End: 2024-02-11

## 2023-11-22 ENCOUNTER — Other Ambulatory Visit: Payer: Self-pay | Admitting: Family Medicine

## 2023-11-22 DIAGNOSIS — F419 Anxiety disorder, unspecified: Secondary | ICD-10-CM

## 2023-12-05 ENCOUNTER — Other Ambulatory Visit: Payer: Self-pay | Admitting: Family Medicine

## 2024-01-18 NOTE — Progress Notes (Unsigned)
 HPI: [name] is a 58 y.o. male with a PMHx significant for OSA, GERD, HLD, DM II, OA, chronic pain, and anxiety here today for chronic disease management.  Last seen on 08/06/2023  Diabetes Mellitus II:  Dx'ed about 9 years ago.  - Checking BG at home: Patient has been checking his blood sugars at home and says they have been 100s-120s.  - Medications: Currently on Ozempic  1 mg weekly, Actos  15 mg daily, and Metformin  500 mg twice daily.  - eye exam: He is due for an eye exam - foot exam: performed in 07/2023 - Negative for symptoms of hypoglycemia, polyuria, polydipsia, numbness extremities, foot ulcers/trauma  Lab Results  Component Value Date   HGBA1C 5.5 08/06/2023   Lab Results  Component Value Date   MICROALBUR 1.3 11/20/2022   Chronic pain:  Currently on meloxicam  15 mg daily.  He takes medication daily, has not noted side effects.  Insomnia;  Currently on amitriptyline  100 mg at bedtime and clonazepam  2 mg at bedtime. He ran out of amitriptyline  a few weeks ago after trying doubling his dose.  He reports he is still having significant difficulty sleeping, and gets 2 hours if he is lucky.  Some nights he doesn't sleep at all.  Taking amitriptyline  200 mg did not help. He has been having problems with increased insomnia for about 2-3 months.  Denies symptoms of sleep apnea or depressive symptoms.   Alcohol : Denies drinking for about 6 months.   Review of Systems  Constitutional:  Positive for fatigue. Negative for activity change, appetite change and fever.  HENT:  Negative for mouth sores and sore throat.   Eyes:  Negative for redness and visual disturbance.  Respiratory:  Negative for cough, shortness of breath and wheezing.   Cardiovascular:  Negative for chest pain, palpitations and leg swelling.  Gastrointestinal:  Negative for abdominal pain, nausea and vomiting.  Endocrine: Negative for cold intolerance and heat intolerance.  Genitourinary:   Negative for decreased urine volume, dysuria and hematuria.  Skin:  Negative for rash.  Neurological:  Negative for syncope, weakness and headaches.  Psychiatric/Behavioral:  Negative for confusion and hallucinations.   See other pertinent positives and negatives in HPI.  Current Outpatient Medications on File Prior to Visit  Medication Sig Dispense Refill   aspirin 81 MG tablet Take 81 mg by mouth daily.     Blood Glucose Monitoring Suppl ([employer] BLOOD GLUCOSE METER) w/Device KIT Use to check blood sugars daily. 1 kit 0   clonazePAM  (KLONOPIN ) 2 MG tablet TAKE 1 TABLET BY MOUTH AT BEDTIME AS NEEDED FOR ANXIETY 30 tablet 3   glucose blood (ONETOUCH ULTRA) test strip Use to check blood sugars 1-2 times daily. 100 each 12   Insulin  Pen Needle 32G X 4 MM MISC To use with insulin . 100 each 3   meloxicam  (MOBIC ) 15 MG tablet Take 1 tablet (15 mg total) by mouth daily. 90 tablet 2   omeprazole  (PRILOSEC) 20 MG capsule TAKE 1 CAPSULE BY MOUTH EVERY DAY 90 capsule 2   OneTouch UltraSoft 2 Lancets MISC 1 each by Does not apply route daily as needed. Use to check blood sugars 1-2 times daily. 100 each 3   rosuvastatin  (CRESTOR ) 20 MG tablet Take 1 tablet (20 mg total) by mouth daily. 90 tablet 2   sildenafil  (VIAGRA ) 100 MG tablet Take 1 tablet (100 mg total) by mouth daily as needed for erectile dysfunction. 20 tablet 5   tiZANidine  (ZANAFLEX ) 4 MG  tablet TAKE 1 TABLET BY MOUTH DAILY AS NEEDED FOR MUSCLE SPASMS. 90 tablet 1   No current facility-administered medications on file prior to visit.    Past Medical History:  Diagnosis Date   Anxiety    Depression    Diabetes mellitus    GERD (gastroesophageal reflux disease)    H/O hiatal hernia    ?   Mental disorder    OSA (obstructive sleep apnea)    Not wearing CPAP   Parotid mass    rt   Sleep apnea    just started on cpap 2 weeks   Stones in the urinary tract    No Known Allergies  Social History   Socioeconomic History   Marital  status: Single    Spouse name: Not on file   Number of children: Not on file   Years of education: Not on file   Highest education level: Not on file  Occupational History   Not on file  Tobacco Use   Smoking status: Former    Current packs/day: 0.00    Average packs/day: 0.3 packs/day for 5.0 years (1.3 ttl pk-yrs)    Types: Cigarettes    Start date: 05/29/2011    Quit date: 05/28/2016    Years since quitting: 7.6   Smokeless tobacco: Former    Types: Chew   Tobacco comments:    marijuana 06/26/12  occ social drinker  Vaping Use   Vaping status: Never Used  Substance and Sexual Activity   Alcohol  use: Yes    Comment: monthly   Drug use: Yes    Frequency: 1.0 times per week    Types: Marijuana    Comment: denies recent use   Sexual activity: Not on file  Other Topics Concern   Not on file  Social History Narrative   Not on file   Social Drivers of Health   Financial Resource Strain: Not on file  Food Insecurity: Not on file  Transportation Needs: Not on file  Physical Activity: Not on file  Stress: Not on file  Social Connections: Not on file   Vitals:   01/21/24 0829  BP: 120/70  Pulse: 82  Resp: 16  Temp: 98.3 F (36.8 C)  SpO2: 96%   Body mass index is 31.15 kg/m.  Physical Exam Vitals and nursing note reviewed.  Constitutional:      General: He is not in acute distress.    Appearance: He is well-developed.  HENT:     Head: Normocephalic and atraumatic.     Mouth/Throat:     Mouth: Mucous membranes are moist.     Pharynx: Oropharynx is clear. Uvula midline.  Eyes:     Conjunctiva/sclera: Conjunctivae normal.  Cardiovascular:     Rate and Rhythm: Normal rate and regular rhythm.     Pulses:          Dorsalis pedis pulses are 2+ on the right side and 2+ on the left side.     Heart sounds: No murmur heard. Pulmonary:     Effort: Pulmonary effort is normal. No respiratory distress.     Breath sounds: Normal breath sounds.  Abdominal:      Palpations: Abdomen is soft. There is no hepatomegaly or mass.     Tenderness: There is no abdominal tenderness.  Musculoskeletal:     Right lower leg: No edema.     Left lower leg: No edema.  Lymphadenopathy:     Cervical: No cervical adenopathy.  Skin:  General: Skin is warm.     Findings: No erythema or rash.  Neurological:     Mental Status: He is alert and oriented to person, place, and time.     Cranial Nerves: No cranial nerve deficit.     Gait: Gait normal.  Psychiatric:        Mood and Affect: Mood and affect normal.   ASSESSMENT AND PLAN:  Mr. Severe was seen today for chronic disease management.   Orders Placed This Encounter  Procedures   Microalbumin / creatinine urine ratio   Basic metabolic panel with GFR   TSH   Microalbumin/Creatinine Ratio, Urine   POC HgB A1c   Lab Results  Component Value Date   HGBA1C 5.5 01/21/2024   Type 2 diabetes mellitus with other specified complication, without long-term current use of insulin  (HCC) Assessment & Plan: HgA1C is at goal, stable at 5.5. Actos  15 mg discontinued. Continue metformin  500 mg twice daily and Ozempic  1 mg weekly. He is overdue for his eye exam, recommend arranging appt with his eye care provider. Continue periodic dental and foot care. F/U in 5 months.  Orders: -     POCT glycosylated hemoglobin (Hb A1C) -     Microalbumin / creatinine urine ratio; Future -     Basic metabolic panel with GFR; Future -     metFORMIN  HCl; Take 1 tablet (500 mg total) by mouth 2 (two) times daily with a meal.  Dispense: 180 tablet; Refill: 2 -     Ozempic  (1 MG/DOSE); Inject 1 mg into the skin once a week.  Dispense: 6 mL; Refill: 3 -     Microalbumin / creatinine urine ratio; Future  Insomnia, unspecified type Assessment & Plan: Problem is not well controlled, worse for the past 2-3 months, sleeping from 0-2-3 hours max. We discussed possible etiologies, could be associated with anxiety. He has not been on  Amitriptyline  for 7-10 days because ran out , he was taking 200 mg and still did not help, so do not resume. Other treatment options discussed, he agrees with trying Trazodone, starting 50 mg daily at bedtime and titrate up to 150 mg if needed for sleep. Good sleep hygiene also recommended.  Orders: -     TSH; Future -     traZODone HCl; Take 1-2 tablets (50-100 mg total) by mouth at bedtime.  Dispense: 90 tablet; Refill: 2  Osteoarthritis of multiple joints, unspecified osteoarthritis type Assessment & Plan: Problem is stable. Meloxicam  15 mg daily to continue.   Anxiety disorder, unspecified type Assessment & Plan: Problem is not well controlled, could be aggravating insomnia. He feels like Clonazepam  2 mg daily helps and would like to continue it. Trazodone added today to help with insomnia. May recommend establishing with psychiatrist if insomnia/anxiety are not improved.    Return in about 5 months (around 06/22/2024) for chronic problems.  I, Fritz Jewel Wierda, acting as a scribe for Kharizma Lesnick [place], MD., have documented all relevant documentation on the behalf of Tremell Reimers [place], MD, as directed by  Jeanmarc Viernes [place], MD while in the presence of Cherell Colvin [place], MD.   I, Rudean Icenhour [place], MD, have reviewed all documentation for this visit. The documentation on 01/21/24 for the exam, diagnosis, procedures, and orders are all accurate and complete.  Aluel Schwarz G. [place], MD  [facility]. [place] office.

## 2024-01-21 ENCOUNTER — Encounter: Payer: Self-pay | Admitting: Family Medicine

## 2024-01-21 ENCOUNTER — Ambulatory Visit: Admitting: Family Medicine

## 2024-01-21 VITALS — BP 120/70 | HR 82 | Temp 98.3°F | Resp 16 | Ht 71.0 in | Wt 223.4 lb

## 2024-01-21 DIAGNOSIS — Z7985 Long-term (current) use of injectable non-insulin antidiabetic drugs: Secondary | ICD-10-CM

## 2024-01-21 DIAGNOSIS — F419 Anxiety disorder, unspecified: Secondary | ICD-10-CM

## 2024-01-21 DIAGNOSIS — G4700 Insomnia, unspecified: Secondary | ICD-10-CM

## 2024-01-21 DIAGNOSIS — E1169 Type 2 diabetes mellitus with other specified complication: Secondary | ICD-10-CM

## 2024-01-21 DIAGNOSIS — M159 Polyosteoarthritis, unspecified: Secondary | ICD-10-CM

## 2024-01-21 DIAGNOSIS — Z7984 Long term (current) use of oral hypoglycemic drugs: Secondary | ICD-10-CM

## 2024-01-21 LAB — POCT GLYCOSYLATED HEMOGLOBIN (HGB A1C): Hemoglobin A1C: 5.5 % (ref 4.0–5.6)

## 2024-01-21 LAB — BASIC METABOLIC PANEL WITH GFR
BUN: 18 mg/dL (ref 6–23)
CO2: 29 meq/L (ref 19–32)
Calcium: 9.5 mg/dL (ref 8.4–10.5)
Chloride: 104 meq/L (ref 96–112)
Creatinine, Ser: 0.87 mg/dL (ref 0.40–1.50)
GFR: 94.33 mL/min (ref >60.00)
Glucose, Bld: 127 mg/dL — ABNORMAL HIGH (ref 70–99)
Potassium: 4.7 meq/L (ref 3.5–5.1)
Sodium: 140 meq/L (ref 135–145)

## 2024-01-21 MED ORDER — OZEMPIC (1 MG/DOSE) 4 MG/3ML ~~LOC~~ SOPN
1.0000 mg | PEN_INJECTOR | SUBCUTANEOUS | 3 refills | Status: DC
Start: 2024-01-21 — End: 2024-06-27

## 2024-01-21 MED ORDER — TRAZODONE HCL 50 MG PO TABS
50.0000 mg | ORAL_TABLET | Freq: Every day | ORAL | 2 refills | Status: DC
Start: 2024-01-21 — End: 2024-02-11

## 2024-01-21 MED ORDER — METFORMIN HCL 500 MG PO TABS: 500.0000 mg | ORAL_TABLET | Freq: Two times a day (BID) | ORAL | 2 refills | Status: DC

## 2024-01-21 MED ORDER — TRAZODONE HCL 50 MG PO TABS
50.0000 mg | ORAL_TABLET | Freq: Every evening | ORAL | 3 refills | Status: DC | PRN
Start: 2024-01-21 — End: 2024-01-21

## 2024-01-21 NOTE — Assessment & Plan Note (Signed)
 Problem is not well controlled, could be aggravating insomnia. He feels like Clonazepam  2 mg daily helps and would like to continue it. Trazodone added today to help with insomnia. May recommend establishing with psychiatrist if insomnia/anxiety are not improved.

## 2024-01-21 NOTE — Assessment & Plan Note (Signed)
 HgA1C is at goal, stable at 5.5. Actos  15 mg discontinued. Continue metformin  500 mg twice daily and Ozempic  1 mg weekly. He is overdue for his eye exam, recommend arranging appt with his eye care provider. Continue periodic dental and foot care. F/U in 5 months.

## 2024-01-21 NOTE — Patient Instructions (Addendum)
 A few things to remember from today's visit:  Type 2 diabetes mellitus with other specified complication, without long-term current use of insulin  (HCC) - Plan: POC HgB A1c, Microalbumin / creatinine urine ratio, Basic metabolic panel with GFR  Anxiety disorder, unspecified type  Insomnia, unspecified type - Plan: traZODone (DESYREL) 50 MG tablet, TSH Actos  stopped today because your diabetes is well controlled. Trazodone started today, start with a tab and can increase to 2 tab and 3 tabs if needed for sleep. Do not resume Amitriptyline . No changes in rest.  If you need refills for medications you take chronically, please call your pharmacy. Do not use My Chart to request refills or for acute issues that need immediate attention. If you send a my chart message, it may take a few days to be addressed, specially if I am not in the office.  Please be sure medication list is accurate. If a new problem present, please set up appointment sooner than planned today.

## 2024-01-21 NOTE — Assessment & Plan Note (Signed)
 Problem is not well controlled, worse for the past 2-3 months, sleeping from 0-2-3 hours max. We discussed possible etiologies, could be associated with anxiety. He has not been on Amitriptyline  for 7-10 days because ran out , he was taking 200 mg and still did not help, so do not resume. Other treatment options discussed, he agrees with trying Trazodone, starting 50 mg daily at bedtime and titrate up to 150 mg if needed for sleep. Good sleep hygiene also recommended.

## 2024-01-21 NOTE — Assessment & Plan Note (Addendum)
 Problem is stable. Meloxicam  15 mg daily to continue.

## 2024-01-24 LAB — TSH: TSH: 1.53 u[IU]/mL (ref 0.35–5.50)

## 2024-01-25 ENCOUNTER — Ambulatory Visit: Payer: Self-pay | Admitting: Family Medicine

## 2024-02-01 ENCOUNTER — Ambulatory Visit: Payer: Self-pay

## 2024-02-01 NOTE — Telephone Encounter (Signed)
 Noted

## 2024-02-01 NOTE — Telephone Encounter (Signed)
 FYI Only or Action Required?: FYI only for provider.  Patient was last seen in primary care on 01/21/2024 by [place], Betty G, MD. Called Nurse Triage reporting wound. Symptoms began several days ago. Interventions attempted: OTC medications: antibiotic ointment and Rest, hydration, or home remedies. Symptoms are: gradually worsening.  Triage Disposition: See Physician Within 24 Hours  Patient/caregiver understands and will follow disposition?: Yes, but will wait  Copied from CRM [id number]. Topic: Clinical - Red Word Triage >> Feb 01, 2024  9:17 AM Alyse July wrote: Red Word that prompted transfer to Nurse Triage: Swollen/Infected left arm(Previous cut), patient is also experiencing pain at the site   ----------------------------------------------------------------------- From previous Reason for Contact - Scheduling: Patient/patient representative is calling to schedule an appointment. Refer to attachments for appointment information. Reason for Disposition  [1] Looks infected (spreading redness, pus) AND [2] no fever  Answer Assessment - Initial Assessment Questions 1. APPEARANCE of INJURY: What does the injury look like?      Swollen red, seems to have puss, no drainage, warm to touch 2. SIZE: How large is the cut?     1.5-2.5 inches 3. BLEEDING: Is it bleeding now? If Yes, ask: Is it difficult to stop?      no 4. LOCATION: Where is the injury located?      Left arm near elbow 5. ONSET: How long ago did the injury occur?      One week ago  Additional info:  Declined available appointment today due to work schedule. Requested to be scheduled Monday morning as early as possible, scheduled with PCP Monday 9am, added to wait list for earlier appointment on Monday. 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 Agreeable to urgent care if symptoms progress, redness streaking away from wound, or develops systemic fever.  Protocols used: Cuts and Lacerations-A-AH

## 2024-02-04 ENCOUNTER — Encounter: Payer: Self-pay | Admitting: Family Medicine

## 2024-02-04 ENCOUNTER — Ambulatory Visit (INDEPENDENT_AMBULATORY_CARE_PROVIDER_SITE_OTHER): Admitting: Family Medicine

## 2024-02-04 VITALS — BP 120/70 | HR 78 | Temp 98.7°F | Resp 16 | Ht 71.0 in | Wt 221.0 lb

## 2024-02-04 DIAGNOSIS — L02414 Cutaneous abscess of left upper limb: Secondary | ICD-10-CM

## 2024-02-04 DIAGNOSIS — S59912A Unspecified injury of left forearm, initial encounter: Secondary | ICD-10-CM | POA: Diagnosis not present

## 2024-02-04 MED ORDER — SULFAMETHOXAZOLE-TRIMETHOPRIM 800-160 MG PO TABS: 1.0000 | ORAL_TABLET | Freq: Two times a day (BID) | ORAL | 0 refills | Status: AC

## 2024-02-04 MED ORDER — CEFTRIAXONE SODIUM 500 MG IJ SOLR
500.0000 mg | Freq: Once | INTRAMUSCULAR | Status: AC
  Administered 2024-02-04: 500 mg via INTRAMUSCULAR

## 2024-02-04 NOTE — Progress Notes (Signed)
 ACUTE VISIT Chief Complaint  Patient presents with   Wound Check    X 2 weeks ago, red   HPI: [name] is a 58 y.o. male with past medical history of hyperlipidemia, DM 2, and anxiety here today with his mother for evaluation of a wound.  He initially cut his left forearm accidentally about 2 weeks ago, but is uncertain how or with what he cut his arm. Since he first cut his arm, he has hit the area at least twice; once last Tuesday 6/17 at work and Last Thursday 6/19 against a truck.  He has since experienced occasional dizziness, drainage (blood and pus) from affected area, warmness to the touch, headache that lasted a few days, and sporadic burning sensation around wound.   He states that problem has overall improved.  Blood sugars are running fine.  Denies any fever, chills, changes in appetite, cough, arm numbness/tingling. He has tried OTC neosporin.  Review of Systems  Constitutional:  Positive for activity change.  HENT:  Negative for sore throat.   Respiratory:  Negative for shortness of breath.   Cardiovascular:  Negative for chest pain and leg swelling.  Gastrointestinal:  Negative for abdominal pain and nausea.  Genitourinary:  Negative for decreased urine volume, dysuria and hematuria.  Musculoskeletal:  Negative for gait problem and myalgias.  Neurological:  Negative for syncope, facial asymmetry and weakness.  See other pertinent positives and negatives in HPI.  Current Outpatient Medications on File Prior to Visit  Medication Sig Dispense Refill   aspirin 81 MG tablet Take 81 mg by mouth daily.     Blood Glucose Monitoring Suppl ([employer] BLOOD GLUCOSE METER) w/Device KIT Use to check blood sugars daily. 1 kit 0   clonazePAM  (KLONOPIN ) 2 MG tablet TAKE 1 TABLET BY MOUTH AT BEDTIME AS NEEDED FOR ANXIETY 30 tablet 3   glucose blood (ONETOUCH ULTRA) test strip Use to check blood sugars 1-2 times daily. 100 each 12   Insulin  Pen Needle 32G X 4 MM MISC To use  with insulin . 100 each 3   meloxicam  (MOBIC ) 15 MG tablet Take 1 tablet (15 mg total) by mouth daily. 90 tablet 2   metFORMIN  (GLUCOPHAGE ) 500 MG tablet Take 1 tablet (500 mg total) by mouth 2 (two) times daily with a meal. 180 tablet 2   omeprazole  (PRILOSEC) 20 MG capsule TAKE 1 CAPSULE BY MOUTH EVERY DAY 90 capsule 2   OneTouch UltraSoft 2 Lancets MISC 1 each by Does not apply route daily as needed. Use to check blood sugars 1-2 times daily. 100 each 3   rosuvastatin  (CRESTOR ) 20 MG tablet Take 1 tablet (20 mg total) by mouth daily. 90 tablet 2   Semaglutide , 1 MG/DOSE, (OZEMPIC , 1 MG/DOSE,) 4 MG/3ML SOPN Inject 1 mg into the skin once a week. 6 mL 3   sildenafil  (VIAGRA ) 100 MG tablet Take 1 tablet (100 mg total) by mouth daily as needed for erectile dysfunction. 20 tablet 5   tiZANidine  (ZANAFLEX ) 4 MG tablet TAKE 1 TABLET BY MOUTH DAILY AS NEEDED FOR MUSCLE SPASMS. 90 tablet 1   traZODone  (DESYREL ) 50 MG tablet Take 1-2 tablets (50-100 mg total) by mouth at bedtime. 90 tablet 2   No current facility-administered medications on file prior to visit.    Past Medical History:  Diagnosis Date   Anxiety    Depression    Diabetes mellitus    GERD (gastroesophageal reflux disease)    H/O hiatal hernia    ?  Mental disorder    OSA (obstructive sleep apnea)    Not wearing CPAP   Parotid mass    rt   Sleep apnea    just started on cpap 2 weeks   Stones in the urinary tract    No Known Allergies  Social History   Socioeconomic History   Marital status: Single    Spouse name: Not on file   Number of children: Not on file   Years of education: Not on file   Highest education level: Not on file  Occupational History   Not on file  Tobacco Use   Smoking status: Former    Current packs/day: 0.00    Average packs/day: 0.3 packs/day for 5.0 years (1.3 ttl pk-yrs)    Types: Cigarettes    Start date: 05/29/2011    Quit date: 05/28/2016    Years since quitting: 7.6   Smokeless  tobacco: Former    Types: Chew   Tobacco comments:    marijuana 06/26/12  occ social drinker  Vaping Use   Vaping status: Never Used  Substance and Sexual Activity   Alcohol  use: Yes    Comment: monthly   Drug use: Yes    Frequency: 1.0 times per week    Types: Marijuana    Comment: denies recent use   Sexual activity: Not on file  Other Topics Concern   Not on file  Social History Narrative   Not on file   Social Drivers of Health   Financial Resource Strain: Not on file  Food Insecurity: Not on file  Transportation Needs: Not on file  Physical Activity: Not on file  Stress: Not on file  Social Connections: Not on file   Vitals:   02/04/24 0851  BP: 120/70  Pulse: 78  Resp: 16  Temp: 98.7 F (37.1 C)  SpO2: 96%   Body mass index is 30.82 kg/m.  Physical Exam Vitals and nursing note reviewed.  Constitutional:      General: He is not in acute distress.    Appearance: He is well-developed.  HENT:     Head: Normocephalic and atraumatic.   Eyes:     Conjunctiva/sclera: Conjunctivae normal.    Cardiovascular:     Rate and Rhythm: Normal rate and regular rhythm.     Heart sounds: No murmur heard. Pulmonary:     Effort: Pulmonary effort is normal. No respiratory distress.     Breath sounds: Normal breath sounds.  Lymphadenopathy:     Cervical: No cervical adenopathy.   Skin:    General: Skin is warm.     Findings: Abscess and erythema present.         Comments: Erythematous raised area surrounded by induration and local heat middle of dorsal aspect of left forearm. Linear superficial excoriation that seems to be healing, crusty. Fluctuant small area with purulent drainage when pressure is applied.  See picture.   Neurological:     General: No focal deficit present.     Mental Status: He is alert and oriented to person, place, and time.     Gait: Gait normal.   Psychiatric:        Mood and Affect: Mood and affect normal.     ASSESSMENT AND  PLAN: Matthew [name] was seen today for a wound check.   Injury of left forearm, initial encounter About 2 weeks ago, he does not recall how this happened. Last Tdap 2016, because abx started today, will wait until next visit  for Tdap. I do not think imaging is needed at this time.  Abscess of forearm, left Due to localization, I do not feel comfortable I&D lesion, urgent consultation with surgeon requested. Here in the office after verbal consent, he received Rocephin 500 mg IM. Will continue Bactrim  DS twice daily. Warm compresses a few times throughout the day. Note for work provided. He was clearly instructed about warning signs. F/U in a week if not able to see surgeon at that time.  -     Sulfamethoxazole -Trimethoprim ; Take 1 tablet by mouth 2 (two) times daily for 5 days.  Dispense: 10 tablet; Refill: 0 -     Ambulatory referral to General Surgery -     cefTRIAXone Sodium  Return in about 1 week (around 02/11/2024), or if symptoms worsen or fail to improve, for with Dr Mercer (if any available)  if surgery cannot see him within the next few days.Matthew [name], acting as a scribe for Matthew Aldaco [place], MD., have documented all relevant documentation on the behalf of Matthew Adelson [place], MD, as directed by  while in the presence of Matthew Bodin [place], MD.  I, Matthew Atilano [place], MD, have reviewed all documentation for this visit. The documentation on 02/04/24 for the exam, diagnosis, procedures, and orders are all accurate and complete.  Matthew Posa G. [place], MD  [facility]. [place] office.

## 2024-02-04 NOTE — Patient Instructions (Addendum)
 A few things to remember from today's visit:  Abscess of forearm, left - Plan: sulfamethoxazole -trimethoprim  (BACTRIM  DS) 800-160 MG tablet, Ambulatory referral to General Surgery Here in the office Rocephin shot was given. Start oral antibiotic. Warm compresses a few times during the day. Monitor for fever or worsening pain.  If you need refills for medications you take chronically, please call your pharmacy. Do not use My Chart to request refills or for acute issues that need immediate attention. If you send a my chart message, it may take a few days to be addressed, specially if I am not in the office.  Please be sure medication list is accurate. If a new problem present, please set up appointment sooner than planned today.

## 2024-02-05 ENCOUNTER — Telehealth: Payer: Self-pay | Admitting: Family Medicine

## 2024-02-05 DIAGNOSIS — L02414 Cutaneous abscess of left upper limb: Secondary | ICD-10-CM

## 2024-02-05 DIAGNOSIS — S59912A Unspecified injury of left forearm, initial encounter: Secondary | ICD-10-CM

## 2024-02-05 NOTE — Telephone Encounter (Signed)
 Copied from CRM [id number]. Topic: Referral - Status >> Feb 05, 2024  1:00 PM Corin V wrote: Reason for CRM: Merlynn from [facility]. The referral for the patient abscess was reviewed and doctors there recommended patient see an ortho provider for this issue.

## 2024-02-05 NOTE — Telephone Encounter (Signed)
 New referral entered & sent as urgent.

## 2024-02-06 ENCOUNTER — Telehealth: Payer: Self-pay

## 2024-02-06 NOTE — Telephone Encounter (Signed)
 I called and spoke with pt. He is aware about appt on Monday at 8am. Address & phone number given to pt.

## 2024-02-06 NOTE — Telephone Encounter (Signed)
 Copied from CRM [id number]. Topic: Referral - Question >> Feb 06, 2024  8:50 AM Aleatha BROCKS wrote: Reason for CRM: Patient called [facility] about ortho and they state they don't do it there and would like to know where he should go

## 2024-02-08 ENCOUNTER — Telehealth: Payer: Self-pay

## 2024-02-08 NOTE — Telephone Encounter (Signed)
 Tried calling patient 3 times to r/s appointment for Monday. No answer- left 2 voice mails

## 2024-02-08 NOTE — Telephone Encounter (Signed)
 Lvm again to r/s

## 2024-02-09 ENCOUNTER — Other Ambulatory Visit: Payer: Self-pay | Admitting: Family Medicine

## 2024-02-09 DIAGNOSIS — G4700 Insomnia, unspecified: Secondary | ICD-10-CM

## 2024-02-09 DIAGNOSIS — G8929 Other chronic pain: Secondary | ICD-10-CM

## 2024-02-11 ENCOUNTER — Ambulatory Visit (INDEPENDENT_AMBULATORY_CARE_PROVIDER_SITE_OTHER): Admitting: Orthopedic Surgery

## 2024-02-11 ENCOUNTER — Ambulatory Visit: Admitting: Orthopedic Surgery

## 2024-02-11 ENCOUNTER — Other Ambulatory Visit: Payer: Self-pay

## 2024-02-11 ENCOUNTER — Encounter: Payer: Self-pay | Admitting: Orthopedic Surgery

## 2024-02-11 DIAGNOSIS — L02414 Cutaneous abscess of left upper limb: Secondary | ICD-10-CM | POA: Diagnosis not present

## 2024-02-11 MED ORDER — MUPIROCIN 2 % EX OINT: 1.0000 | TOPICAL_OINTMENT | Freq: Two times a day (BID) | CUTANEOUS | 3 refills | Status: AC

## 2024-02-11 MED ORDER — DOXYCYCLINE HYCLATE 100 MG PO TABS: 100.0000 mg | ORAL_TABLET | Freq: Two times a day (BID) | ORAL | 0 refills | Status: DC

## 2024-02-11 NOTE — Progress Notes (Signed)
 Office Visit Note   Patient: Matthew [name]           Date of Birth: Nov 22, 1965           MRN: [id number] Visit Date: 02/11/2024              Requested by: [place], Betty G, MD [address] [place],  [place] [address] PCP: [place], Betty G, MD  Chief Complaint  Patient presents with   Left Forearm - Wound Check      HPI: Patient is a 58 year old gentleman who is seen for initial evaluation for abscess left forearm.  Patient states he cut his arm about 3 weeks ago.  Patient states initially had a golf ball size of infection and he states since that time it is gone down.  Patient was on Bactrim  DS.  Assessment & Plan: Visit Diagnoses:  1. Abscess of forearm, left     Plan: Prescription was provided for doxycycline  and Bactroban topical ointment.  Follow-Up Instructions: Return in about 2 weeks (around 02/25/2024).   Ortho Exam  Patient is alert, oriented, no adenopathy, well-dressed, normal affect, normal respiratory effort. Examination patient has a resolving infection in the left forearm.  There is 2 small open wounds that were debrided there was serosanguineous drainage no evidence of abscess.  There is no tenderness to palpation no surrounding cellulitis.  There is area beneath the forearm of what appears to be a resolving hematoma.    Imaging: No results found.   Labs: Lab Results  Component Value Date   HGBA1C 5.5 01/21/2024   HGBA1C 5.5 08/06/2023   HGBA1C 6.1 11/20/2022   REPTSTATUS 01/20/2012 FINAL 01/18/2012   CULT NO GROWTH 01/18/2012     Lab Results  Component Value Date   ALBUMIN 4.4 08/06/2023   ALBUMIN 4.5 11/20/2022   ALBUMIN 4.4 05/29/2022    No results found for: MG No results found for: VD25OH  No results found for: PREALBUMIN    Latest Ref Rng & Units 05/29/2022   12:05 PM 05/25/2014   10:05 AM 01/09/2014   10:09 AM  CBC EXTENDED  WBC 4.0 - 10.5 K/uL 10.3  12.3  13.1   RBC 4.22 - 5.81 MIL/uL 5.35  5.77  5.37    Hemoglobin 13.0 - 17.0 g/dL 82.8  82.1  83.1   HCT 39.0 - 52.0 % 47.8  54.3 Repeated and verified X2.  49.6   Platelets 150 - 400 K/uL 292  291.0  316.0   NEUT# 1.7 - 7.7 K/uL 4.7  6.8  5.8   Lymph# 0.7 - 4.0 K/uL 4.8  4.4  6.1      There is no height or weight on file to calculate BMI.  Orders:  No orders of the defined types were placed in this encounter.  Meds ordered this encounter  Medications   doxycycline  (VIBRA -TABS) 100 MG tablet    Sig: Take 1 tablet (100 mg total) by mouth 2 (two) times daily.    Dispense:  20 tablet    Refill:  0   mupirocin ointment (BACTROBAN) 2 %    Sig: Apply 1 Application topically 2 (two) times daily. Apply to the affected area 2 times a day    Dispense:  22 g    Refill:  3     Procedures: No procedures performed  Clinical Data: No additional findings.  ROS:  All other systems negative, except as noted in the HPI. Review of Systems  Objective: Vital Signs: There  were no vitals taken for this visit.  Specialty Comments:  No specialty comments available.  PMFS History: Patient Active Problem List   Diagnosis Date Noted   Obesity with body mass index (BMI) of 30.0 to 39.9 11/20/2022   Erectile dysfunction 03/14/2022   Pain in lower back 03/14/2022   Whiplash 04/17/2018   Chronic pain disorder 01/30/2017   Elevated transaminase level 01/30/2017   Insomnia 04/10/2016   OSA (obstructive sleep apnea) 04/10/2016   BPH associated with nocturia 07/16/2015   GERD (gastroesophageal reflux disease) 07/16/2015   OA (osteoarthritis) 07/16/2015   Hypogonadism, male 02/21/2013   Type 2 diabetes mellitus with other specified complication (HCC) 01/24/2013   Hyperlipidemia associated with type 2 diabetes mellitus (HCC) 01/24/2013   Anxiety disorder 01/24/2013   Depression 01/24/2013   Past Medical History:  Diagnosis Date   Anxiety    Depression    Diabetes mellitus    GERD (gastroesophageal reflux disease)    H/O hiatal hernia     ?   Mental disorder    OSA (obstructive sleep apnea)    Not wearing CPAP   Parotid mass    rt   Sleep apnea    just started on cpap 2 weeks   Stones in the urinary tract     Family History  Problem Relation Age of Onset   Cancer Mother        breast   Mental illness Mother    Diabetes Father    Stroke Brother     Past Surgical History:  Procedure Laterality Date   HEMATOMA EVACUATION  07/04/2012   Procedure: EVACUATION HEMATOMA;  Surgeon: Vaughan Ricker, MD;  Location[facility];  Service: ENT;  Laterality: Right;   PAROTIDECTOMY  07/04/2012   Procedure: PAROTIDECTOMY;  Surgeon: Vaughan Ricker, MD;  Location[facility] OR;  Service: ENT;  Laterality: Right;  right parotidectomy   Social History   Occupational History   Not on file  Tobacco Use   Smoking status: Former    Current packs/day: 0.00    Average packs/day: 0.3 packs/day for 5.0 years (1.3 ttl pk-yrs)    Types: Cigarettes    Start date: 05/29/2011    Quit date: 05/28/2016    Years since quitting: 7.7   Smokeless tobacco: Former    Types: Chew   Tobacco comments:    marijuana 06/26/12  occ social drinker  Vaping Use   Vaping status: Never Used  Substance and Sexual Activity   Alcohol  use: Yes    Comment: monthly   Drug use: Yes    Frequency: 1.0 times per week    Types: Marijuana    Comment: denies recent use   Sexual activity: Not on file

## 2024-02-27 ENCOUNTER — Other Ambulatory Visit: Payer: Self-pay | Admitting: Family Medicine

## 2024-02-27 DIAGNOSIS — G894 Chronic pain syndrome: Secondary | ICD-10-CM

## 2024-02-27 DIAGNOSIS — M1990 Unspecified osteoarthritis, unspecified site: Secondary | ICD-10-CM

## 2024-02-28 ENCOUNTER — Ambulatory Visit: Admitting: Orthopedic Surgery

## 2024-03-20 ENCOUNTER — Telehealth: Payer: Self-pay

## 2024-03-20 NOTE — Telephone Encounter (Signed)
 Left voicemail to call office back to see if referral was completed.   Curtistine Quiet, CMA

## 2024-04-30 ENCOUNTER — Other Ambulatory Visit: Payer: Self-pay | Admitting: Family Medicine

## 2024-04-30 DIAGNOSIS — G4700 Insomnia, unspecified: Secondary | ICD-10-CM

## 2024-05-05 ENCOUNTER — Ambulatory Visit: Payer: Self-pay | Admitting: Family Medicine

## 2024-05-05 ENCOUNTER — Encounter: Payer: Self-pay | Admitting: Family Medicine

## 2024-05-05 ENCOUNTER — Ambulatory Visit (INDEPENDENT_AMBULATORY_CARE_PROVIDER_SITE_OTHER): Admitting: Family Medicine

## 2024-05-05 VITALS — BP 122/76 | HR 97 | Temp 98.5°F | Resp 16 | Ht 71.0 in | Wt 222.6 lb

## 2024-05-05 DIAGNOSIS — K219 Gastro-esophageal reflux disease without esophagitis: Secondary | ICD-10-CM

## 2024-05-05 DIAGNOSIS — Z125 Encounter for screening for malignant neoplasm of prostate: Secondary | ICD-10-CM | POA: Diagnosis not present

## 2024-05-05 DIAGNOSIS — F419 Anxiety disorder, unspecified: Secondary | ICD-10-CM | POA: Diagnosis not present

## 2024-05-05 DIAGNOSIS — Z23 Encounter for immunization: Secondary | ICD-10-CM | POA: Diagnosis not present

## 2024-05-05 DIAGNOSIS — G894 Chronic pain syndrome: Secondary | ICD-10-CM

## 2024-05-05 DIAGNOSIS — G4700 Insomnia, unspecified: Secondary | ICD-10-CM | POA: Diagnosis not present

## 2024-05-05 DIAGNOSIS — E1169 Type 2 diabetes mellitus with other specified complication: Secondary | ICD-10-CM | POA: Diagnosis not present

## 2024-05-05 LAB — POCT GLYCOSYLATED HEMOGLOBIN (HGB A1C): Hemoglobin A1C: 5.2 % (ref 4.0–5.6)

## 2024-05-05 LAB — BASIC METABOLIC PANEL WITH GFR
BUN: 22 mg/dL (ref 6–23)
CO2: 25 meq/L (ref 19–32)
Calcium: 9.9 mg/dL (ref 8.4–10.5)
Chloride: 104 meq/L (ref 96–112)
Creatinine, Ser: 0.97 mg/dL (ref 0.40–1.50)
GFR: 85.17 mL/min (ref >60.00)
Glucose, Bld: 98 mg/dL (ref 70–99)
Potassium: 4.3 meq/L (ref 3.5–5.1)
Sodium: 138 meq/L (ref 135–145)

## 2024-05-05 LAB — PSA: PSA: 1.18 ng/mL (ref 0.10–4.00)

## 2024-05-05 MED ORDER — OMEPRAZOLE 20 MG PO CPDR: 20.0000 mg | DELAYED_RELEASE_CAPSULE | Freq: Every day | ORAL | 3 refills | Status: AC

## 2024-05-05 MED ORDER — OMEPRAZOLE 20 MG PO CPDR: 20.0000 mg | DELAYED_RELEASE_CAPSULE | Freq: Every day | ORAL | 3 refills | Status: DC

## 2024-05-05 NOTE — Progress Notes (Signed)
 Chief Complaint  Patient presents with   Medical Management of Chronic Issues   Discussed the use of AI scribe software for clinical note transcription with the patient, who gave verbal consent to proceed. History of Present Illness Matthew [name] is a 58 year old male with past medical history significant for  hyperlipidemia, DM 2, chronic pain, and anxiety here today with his mother for chronic disease management. Last seen on 02/04/24. Since his last visit he followed with Dr Harden for left forearm abscess, which ahs resolved.  Diabetes Mellitus II:  Dx'ed about 9 years ago.  He has been managing his diabetes well, with stable blood sugar levels averaging 114 mg/dL and no episodes of hypoglycemia. He has been taking metformin  500 mg twice daily and Ozempic  1 mg weekly, with no side effects. He is careful with his diet and engages in walking for exercise. Lab Results  Component Value Date   HGBA1C 5.5 01/21/2024   Insomnia and anxiety:He reports improved sleep since starting Trazodone  50 mg at bedtime, taking one tablet as needed, and continues to use clonazepam  2 mg at bedtime as needed. His mother  has expressed concern about potential bipolar disorder due to his irritability.  He does not wish to pursue psychiatric evaluation at this time. He sleeps approximately four hours per night and feels rested upon waking. He denies engaging in risky behavior.   GERD: He experiences occasional heartburn since he stopped taking omeprazole  20 mg daily, which was effective in managing his symptoms.   Chronic pain: Severe achy ankle pain, mainly left, status post surgery.  He takes meloxicam  15 mg daily as needed for pain, which helps.  No recent changes in urinary frequency or other urinary symptoms.  Lab Results  Component Value Date   CREATININE 0.87 01/21/2024   BUN 18 01/21/2024   NA 140 01/21/2024   K 4.7 01/21/2024   CL 104 01/21/2024   CO2 29 01/21/2024   Review of Systems   Constitutional:  Negative for activity change, appetite change, chills and fever.  HENT:  Negative for sore throat.   Respiratory:  Negative for cough, shortness of breath and wheezing.   Cardiovascular:  Negative for chest pain, palpitations and leg swelling.  Gastrointestinal:  Negative for abdominal pain, nausea and vomiting.  Endocrine: Negative for cold intolerance, heat intolerance, polydipsia, polyphagia and polyuria.  Genitourinary:  Negative for decreased urine volume, dysuria and hematuria.  Skin:  Negative for rash.  Neurological:  Negative for syncope, weakness and headaches.  Psychiatric/Behavioral:  Negative for confusion and hallucinations.   See other pertinent positives and negatives in HPI.  Current Outpatient Medications on File Prior to Visit  Medication Sig Dispense Refill   aspirin 81 MG tablet Take 81 mg by mouth daily.     Blood Glucose Monitoring Suppl ([employer] BLOOD GLUCOSE METER) w/Device KIT Use to check blood sugars daily. 1 kit 0   clonazePAM  (KLONOPIN ) 2 MG tablet TAKE 1 TABLET BY MOUTH AT BEDTIME AS NEEDED FOR ANXIETY 30 tablet 3   glucose blood (ONETOUCH ULTRA) test strip Use to check blood sugars 1-2 times daily. 100 each 12   Insulin  Pen Needle 32G X 4 MM MISC To use with insulin . 100 each 3   meloxicam  (MOBIC ) 15 MG tablet TAKE 1 TABLET BY MOUTH EVERY DAY 90 tablet 1   mupirocin  ointment (BACTROBAN ) 2 % Apply 1 Application topically 2 (two) times daily. Apply to the affected area 2 times a day 22  g 3   OneTouch UltraSoft 2 Lancets MISC 1 each by Does not apply route daily as needed. Use to check blood sugars 1-2 times daily. 100 each 3   rosuvastatin  (CRESTOR ) 20 MG tablet Take 1 tablet (20 mg total) by mouth daily. 90 tablet 2   Semaglutide , 1 MG/DOSE, (OZEMPIC , 1 MG/DOSE,) 4 MG/3ML SOPN Inject 1 mg into the skin once a week. 6 mL 3   sildenafil  (VIAGRA ) 100 MG tablet Take 1 tablet (100 mg total) by mouth daily as needed for erectile dysfunction. 20 tablet 5    tiZANidine  (ZANAFLEX ) 4 MG tablet TAKE 1 TABLET BY MOUTH DAILY AS NEEDED FOR MUSCLE SPASMS. 90 tablet 1   traZODone  (DESYREL ) 50 MG tablet TAKE 1-2 TABLETS BY MOUTH AT BEDTIME. 180 tablet 1   No current facility-administered medications on file prior to visit.    Past Medical History:  Diagnosis Date   Anxiety    Depression    Diabetes mellitus    GERD (gastroesophageal reflux disease)    H/O hiatal hernia    ?   Mental disorder    OSA (obstructive sleep apnea)    Not wearing CPAP   Parotid mass    rt   Sleep apnea    just started on cpap 2 weeks   Stones in the urinary tract     No Known Allergies  Social History   Socioeconomic History   Marital status: Single    Spouse name: Not on file   Number of children: Not on file   Years of education: Not on file   Highest education level: Not on file  Occupational History   Not on file  Tobacco Use   Smoking status: Former    Current packs/day: 0.00    Average packs/day: 0.3 packs/day for 5.0 years (1.3 ttl pk-yrs)    Types: Cigarettes    Start date: 05/29/2011    Quit date: 05/28/2016    Years since quitting: 7.9   Smokeless tobacco: Former    Types: Chew   Tobacco comments:    marijuana 06/26/12  occ social drinker  Vaping Use   Vaping status: Never Used  Substance and Sexual Activity   Alcohol  use: Yes    Comment: monthly   Drug use: Yes    Frequency: 1.0 times per week    Types: Marijuana    Comment: denies recent use   Sexual activity: Not on file  Other Topics Concern   Not on file  Social History Narrative   Not on file   Social Drivers of Health   Financial Resource Strain: Not on file  Food Insecurity: Not on file  Transportation Needs: Not on file  Physical Activity: Not on file  Stress: Not on file  Social Connections: Not on file    Today's Vitals   05/05/24 0810  BP: 122/76  Pulse: 97  Resp: 16  Temp: 98.5 F (36.9 C)  TempSrc: Oral  SpO2: 96%  Weight: 222 lb 9.6 oz (101 kg)    Wt Readings from Last 3 Encounters:  05/05/24 222 lb 9.6 oz (101 kg)  02/04/24 221 lb (100.2 kg)  01/21/24 223 lb 6 oz (101.3 kg)   Body mass index is 31.05 kg/m.  Physical Exam Vitals and nursing note reviewed.  Constitutional:      General: He is not in acute distress.    Appearance: He is well-developed.  HENT:     Head: Normocephalic and atraumatic.     Mouth/Throat:  Mouth: Mucous membranes are moist.     Pharynx: Oropharynx is clear. Uvula midline.  Eyes:     Conjunctiva/sclera: Conjunctivae normal.  Cardiovascular:     Rate and Rhythm: Normal rate and regular rhythm.     Pulses:          Dorsalis pedis pulses are 2+ on the right side and 2+ on the left side.     Heart sounds: No murmur heard. Pulmonary:     Effort: Pulmonary effort is normal. No respiratory distress.     Breath sounds: Normal breath sounds.  Abdominal:     Palpations: Abdomen is soft. There is no hepatomegaly or mass.     Tenderness: There is no abdominal tenderness.  Musculoskeletal:     Right lower leg: No edema.     Left lower leg: No edema.  Lymphadenopathy:     Cervical: No cervical adenopathy.  Skin:    General: Skin is warm.     Findings: No erythema or rash.  Neurological:     Mental Status: He is alert and oriented to person, place, and time.     Cranial Nerves: No cranial nerve deficit.     Gait: Gait normal.  Psychiatric:        Mood and Affect: Mood and affect normal.   ASSESSMENT AND PLAN:  [name] was seen today for medical management of chronic issues.  Diagnoses and all orders for this visit:  Orders Placed This Encounter  Procedures   Basic metabolic panel with GFR   PSA   Microalbumin / creatinine urine ratio   POC HgB A1c   Lab Results  Component Value Date   HGBA1C 5.2 05/05/2024   No results found for: [place]  Lab Results  Component Value Date   PSA 1.18 05/05/2024   PSA 1.16 02/21/2013   Lab Results  Component Value  Date   NA 138 05/05/2024   CL 104 05/05/2024   K 4.3 05/05/2024   CO2 25 05/05/2024   BUN 22 05/05/2024   CREATININE 0.97 05/05/2024   GFR 85.17 05/05/2024   CALCIUM  9.9 05/05/2024   ALBUMIN 4.4 08/06/2023   GLUCOSE 98 05/05/2024  Type 2 diabetes mellitus with other specified complication, without long-term current use of insulin  (HCC) Assessment & Plan: Problem is well controlled, HgA1C improved, it went from 5.5 to 5.2. Recommend stopping Metformin . Continue Ozempic  1 mg weekly. Continue appropriate foot care and periodic dental and eye exams. F/U in 5 months.  Orders: -     POCT glycosylated hemoglobin (Hb A1C) -     Basic metabolic panel with GFR; Future -     Microalbumin / creatinine urine ratio; Future  Insomnia, unspecified type Assessment & Plan: He reports improvement, sleeping 4 hours at night. His mother raised concerns about possible bipolar disorder, he is not interested in screening at this time. Continue trazodone  50 mg daily at bedtime, can take 2 tablets if needed. Continue good sleep hygiene.   Anxiety disorder, unspecified type Assessment & Plan: Reports problem is well-controlled. Continue clonazepam  2 mg daily as needed and trazodone  50 mg at bedtime, the latter one she also takes for insomnia.   Gastroesophageal reflux disease without esophagitis Assessment & Plan: Heartburn since stopping omeprazole  20 mg, she will resume medication, take it 30-minute before breakfast. Continue GERD precautions.  Orders: -     Omeprazole ; Take 1 capsule (20 mg total) by mouth daily before breakfast. TAKE 1 CAPSULE BY MOUTH EVERY DAY  Dispense: 90 capsule; Refill: 3  Chronic pain disorder Assessment & Plan: Stable. Continue with Meloxicam  15 mg daily prn. We have discussed some side effects of chronic NSAID's use. Follow-up in 6 months, before if needed.   Prostate cancer screening -     PSA; Future  Need for pneumococcal vaccination -     Pneumococcal  conjugate vaccine 20-valent  Need for influenza vaccination -     Flu vaccine trivalent PF, 6mos and older(Flulaval,Afluria,Fluarix,Fluzone)   I personally spent a total of 44 minutes in the care of the patient today including preparing to see the patient, getting/reviewing separately obtained history, performing a medically appropriate exam/evaluation, counseling and educating, placing orders, documenting clinical information in the EHR, and communicating results.  Return in about 6 months (around 11/02/2024) for chronic problems.  Swayze Pries [place], MD [facility]. [place] office.

## 2024-05-05 NOTE — Assessment & Plan Note (Signed)
 He reports improvement, sleeping 4 hours at night. His mother raised concerns about possible bipolar disorder, he is not interested in screening at this time. Continue trazodone  50 mg daily at bedtime, can take 2 tablets if needed. Continue good sleep hygiene.

## 2024-05-05 NOTE — Assessment & Plan Note (Signed)
 Stable. Continue with Meloxicam  15 mg daily prn. We have discussed some side effects of chronic NSAID's use. Follow-up in 6 months, before if needed.

## 2024-05-05 NOTE — Assessment & Plan Note (Signed)
 Reports problem is well-controlled. Continue clonazepam  2 mg daily as needed and trazodone  50 mg at bedtime, the latter one she also takes for insomnia.

## 2024-05-05 NOTE — Assessment & Plan Note (Signed)
 Heartburn since stopping omeprazole  20 mg, she will resume medication, take it 30-minute before breakfast. Continue GERD precautions.

## 2024-05-05 NOTE — Assessment & Plan Note (Signed)
 Problem is well controlled, HgA1C improved, it went from 5.5 to 5.2. Recommend stopping Metformin . Continue Ozempic  1 mg weekly. Continue appropriate foot care and periodic dental and eye exams. F/U in 5 months.

## 2024-05-05 NOTE — Patient Instructions (Addendum)
 A few things to remember from today's visit:  Type 2 diabetes mellitus with other specified complication, without long-term current use of insulin  (HCC) - Plan: POC HgB A1c, Basic metabolic panel with GFR, Microalbumin / creatinine urine ratio  Insomnia, unspecified type  Anxiety disorder, unspecified type  Gastroesophageal reflux disease without esophagitis - Plan: omeprazole  (PRILOSEC) 20 MG capsule  Prostate cancer screening - Plan: PSA  Stop Metformin . Continue Ozempic . Rest no changes.  If you need refills for medications you take chronically, please call your pharmacy. Do not use My Chart to request refills or for acute issues that need immediate attention. If you send a my chart message, it may take a few days to be addressed, specially if I am not in the office.  Please be sure medication list is accurate. If a new problem present, please set up appointment sooner than planned today.

## 2024-05-20 ENCOUNTER — Other Ambulatory Visit: Payer: Self-pay | Admitting: Family Medicine

## 2024-05-20 DIAGNOSIS — G4700 Insomnia, unspecified: Secondary | ICD-10-CM

## 2024-05-20 DIAGNOSIS — F419 Anxiety disorder, unspecified: Secondary | ICD-10-CM

## 2024-05-23 ENCOUNTER — Other Ambulatory Visit: Payer: Self-pay

## 2024-05-23 DIAGNOSIS — N529 Male erectile dysfunction, unspecified: Secondary | ICD-10-CM

## 2024-05-23 MED ORDER — SILDENAFIL CITRATE 100 MG PO TABS: 100.0000 mg | ORAL_TABLET | Freq: Every day | ORAL | 5 refills | Status: DC | PRN

## 2024-05-23 NOTE — Progress Notes (Signed)
 Requested by patient: refill on Sildenafil  100mg  sent to pharmacy.

## 2024-06-09 ENCOUNTER — Other Ambulatory Visit: Payer: Self-pay

## 2024-06-09 ENCOUNTER — Emergency Department (HOSPITAL_COMMUNITY)
Admission: EM | Admit: 2024-06-09 | Discharge: 2024-06-09 | Disposition: A | Discharge status: Home / Self Care | Payer: Worker's Compensation | Source: Ambulatory Visit | Attending: Emergency Medicine | Admitting: Emergency Medicine

## 2024-06-09 ENCOUNTER — Encounter (HOSPITAL_COMMUNITY): Payer: Self-pay | Admitting: Emergency Medicine

## 2024-06-09 ENCOUNTER — Emergency Department (HOSPITAL_COMMUNITY): Payer: Worker's Compensation

## 2024-06-09 DIAGNOSIS — Z7984 Long term (current) use of oral hypoglycemic drugs: Secondary | ICD-10-CM | POA: Insufficient documentation

## 2024-06-09 DIAGNOSIS — Z7982 Long term (current) use of aspirin: Secondary | ICD-10-CM | POA: Insufficient documentation

## 2024-06-09 DIAGNOSIS — E119 Type 2 diabetes mellitus without complications: Secondary | ICD-10-CM | POA: Diagnosis not present

## 2024-06-09 DIAGNOSIS — M545 Low back pain, unspecified: Secondary | ICD-10-CM | POA: Diagnosis not present

## 2024-06-09 DIAGNOSIS — R519 Headache, unspecified: Secondary | ICD-10-CM | POA: Diagnosis present

## 2024-06-09 DIAGNOSIS — Z794 Long term (current) use of insulin: Secondary | ICD-10-CM | POA: Diagnosis not present

## 2024-06-09 MED ORDER — METHOCARBAMOL 500 MG PO TABS: 500.0000 mg | ORAL_TABLET | Freq: Two times a day (BID) | ORAL | 0 refills | Status: AC
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
## 2024-06-09 NOTE — Discharge Instructions (Signed)
 You were seen in the ER today for concerns of back pain and a headache. Your labs and imaging were reassuring with no abnormal findings seen.  I would recommend taking Tylenol  ibuprofen for pain as needed.  I sent a prescription for a medication called Robaxin to help with your back pain.  For any concerns of new or worsening symptoms, return to the emergency department.

## 2024-06-16 ENCOUNTER — Encounter: Payer: Self-pay | Admitting: Radiology

## 2024-06-27 ENCOUNTER — Other Ambulatory Visit: Payer: Self-pay | Admitting: Family Medicine

## 2024-06-27 DIAGNOSIS — E1169 Type 2 diabetes mellitus with other specified complication: Secondary | ICD-10-CM

## 2024-06-27 DIAGNOSIS — G4700 Insomnia, unspecified: Secondary | ICD-10-CM

## 2024-07-02 ENCOUNTER — Other Ambulatory Visit: Payer: Self-pay | Admitting: Family Medicine

## 2024-07-02 DIAGNOSIS — G4700 Insomnia, unspecified: Secondary | ICD-10-CM

## 2024-07-21 ENCOUNTER — Other Ambulatory Visit: Payer: Self-pay | Admitting: Family Medicine

## 2024-07-21 DIAGNOSIS — E1169 Type 2 diabetes mellitus with other specified complication: Secondary | ICD-10-CM

## 2024-08-16 ENCOUNTER — Other Ambulatory Visit: Payer: Self-pay | Admitting: Family Medicine

## 2024-08-16 DIAGNOSIS — G894 Chronic pain syndrome: Secondary | ICD-10-CM

## 2024-08-16 DIAGNOSIS — M1990 Unspecified osteoarthritis, unspecified site: Secondary | ICD-10-CM

## 2024-09-04 ENCOUNTER — Other Ambulatory Visit: Payer: Self-pay | Admitting: Family Medicine

## 2024-09-04 DIAGNOSIS — N529 Male erectile dysfunction, unspecified: Secondary | ICD-10-CM
